# Patient Record
Sex: Male | Born: 1970
Health system: Southern US, Community
[De-identification: ages and names within clinical notes are randomized; demographics above are authoritative.]

## PROBLEM LIST (undated history)

## (undated) DIAGNOSIS — E119 Type 2 diabetes mellitus without complications: Secondary | ICD-10-CM

## (undated) DIAGNOSIS — F419 Anxiety disorder, unspecified: Secondary | ICD-10-CM

## (undated) DIAGNOSIS — M79606 Pain in leg, unspecified: Secondary | ICD-10-CM

## (undated) DIAGNOSIS — M549 Dorsalgia, unspecified: Secondary | ICD-10-CM

## (undated) DIAGNOSIS — K219 Gastro-esophageal reflux disease without esophagitis: Secondary | ICD-10-CM

## (undated) DIAGNOSIS — M1990 Unspecified osteoarthritis, unspecified site: Secondary | ICD-10-CM

## (undated) DIAGNOSIS — I10 Essential (primary) hypertension: Secondary | ICD-10-CM

## (undated) HISTORY — DX: Type 2 diabetes mellitus without complications: E11.9

## (undated) HISTORY — DX: Gastro-esophageal reflux disease without esophagitis: K21.9

## (undated) HISTORY — PX: VASECTOMY: SHX75

## (undated) HISTORY — DX: Dorsalgia, unspecified: M54.9

## (undated) HISTORY — DX: Anxiety disorder, unspecified: F41.9

---
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
## 1998-02-02 ENCOUNTER — Encounter: Payer: Self-pay | Admitting: Emergency Medicine

## 1998-02-02 ENCOUNTER — Inpatient Hospital Stay (HOSPITAL_COMMUNITY): Admission: EM | Admit: 1998-02-02 | Discharge: 1998-02-03 | Payer: Self-pay | Admitting: Emergency Medicine

## 1998-02-08 ENCOUNTER — Emergency Department (HOSPITAL_COMMUNITY): Admission: EM | Admit: 1998-02-08 | Discharge: 1998-02-08 | Payer: Self-pay | Admitting: Emergency Medicine

## 2011-03-26 ENCOUNTER — Ambulatory Visit (INDEPENDENT_AMBULATORY_CARE_PROVIDER_SITE_OTHER): Payer: BC Managed Care – PPO

## 2011-03-26 DIAGNOSIS — J069 Acute upper respiratory infection, unspecified: Secondary | ICD-10-CM

## 2011-03-26 DIAGNOSIS — B9789 Other viral agents as the cause of diseases classified elsewhere: Secondary | ICD-10-CM

## 2011-03-26 DIAGNOSIS — Z23 Encounter for immunization: Secondary | ICD-10-CM

## 2011-05-19 ENCOUNTER — Ambulatory Visit (INDEPENDENT_AMBULATORY_CARE_PROVIDER_SITE_OTHER): Payer: BC Managed Care – PPO | Admitting: Family Medicine

## 2011-05-19 VITALS — BP 138/88 | HR 78 | Temp 98.6°F | Resp 16 | Ht 64.5 in | Wt 192.0 lb

## 2011-05-19 DIAGNOSIS — H1030 Unspecified acute conjunctivitis, unspecified eye: Secondary | ICD-10-CM

## 2011-05-19 DIAGNOSIS — H00019 Hordeolum externum unspecified eye, unspecified eyelid: Secondary | ICD-10-CM

## 2011-05-19 MED ORDER — ERYTHROMYCIN 5 MG/GM OP OINT: TOPICAL_OINTMENT | Freq: Four times a day (QID) | OPHTHALMIC | Status: AC

## 2011-05-19 NOTE — Patient Instructions (Signed)
Sty  A sty (hordeolum) is an infection of a gland in the eyelid located at the base of the eyelash. A sty may develop a white or yellow head of pus. It can be puffy (swollen). Usually, the sty will burst and pus will come out on its own. They do not leave lumps in the eyelid once they drain.  A sty is often confused with another form of cyst of the eyelid called a chalazion. Chalazions occur within the eyelid and not on the edge where the bases of the eyelashes are. They often are red, sore and then form firm lumps in the eyelid.  CAUSES    Germs (bacteria).   Lasting (chronic) eyelid inflammation.  SYMPTOMS    Tenderness, redness and swelling along the edge of the eyelid at the base of the eyelashes.   Sometimes, there is a white or yellow head of pus. It may or may not drain.  DIAGNOSIS   An ophthalmologist will be able to distinguish between a sty and a chalazion and treat the condition appropriately.   TREATMENT    Styes are typically treated with warm packs (compresses) until drainage occurs.   In rare cases, medicines that kill germs (antibiotics) may be prescribed. These antibiotics may be in the form of drops, cream or pills.   If a hard lump has formed, it is generally necessary to do a small incision and remove the hardened contents of the cyst in a minor surgical procedure done in the office.   In suspicious cases, your caregiver may send the contents of the cyst to the lab to be certain that it is not a rare, but dangerous form of cancer of the glands of the eyelid.  HOME CARE INSTRUCTIONS    Wash your hands often and dry them with a clean towel. Avoid touching your eyelid. This may spread the infection to other parts of the eye.   Apply heat to your eyelid for 10 to 20 minutes, several times a day, to ease pain and help to heal it faster.   Do not squeeze the sty. Allow it to drain on its own. Wash your eyelid carefully 3 to 4 times per day to remove any pus.  SEEK IMMEDIATE MEDICAL CARE IF:     Your eye becomes painful or puffy (swollen).   Your vision changes.   Your sty does not drain by itself within 3 days.   Your sty comes back within a short period of time, even with treatment.   You have redness (inflammation) around the eye.   You have a fever.  Document Released: 12/12/2004 Document Revised: 02/21/2011 Document Reviewed: 08/16/2008  [employer] Patient Information 2012 [employer]

## 2011-05-19 NOTE — Progress Notes (Signed)
  Subjective:    Patient ID: Isaac Hall, male    DOB: [birth year], 41 y.o.   MRN: [id number]  HPI Isaac Hall is a 41 y.o. male Started 4 days ago - R eye pain, swollen in am each day since.  Swelling improves during day.  Minimal watering in am.  No fever.  Slight headache.  No visual changes.    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 No prior hx of FB.  Tx: otc ointment for stye   Review of Systems  Eyes: Positive for pain and redness. Negative for photophobia, discharge and visual disturbance.       No amaurosis, no change in floaters.  Neurological: Positive for headaches.       Objective:   Physical Exam  Nursing note and vitals reviewed. Constitutional: He appears well-developed and well-nourished. No distress.  HENT:  Head: Normocephalic and atraumatic.  Eyes: EOM are normal. Pupils are equal, round, and reactive to light. No foreign bodies found. Right eye exhibits hordeolum. Right conjunctiva is injected. Left conjunctiva is not injected.       Minimal scleral injection on right, small pterygium at edge of cornea.  erythematous hordeolum on medial aspect R upper lid.  Proparacaine 2 gtts applied, lid everted, no fb.  No uptake with fluorescein, flushed with saline.   Pulmonary/Chest: Effort normal.  Skin: Skin is warm and dry.  Psychiatric: He has a normal mood and affect. His behavior is normal.        Assessment & Plan:   Isaac Hall is a 41 y.o. male With R upper lid hordeolum.  Minimal injection - reactive conjunctivitis vs. Irritative with rubbing.   Warm compresses QID +.  Eryhthromycin oint for next 5-7 days,  Small pterygium - has optho, s/p eval few months ago.  Stressed importance of sunglasses.

## 2011-08-10 ENCOUNTER — Ambulatory Visit (INDEPENDENT_AMBULATORY_CARE_PROVIDER_SITE_OTHER): Payer: BC Managed Care – PPO | Admitting: Family Medicine

## 2011-08-10 VITALS — BP 120/73 | HR 67 | Temp 98.1°F | Resp 16 | Ht 64.5 in | Wt 189.0 lb

## 2011-08-10 DIAGNOSIS — S39012A Strain of muscle, fascia and tendon of lower back, initial encounter: Secondary | ICD-10-CM

## 2011-08-10 DIAGNOSIS — M62838 Other muscle spasm: Secondary | ICD-10-CM

## 2011-08-10 DIAGNOSIS — IMO0002 Reserved for concepts with insufficient information to code with codable children: Secondary | ICD-10-CM

## 2011-08-10 DIAGNOSIS — M545 Low back pain, unspecified: Secondary | ICD-10-CM

## 2011-08-10 MED ORDER — NAPROXEN 500 MG PO TABS: 500.0000 mg | ORAL_TABLET | Freq: Two times a day (BID) | ORAL | Status: DC

## 2011-08-10 MED ORDER — PREDNISONE 20 MG PO TABS: ORAL_TABLET | ORAL | Status: AC

## 2011-08-10 MED ORDER — CYCLOBENZAPRINE HCL 10 MG PO TABS: 10.0000 mg | ORAL_TABLET | Freq: Three times a day (TID) | ORAL | Status: AC | PRN

## 2011-08-10 NOTE — Progress Notes (Signed)
  Subjective:    Patient ID: Isaac Hall, male    DOB: Aug 08, 1970, 41 y.o.   MRN: [id number]  HPI 41 yo male here with LBP.  Does a lot of physical work at his job, at work yesterday started hurting.  Hard to get comfortable last night.  Hurts in middle of back.  No radiation.  Worse standing up straight or moving too fast. No numbness, tingling, weakness.  No history of back problems.  Took ibuprofen and wife's muscle relaxer.  Helped some.     Review of Systems Negative except as per HPI     Objective:   Physical Exam  Constitutional: Vital signs are normal. He appears well-developed and well-nourished. He is active.  Non-toxic appearance. He does not appear ill.  Cardiovascular: Normal rate, regular rhythm, normal heart sounds and normal pulses.   Pulmonary/Chest: Effort normal and breath sounds normal.  Musculoskeletal:       Lumbar back: He exhibits decreased range of motion, tenderness and spasm. He exhibits no bony tenderness, no swelling and no deformity.       TTP over right lumbar paraspinal    Neurological: He is alert. He has normal strength. Gait normal.  Reflex Scores:      Patellar reflexes are 2+ on the right side and 2+ on the left side.      Achilles reflexes are 2+ on the right side and 2+ on the left side.         Assessment & Plan:  LBP Back strain Muscle spasm  Flexeril, Naproxen, prednisone taper.  Heat, ice.  NO lifting, bending over weekend but try to do gentle walking around.

## 2011-10-14 ENCOUNTER — Ambulatory Visit (INDEPENDENT_AMBULATORY_CARE_PROVIDER_SITE_OTHER): Payer: Self-pay | Admitting: Family Medicine

## 2011-10-14 ENCOUNTER — Ambulatory Visit: Payer: BC Managed Care – PPO

## 2011-10-14 VITALS — BP 128/92 | HR 80 | Temp 98.3°F | Resp 16 | Ht 64.5 in | Wt 190.0 lb

## 2011-10-14 DIAGNOSIS — M549 Dorsalgia, unspecified: Secondary | ICD-10-CM

## 2011-10-14 DIAGNOSIS — R5381 Other malaise: Secondary | ICD-10-CM

## 2011-10-14 DIAGNOSIS — M419 Scoliosis, unspecified: Secondary | ICD-10-CM

## 2011-10-14 DIAGNOSIS — M5416 Radiculopathy, lumbar region: Secondary | ICD-10-CM

## 2011-10-14 DIAGNOSIS — R5383 Other fatigue: Secondary | ICD-10-CM

## 2011-10-14 DIAGNOSIS — IMO0002 Reserved for concepts with insufficient information to code with codable children: Secondary | ICD-10-CM

## 2011-10-14 LAB — POCT CBC
HCT, POC: 48.8 % (ref 43.5–53.7)
Hemoglobin: 16.1 g/dL (ref 14.1–18.1)
MCH, POC: 29.9 pg (ref 27–31.2)
MPV: 10.4 fL (ref 0–99.8)
POC MID %: 6.5 %M (ref 0–12)
RBC: 5.38 M/uL (ref 4.69–6.13)
WBC: 6.1 10*3/uL (ref 4.6–10.2)

## 2011-10-14 LAB — COMPREHENSIVE METABOLIC PANEL
ALT: 64 U/L — ABNORMAL HIGH (ref 0–53)
AST: 34 U/L (ref 0–37)
Albumin: 4.7 g/dL (ref 3.5–5.2)
BUN: 12 mg/dL (ref 6–23)
Calcium: 9.8 mg/dL (ref 8.4–10.5)
Chloride: 104 mEq/L (ref 96–112)
Potassium: 4.5 mEq/L (ref 3.5–5.3)

## 2011-10-14 LAB — TESTOSTERONE: Testosterone: 196.16 ng/dL — ABNORMAL LOW (ref 300–890)

## 2011-10-14 LAB — LIPID PANEL: HDL: 29 mg/dL — ABNORMAL LOW (ref >39)

## 2011-10-14 MED ORDER — PREDNISONE 20 MG PO TABS: ORAL_TABLET | ORAL | Status: AC

## 2011-10-14 MED ORDER — HYDROCODONE-ACETAMINOPHEN 5-500 MG PO TABS: 1.0000 | ORAL_TABLET | ORAL | Status: AC | PRN

## 2011-10-14 NOTE — Progress Notes (Signed)
Subjective: 41 year old man who has a history of being treated earlier this spring for back pain. A few days ago it began in. He has not any idea of what the injury might have been. It hurts in his low back, down into his left buttock and down his left leg. 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 However he tries to do proper lifting. He does not have any history of curvature of his spine. He has been having a lot more fatigued. He just doesn't have the energy used 2. Tired all the time.Marland Kitchen He says maybe gets a little bit depressed at times. His wife does have MS. The sexual function is not what it used to be, upon her is having children and having a wife is not very able. The fatigue is nonspecific.  Objective: Short stature, moderately overweight, male in no acute distress. Throat clear. Neck supple without nodes or thyromegaly. Chest clear to auscultation. Heart regular without murmurs. Abdomen soft without mass or tenderness. Straight leg raising test positive at 40 on the left. He is spine is very limited motion, with a great deal of pain with any anterior flexion. There is asymmetry of the skin folds in his back, with a little scoliosis toward the left. This appears to be at the very lower lumbar area.  Assessment: Low back pain Lumbar radiculopathy Fatigue  Plan: Check labs because of his fatigue Results for orders placed in visit on 10/14/11  POCT CBC      Component Value Range   WBC 6.1  4.6 - 10.2 K/uL   Lymph, poc 1.6  0.6 - 3.4   POC LYMPH PERCENT 26.5  10 - 50 %L   MID (cbc) 0.4  0 - 0.9   POC MID % 6.5  0 - 12 %M   POC Granulocyte 4.1  2 - 6.9   Granulocyte percent 67.0  37 - 80 %G   RBC 5.38  4.69 - 6.13 M/uL   Hemoglobin 16.1  14.1 - 18.1 g/dL   HCT, POC 16.1  09.6 - 53.7 %   MCV 90.7  80 - 97 fL   MCH, POC 29.9  27 - 31.2 pg   MCHC 33.0  31.8 - 35.4 g/dL   RDW, POC 04.5     Platelet Count, POC 210  142 - 424 K/uL   MPV 10.4  0 - 99.8 fL  POCT GLYCOSYLATED  HEMOGLOBIN (HGB A1C)      Component Value Range   Hemoglobin A1C 6.1     Results for orders placed in visit on 10/14/11  POCT CBC      Component Value Range   WBC 6.1  4.6 - 10.2 K/uL   Lymph, poc 1.6  0.6 - 3.4   POC LYMPH PERCENT 26.5  10 - 50 %L   MID (cbc) 0.4  0 - 0.9   POC MID % 6.5  0 - 12 %M   POC Granulocyte 4.1  2 - 6.9   Granulocyte percent 67.0  37 - 80 %G   RBC 5.38  4.69 - 6.13 M/uL   Hemoglobin 16.1  14.1 - 18.1 g/dL   HCT, POC 40.9  81.1 - 53.7 %   MCV 90.7  80 - 97 fL   MCH, POC 29.9  27 - 31.2 pg   MCHC 33.0  31.8 - 35.4 g/dL   RDW, POC 91.4     Platelet Count, POC 210  142 - 424 K/uL  MPV 10.4  0 - 99.8 fL  POCT GLYCOSYLATED HEMOGLOBIN (HGB A1C)      Component Value Range   Hemoglobin A1C 6.1     [facility] reading (PRIMARY) by  Dr. Alwyn Ren Normal spine  Impression: Fatigue Pre-diabetes Lumbar radiculopathy.

## 2011-10-14 NOTE — Patient Instructions (Addendum)
Use the pain pills for severe pain only. Take the muscle relaxants pill at bedtime Take the prednisone as directed. If pain in the back continues to persist with radiation down the left leg please return because we would need to get additional testing done.  Weight loss and exercise

## 2011-10-15 ENCOUNTER — Encounter: Payer: Self-pay | Admitting: Family Medicine

## 2011-10-15 LAB — TSH: TSH: 2.627 u[IU]/mL (ref 0.350–4.500)

## 2012-04-23 ENCOUNTER — Ambulatory Visit (INDEPENDENT_AMBULATORY_CARE_PROVIDER_SITE_OTHER): Payer: BC Managed Care – PPO | Admitting: Physician Assistant

## 2012-04-23 VITALS — BP 127/82 | HR 84 | Temp 97.8°F | Resp 16 | Ht 65.5 in | Wt 192.6 lb

## 2012-04-23 DIAGNOSIS — J069 Acute upper respiratory infection, unspecified: Secondary | ICD-10-CM

## 2012-04-23 DIAGNOSIS — J111 Influenza due to unidentified influenza virus with other respiratory manifestations: Secondary | ICD-10-CM

## 2012-04-23 DIAGNOSIS — R6889 Other general symptoms and signs: Secondary | ICD-10-CM

## 2012-04-23 LAB — POCT INFLUENZA A/B: Influenza B, POC: NEGATIVE

## 2012-04-23 MED ORDER — HYDROCOD POLST-CHLORPHEN POLST 10-8 MG/5ML PO LQCR: 5.0000 mL | Freq: Two times a day (BID) | ORAL | Status: DC

## 2012-04-23 MED ORDER — GUAIFENESIN ER 1200 MG PO TB12: 1.0000 | ORAL_TABLET | Freq: Two times a day (BID) | ORAL | Status: DC

## 2012-04-23 NOTE — Progress Notes (Signed)
   [address] [place] [address]   Phone [phone]  Subjective:    Patient ID: Isaac Hall, male    DOB: May 06, 1970, 42 y.o.   MRN: [id number]  HPI Pt presents to clinic with 4 day h/o flu like symptoms.  Has been around a lot of sick people at work and he did not get the flu vaccine.  He has been using OTC cold preps but not helping much.     Review of Systems  Constitutional: Positive for chills and fatigue (subjective).  HENT: Positive for congestion, sore throat, rhinorrhea (clear) and postnasal drip.   Respiratory: Positive for cough. Negative for shortness of breath.   Gastrointestinal: Negative for nausea, vomiting and diarrhea.  Musculoskeletal: Positive for myalgias.  Neurological: Positive for headaches.       Objective:   Physical Exam  Vitals reviewed. Constitutional: He is oriented to person, place, and time. He appears well-developed and well-nourished.  HENT:  Head: Normocephalic and atraumatic.  Right Ear: Hearing, tympanic membrane, external ear and ear canal normal.  Left Ear: Hearing, tympanic membrane, external ear and ear canal normal.  Nose: Nose normal.  Mouth/Throat: Uvula is midline, oropharynx is clear and moist and mucous membranes are normal.  Eyes: Conjunctivae normal are normal.  Cardiovascular: Normal rate, regular rhythm and normal heart sounds.  Exam reveals no gallop.   No murmur heard. Pulmonary/Chest: Effort normal. He has no wheezes.  Lymphadenopathy:    He has cervical adenopathy (AC R>L, supraclavicular node on the R palpable).  Neurological: He is alert and oriented to person, place, and time.  Skin: Skin is warm and dry.  Psychiatric: He has a normal mood and affect. His behavior is normal. Judgment and thought content normal.   Results for orders placed in visit on 04/23/12  POCT INFLUENZA A/B      Component Value Range   Influenza A, POC Negative     Influenza B, POC Negative         Assessment & Plan:   1.  Flu-like symptoms  POCT Influenza A/B  2. URI (upper respiratory infection)  Guaifenesin (MUCINEX MAXIMUM STRENGTH) 1200 MG TB12, chlorpheniramine-HYDROcodone (TUSSIONEX PENNKINETIC ER) 10-8 MG/5ML LQCR   Pt to push fluids.  Tylenol/motrin prn.  Ok to work as long as fever free for 24h

## 2012-05-16 ENCOUNTER — Ambulatory Visit (INDEPENDENT_AMBULATORY_CARE_PROVIDER_SITE_OTHER): Payer: BC Managed Care – PPO | Admitting: Internal Medicine

## 2012-05-16 VITALS — BP 123/80 | HR 71 | Temp 98.3°F | Resp 18 | Ht 65.0 in | Wt 188.8 lb

## 2012-05-16 DIAGNOSIS — Z719 Counseling, unspecified: Secondary | ICD-10-CM

## 2012-05-16 DIAGNOSIS — Z7189 Other specified counseling: Secondary | ICD-10-CM

## 2012-05-16 DIAGNOSIS — K921 Melena: Secondary | ICD-10-CM

## 2012-05-16 DIAGNOSIS — Z0000 Encounter for general adult medical examination without abnormal findings: Secondary | ICD-10-CM

## 2012-05-16 DIAGNOSIS — Z8639 Personal history of other endocrine, nutritional and metabolic disease: Secondary | ICD-10-CM

## 2012-05-16 DIAGNOSIS — Z862 Personal history of diseases of the blood and blood-forming organs and certain disorders involving the immune mechanism: Secondary | ICD-10-CM

## 2012-05-16 DIAGNOSIS — Z23 Encounter for immunization: Secondary | ICD-10-CM

## 2012-05-16 LAB — LIPID PANEL
HDL: 30 mg/dL — ABNORMAL LOW (ref >39)
LDL Cholesterol: 88 mg/dL (ref 0–99)
Triglycerides: 109 mg/dL (ref <150)
VLDL: 22 mg/dL (ref 0–40)

## 2012-05-16 LAB — COMPREHENSIVE METABOLIC PANEL
ALT: 64 U/L — ABNORMAL HIGH (ref 0–53)
AST: 34 U/L (ref 0–37)
CO2: 26 mEq/L (ref 19–32)
Calcium: 9.3 mg/dL (ref 8.4–10.5)
Chloride: 105 mEq/L (ref 96–112)
Sodium: 140 mEq/L (ref 135–145)
Total Protein: 7 g/dL (ref 6.0–8.3)

## 2012-05-16 LAB — POCT URINALYSIS DIPSTICK
Bilirubin, UA: NEGATIVE
Glucose, UA: NEGATIVE
Ketones, UA: NEGATIVE
Leukocytes, UA: NEGATIVE
Nitrite, UA: NEGATIVE

## 2012-05-16 LAB — IFOBT (OCCULT BLOOD): IFOBT: POSITIVE

## 2012-05-16 LAB — TSH: TSH: 1.521 u[IU]/mL (ref 0.350–4.500)

## 2012-05-16 LAB — POCT GLYCOSYLATED HEMOGLOBIN (HGB A1C): Hemoglobin A1C: 6.1

## 2012-05-16 NOTE — Patient Instructions (Addendum)
Dieta de 1800 caloras para el plan de alimentacin para la diabetes  (1800 Calorie Diet for Diabetes Meal Planning)  Esta dieta ha sido diseada para comer hasta 1800 caloras cada da. Si sigue esta dieta y elije comidas saludables podr Probation officer en general. Esta dieta controla los niveles de azcar en la sangre (glucosa) y tambin puede ayudar a disminuir la presin arterial y Print production planner.  TAMAO DE LAS PORCIONES:  La medicin de los alimentos y el tamao de las porciones lo ayudar a Scientist, physiological cantidad exacta de comida que debe ingerir. La lista que sigue le mostrar el tamao de algunas porciones comunes.   1 onzas (28 gr)........4 dados apilados.  3 onzas (85 gr) .Marland KitchenMarland KitchenMarland KitchenMarland Kitchen1 mazo de cartas.  1 cucharadita.......... la punta del dedo Falman.  1 cucharada.............el pulgar.  2 cucharadas...........una pelota de golf.   taza      ..la mitad de un puo.  1 taza       un puo. GUA PARA LA ELECCIN DE LOS ALIMENTOS  El objetivo de esta dieta es consumir alimentos variados y Film/video editor la cantidad de caloras a 1800 por Futures trader. Esto puede lograrse eligiendo los alimentos bajos en caloras y en grasas. La dieta tambin aconseja consumir con frecuencia porciones pequeas de alimentos. Esto ayuda a [employer] de glucosa en la sangre de modo que no suba o baje demasiado. Cada comida o colacin puede incluir un alimento que sea fuente de protenas para que lo ayude a sentirse ms satisfecho y que se estabilice su nivel de glucosa en la sangre. Trate de consumir aproximadamente la misma cantidad de alimentos a la [employer]. Hgalo tambin [employer] fines de [place], cuando viaje y [address] en que no trabaje. Separe sus comidas con una diferencia de 4 a 5 horas y agregue una [employer], si lo desea.  Por ejemplo, un plan de alimentacin diaria podra incluir el desayuno, una colacin por la maana, el almuerzo, la cena y una colacin por la noche. Las  comidas y colaciones saludables deben incluir granos enteros, verduras, frutas, carnes magras, aves, pescado y productos lcteos. Al planificar sus comidas, seleccione alimentos variados. Elija [employer] de panes y [place], vegetales, frutas, productos lcteos y carnes o protenas. A continuacin se dan algunos ejemplos de alimentos de cada grupo y se recomienda el tamao de las porciones. Use tazas y cucharas de medir para familiarizarse con la medida de una porcin saludable.  Panes y fculas Cada porcin equivale a 15 gramos de carbohidratos.   1 rebanada de pan.   de bagel.   de taza de cereal fro (sin azcar).   taza de cereal caliente o pur de papas.  1 papa pequea (tamao de un mouse de ordenador).   taza de pasta cocida o arroz.   muffin ingls.  1 taza de sopa a base de caldo.  3 tazas de palomitas de maz.  4 a 6 galletas de trigo integral.   taza de frijoles, guisantes o maz cocidos. Vegetales Cada porcin equivale a 5 gramos de carbohidratos.   taza de vegetales cocidos.  1 taza de vegetales crudos.   taza de jugo de tomate o verduras. Frutas Cada porcin equivale a 15 gramos de carbohidratos.  1 manzana o naranja pequea.  1  taza de sanda o fresas.   taza de pur de manzana (sin aadir azcar).  2 cucharadas de pasas.   banana.   taza de fruta enlatada, envasadas  en agua, en su propio jugo o endulzada con un sustituto del azcar.   taza de jugo de fruta sin azcar. Lcteos: Cada porcin equivale a 7 a 15 gramos de carbohidratos.   1 taza de [employer].  6 oz (170 g) de yogur endulzado artificialmente o yogur natural.  1 taza de ricota baja en grasa.  1 taza de leche de soja.  1 taza de leche de [place]. Carnes/protenas  1 huevo grande.  2 a 3 onzas (56 a 85 g) de carne, aves o pescado.   de taza de queso cottage con bajo contenido de [place].  1 cucharada de [place] de man.  1 oz queso bajo en  grasas.   de taza de atn en agua.   taza de tofu. Grasas:  1 cucharadita de aceite.  1 cucharadita de margarina sin grasa trans.  1 cucharadita de mantequilla.  1 cucharadita de mayonesa.  2 cucharadas de aguacate.  1 cucharada de aderezo para ensaladas.  1 cucharada de queso crema.  2 cucharadas de crema agria. EJEMPLO DE DIETA DE 1800 CALORAS  Desayuno   de taza de cereal sin azcar (1 porcin de carbohidratos).  1 taza de [employer] (1 porcin de carbohidratos).  1 rebanada de pan integral tostado (1 porcin de carbohidratos).   banana pequea (1 porcin de carbohidratos).  1 huevo revuelto.  1 cucharadita de margarina sin grasa trans. Almuerzo:  [place] de atn.  2 rebanadas de pan integral (2 porciones de carbohidratos).   taza de atn [employer], escurrido.  1 cucharada de mayonesa baja en grasa.  1 tallo de apio, picado.  2 rebanadas de tomate.  1 hoja de Company secretary.  1 taza de bastones de zanahorias.  24 a 30 uvas sin semillas (2 porciones de carbohidratos).  6 oz (170 g) de yogur light (1 porcin de carbohidratos). Colacin de media tarde  3 galletas graham (1 porcin de carbohidratos).  Leche descremada, 1 taza (1 porcin de carbohidratos).  1 cucharada de [place] de man. Cena  3 oz (80 gr) de salmn a la parrilla con 1 cucharadita de aceite.  1 taza de pur de papas (2 porciones de carbohidratos) con 1 cucharadita de margarina sin grasa trans.  1 taza de judas verdes frescas o congeladas.  1 taza.  1 taza de [employer] (1 porcin de carbohidratos). Colacin de la noche  3 tazas de palomitas de maz (1 porcin de carbohidratos).  con 2 cucharadas de queso parmesano. PLAN DE COMIDAS  Puede utilizar esta hoja para realizar su plan de comidas basndose en las indicaciones para la dieta de 1800 caloras. Si usted est [employer] plan para ayudar a Chief Operating Officer su glucosa en la sangre, es posible  intercambiar alimentos que contienen carbohidratos (lcteos, fculas y frutas). Seleccione una variedad de alimentos frescos de diferentes colores y sabores. La cantidad total de carbohidratos en sus comidas o meriendas es ms importante que asegurarse de [employer] grupos de alimentos cada vez que come. Elija entre los siguientes alimentos para elaborar las comidas del da:   8 porciones de fculas.  4 porciones de vegetales.  3 porciones de frutas.  2 porciones de lcteos.  6 a 7 oz (168 a 196 g) de carne / protenas.  Hasta 4 porciones de grasas. Su dietista puede utilizar esta hoja de [place] para ayudarle a decidir cuntas porciones y los tipos de alimentos que son perfectos para usted.  DESAYUNO Grupo de alimentos y porciones /eleccin de los  alimentos Fculas ________________________________________________________  Sheppard Penton _________________________________________________________  Nils Pyle _________________________________________________________  Carnes/protenas ________________________________________________________  Rosalin Hawking __________________________________________________________  Lorin Mercy de alimentos y porciones Fculas ________________________________________________________  Carnes/protenas ________________________________________________________  Rufina Falco ________________________________________________________  Lou Miner _________________________________________________________  Sheppard Penton _________________________________________________________  Rosalin Hawking __________________________________________________________  Waldo Laine DE LA NOCHE Grupo de alimentos y porciones Fculas ________________________________________________________  Carnes/protenas __________________________________________________  Nils Pyle _________________________________________________________  Sheppard Penton _________________________________________________________  Salli Quarry de alimentos y  porciones Fculas ________________________________________________________  Carnes/protenas __________________________________________________  Sheppard Penton _________________________________________________________  Vegetales ________________________________________________________  Lou Miner _________________________________________________________  Rosalin Hawking __________________________________________________________  Waldo Laine DE LA NOCHE Grupo de alimentos y porciones /eleccin de los alimentos Frutas _________________________________________________________  Carnes/protenas __________________________________________________  Sheppard Penton _________________________________________________________  Karl Pock ________________________________________________________  Valorie Roosevelt ____________________________  Hoover Brunette _________________________  Nils Pyle _____________________________  Lcteos_____________________________  Carnes/protenas______________________  Rosalin Hawking _______________________________  Document Released: 06/19/2006 Document Revised: 05/27/2011 Exit[employer] Patient Information 2013 [place]. Diabetes and Exercise Regular exercise is important and can help:   Control blood glucose (sugar).  Decrease blood pressure.    Control blood lipids (cholesterol, triglycerides).  Improve overall health. BENEFITS FROM EXERCISE  Improved fitness.  Improved flexibility.  Improved endurance.  Increased bone density.  Weight control.  Increased muscle strength.  Decreased body fat.  Improvement of the body's use of insulin, a hormone.  Increased insulin sensitivity.  Reduction of insulin needs.  Reduced stress and tension.  Helps you feel better. People with diabetes who add exercise to their lifestyle gain additional benefits, including:  Weight loss.  Reduced appetite.  Improvement of the body's use of blood glucose.  Decreased risk factors for heart  disease:  Lowering of cholesterol and triglycerides.  Raising the level of good cholesterol (high-density lipoproteins, HDL).  Lowering blood sugar.  Decreased blood pressure. TYPE 1 DIABETES AND EXERCISE  Exercise will usually lower your blood glucose.  If blood glucose is greater than 240 mg/dl, check urine ketones. If ketones are present, do not exercise.  Location of the insulin injection sites may need to be adjusted with exercise. Avoid injecting insulin into areas of the body that will be exercised. For example, avoid injecting insulin into:  The arms when playing tennis.  The legs when jogging. For more information, discuss this with your caregiver.  Keep a record of:  Food intake.  Type and amount of exercise.  Expected peak times of insulin action.  Blood glucose levels. Do this before, during, and after exercise. Review your records with your caregiver. This will help you to develop guidelines for adjusting food intake and insulin amounts.  TYPE 2 DIABETES AND EXERCISE  Regular physical activity can help control blood glucose.  Exercise is important because it may:  Increase the body's sensitivity to insulin.  Improve blood glucose control.  Exercise reduces the risk of heart disease. It decreases serum cholesterol and triglycerides. It also lowers blood pressure.  Those who take insulin or oral hypoglycemic agents should watch for signs of hypoglycemia. These signs include dizziness, shaking, sweating, chills, and confusion.  Body water is lost during exercise. It must be replaced. This will help to avoid loss of body fluids (dehydration) or heat stroke. Be sure to talk to your caregiver before starting an exercise program to make sure it is safe for you. Remember, any activity is better than none.  Document Released: 05/25/2003 Document Revised: 05/27/2011 Document Reviewed: 09/08/2008 [facility] Patient Information 2013 [place], [place]. DASH Diet The DASH  diet stands for "Dietary Approaches to Stop Hypertension." It is a healthy eating plan that has been shown to reduce high blood pressure (hypertension) in as little  as 14 days, while also possibly providing other significant health benefits. These other health benefits include reducing the risk of breast cancer after menopause and reducing the risk of type 2 diabetes, heart disease, colon cancer, and stroke. Health benefits also include weight loss and slowing kidney failure in patients with chronic kidney disease.  DIET GUIDELINES  Limit salt (sodium). Your diet should contain less than 1500 mg of sodium daily.  Limit refined or processed carbohydrates. Your diet should include mostly whole grains. Desserts and added sugars should be used sparingly.  Include small amounts of heart-healthy fats. These types of fats include nuts, oils, and tub margarine. Limit saturated and trans fats. These fats have been shown to be harmful in the body. CHOOSING FOODS  The following food groups are based on a 2000 calorie diet. See your Registered Dietitian for individual calorie needs. Grains and Grain Products (6 to 8 servings daily)  Eat More Often: Whole-wheat bread, brown rice, whole-grain or wheat pasta, quinoa, popcorn without added fat or salt (air popped).  Eat Less Often: White bread, white pasta, white rice, cornbread. Vegetables (4 to 5 servings daily)  Eat More Often: Fresh, frozen, and canned vegetables. Vegetables may be raw, steamed, roasted, or grilled with a minimal amount of fat.  Eat Less Often/Avoid: Creamed or fried vegetables. Vegetables in a cheese sauce. Fruit (4 to 5 servings daily)  Eat More Often: All fresh, canned (in natural juice), or frozen fruits. Dried fruits without added sugar. One hundred percent fruit juice ( cup [237 mL] daily).  Eat Less Often: Dried fruits with added sugar. Canned fruit in light or heavy syrup. [employer], Fish, and Poultry (2 servings or less  daily. One serving is 3 to 4 oz [85-114 g]).  Eat More Often: Ninety percent or leaner ground beef, tenderloin, sirloin. Round cuts of beef, chicken breast, [place] breast. All fish. Grill, bake, or broil your meat. Nothing should be fried.  Eat Less Often/Avoid: Fatty cuts of meat, [place], or chicken leg, thigh, or wing. Fried cuts of meat or fish. Dairy (2 to 3 servings)  Eat More Often: Low-fat or fat-free milk, low-fat plain or light yogurt, reduced-fat or part-skim cheese.  Eat Less Often/Avoid: Milk (whole, 2%).Whole milk yogurt. Full-fat cheeses. Nuts, Seeds, and Legumes (4 to 5 servings per week)  Eat More Often: All without added salt.  Eat Less Often/Avoid: Salted nuts and seeds, canned beans with added salt. Fats and Sweets (limited)  Eat More Often: Vegetable oils, tub margarines without trans fats, sugar-free gelatin. Mayonnaise and salad dressings.  Eat Less Often/Avoid: Coconut oils, palm oils, butter, stick margarine, cream, half and half, cookies, candy, pie. FOR MORE INFORMATION The Dash Diet Eating Plan: www.dashdiet.org Document Released: 02/21/2011 Document Revised: 05/27/2011 Document Reviewed: 02/21/2011 [facility] Patient Information 2013 [place], [place]. Fecal Occult Blood Test This is a test done on a stool specimen to screen for gastrointestinal bleeding, which may be an indicator of colon cancer Is is usually done as part of a routine examination, annually, after age 55 or as directed by your caregiver. The fecal occult blood test (FOBT) checks for blood in your stool. Normally, there will not be enough blood lost through the gastrointestinal tract to turn an FOBT positive or for you to notice it visually in the form of bloody or dark, tarry stools. Any significant amount of blood being passed should be investigated.  A positive FOBT will tell your caregiver that you have bleeding occurring somewhere in your gastrointestinal tract.  This blood loss could be due to  ulcers, diverticulosis, bleeding polyps, inflammatory bowel disease, hemorrhoids, from swallowed blood due to bleeding gums or nosebleeds, or it could be due to benign or cancerous tumors. Anything that protrudes into the lumen (the empty space in the intestine), like a polyp or tumor, and is rubbed against by the fecal waste as it passes through has the potential to eventually bleed intermittently. Often this small amount of blood is the first, and sometimes the only, symptom of early colon cancer, making the FOBT a valuable screening tool. PREPARATION FOR TEST  You should not eat red meat within three days before testing. Other substances that could cause a false positive test result include fish, turnips, horseradish, and drugs such as colchicines and oxidizing drugs (for example, iodine and boric acid). Be sure to carefully follow your caregiver's instructions. With FOBT, your caregiver or laboratory will give you one or more test "cards." You collect a separate sample from three different stools, usually on consecutive days. Each stool sample should be collected into a clean container and should not be contaminated with urine or water. The slide is labeled with your name and the date; then, with an applicator stick, you apply a thin smear of stool onto each filter paper square/window contained on the card. Allow the filter paper to dry. Once it is dry, it is stable. Usually you will collect all of the consecutive samples, and then return all of them to your caregiver or laboratory at the same time, sometimes by mailing them. There are also over the counter tests which are dropped in your toilet. NORMAL FINDINGS   No occult blood within the stool.  The FOBT test is normally negative. A positive indicates either blood in the stool or an interfering substance. Multiple samples are done to: 1) catch intermittent bleeding; and 2) help rule out false positives. Ranges for normal findings may vary among  different laboratories and hospitals. You should always check with your doctor after having lab work or other tests done to discuss the meaning of your test results and whether your values are considered within normal limits. MEANING OF TEST  Your caregiver will go over the test results with you and discuss the importance and meaning of your results, as well as treatment options and the need for additional tests if necessary. OBTAINING THE TEST RESULTS  It is your responsibility to obtain your test results. Ask the lab or department performing the test when and how you will get your results. Document Released: 03/29/2004 Document Revised: 05/27/2011 Document Reviewed: 02/12/2008 [facility] Patient Information 2013 [place], [place].

## 2012-05-16 NOTE — Progress Notes (Signed)
  Subjective:    Patient ID: Isaac Hall, male    DOB: 1970-11-20, 42 y.o.   MRN: [id number]  HPI Doing well, a little overweight. Fhx of cancer and diabetes. No pain , does have  arash under arms and groin chronic and spreading   Review of Systems  Constitutional: Negative.   HENT: Negative.   Eyes: Negative.   Respiratory: Negative.   Cardiovascular: Negative.   Gastrointestinal: Negative.   Endocrine: Negative.   Genitourinary: Negative.   Skin: Positive for rash.  Allergic/Immunologic: Negative.   Neurological: Negative.   Hematological: Negative.   Psychiatric/Behavioral: Negative.        Objective:   Physical Exam  Vitals reviewed. Constitutional: He is oriented to person, place, and time. He appears well-developed and well-nourished.  HENT:  Right Ear: External ear normal.  Left Ear: External ear normal.  Nose: Nose normal.  Mouth/Throat: Oropharynx is clear and moist.  Eyes: Conjunctivae and EOM are normal. Pupils are equal, round, and reactive to light. No scleral icterus.  Neck: Neck supple. No tracheal deviation present. No thyromegaly present.  Cardiovascular: Normal rate, regular rhythm, normal heart sounds and intact distal pulses.   No murmur heard. Pulmonary/Chest: Effort normal and breath sounds normal.  Abdominal: Soft. Bowel sounds are normal. There is no tenderness.  Genitourinary: Rectum normal, prostate normal and penis normal.  Musculoskeletal: Normal range of motion.  Lymphadenopathy:    He has no cervical adenopathy.  Neurological: He is alert and oriented to person, place, and time. He has normal reflexes. No cranial nerve deficit. He exhibits normal muscle tone. Coordination normal.  Skin: Skin is warm. Rash noted. Rash is papular.  Axillary skin tags , many and growing by report, May be verrucal  Psychiatric: He has a normal mood and affect. Judgment and thought content normal.   Results for orders placed in visit on 05/16/12   GLUCOSE, POCT (MANUAL RESULT ENTRY)      Result Value Range   POC Glucose 96  70 - 99 mg/dl  POCT GLYCOSYLATED HEMOGLOBIN (HGB A1C)      Result Value Range   Hemoglobin A1C 6.1    POCT URINALYSIS DIPSTICK      Result Value Range   Color, UA yellow     Clarity, UA clear     Glucose, UA neg     Bilirubin, UA neg     Ketones, UA neg     Spec Grav, UA 1.020     Blood, UA small     pH, UA 7.0     Protein, UA 30     Urobilinogen, UA 0.2     Nitrite, UA neg     Leukocytes, UA Negative    IFOBT (OCCULT BLOOD)      Result Value Range   IFOBT Positive     EKG nl       Assessment & Plan:  Refer to dermatology Blood in stool refer to GI Lose 15 lbs

## 2012-05-19 ENCOUNTER — Encounter: Payer: Self-pay | Admitting: Radiology

## 2012-06-16 DIAGNOSIS — Z86010 Personal history of colonic polyps: Secondary | ICD-10-CM

## 2012-06-16 DIAGNOSIS — Z860101 Personal history of adenomatous and serrated colon polyps: Secondary | ICD-10-CM

## 2012-06-16 HISTORY — DX: Personal history of adenomatous and serrated colon polyps: Z86.0101

## 2012-06-16 HISTORY — DX: Personal history of colonic polyps: Z86.010

## 2012-06-16 HISTORY — PX: COLONOSCOPY W/ POLYPECTOMY: SHX1380

## 2012-06-17 ENCOUNTER — Other Ambulatory Visit: Payer: Self-pay | Admitting: Gastroenterology

## 2012-06-22 ENCOUNTER — Ambulatory Visit
Admission: RE | Admit: 2012-06-22 | Discharge: 2012-06-22 | Disposition: A | Discharge status: Home / Self Care | Payer: BC Managed Care – PPO | Source: Ambulatory Visit | Attending: Gastroenterology | Admitting: Gastroenterology

## 2013-02-16 ENCOUNTER — Ambulatory Visit (INDEPENDENT_AMBULATORY_CARE_PROVIDER_SITE_OTHER): Payer: BC Managed Care – PPO | Admitting: Radiology

## 2013-02-16 DIAGNOSIS — Z23 Encounter for immunization: Secondary | ICD-10-CM

## 2013-06-01 ENCOUNTER — Ambulatory Visit (INDEPENDENT_AMBULATORY_CARE_PROVIDER_SITE_OTHER): Payer: BC Managed Care – PPO | Admitting: Family Medicine

## 2013-06-01 VITALS — BP 110/70 | HR 86 | Temp 98.0°F | Resp 16 | Ht 65.5 in | Wt 186.0 lb

## 2013-06-01 DIAGNOSIS — R6889 Other general symptoms and signs: Secondary | ICD-10-CM

## 2013-06-01 DIAGNOSIS — H1030 Unspecified acute conjunctivitis, unspecified eye: Secondary | ICD-10-CM

## 2013-06-01 DIAGNOSIS — J069 Acute upper respiratory infection, unspecified: Secondary | ICD-10-CM

## 2013-06-01 LAB — POCT CBC
Granulocyte percent: 63.4 %G (ref 37–80)
HCT, POC: 47.9 % (ref 43.5–53.7)
HEMOGLOBIN: 15.9 g/dL (ref 14.1–18.1)
LYMPH, POC: 2.2 (ref 0.6–3.4)
MCH: 30 pg (ref 27–31.2)
MCHC: 33.2 g/dL (ref 31.8–35.4)
MCV: 90.3 fL (ref 80–97)
MID (CBC): 0.6 (ref 0–0.9)
MPV: 9.7 fL (ref 0–99.8)
POC GRANULOCYTE: 4.8 (ref 2–6.9)
POC LYMPH %: 28.4 % (ref 10–50)
POC MID %: 8.2 % (ref 0–12)
Platelet Count, POC: 253 10*3/uL (ref 142–424)
RBC: 5.3 M/uL (ref 4.69–6.13)
RDW, POC: 12.9 %
WBC: 7.6 10*3/uL (ref 4.6–10.2)

## 2013-06-01 LAB — POCT RAPID STREP A (OFFICE): Rapid Strep A Screen: NEGATIVE

## 2013-06-01 LAB — POCT INFLUENZA A/B
INFLUENZA A, POC: NEGATIVE
Influenza B, POC: NEGATIVE

## 2013-06-01 MED ORDER — IPRATROPIUM BROMIDE 0.03 % NA SOLN: 2.0000 | Freq: Two times a day (BID) | NASAL | Status: DC

## 2013-06-01 MED ORDER — HYDROCOD POLST-CHLORPHEN POLST 10-8 MG/5ML PO LQCR
5.0000 mL | Freq: Two times a day (BID) | ORAL | Status: DC | PRN
Start: 2013-06-01 — End: 2013-09-29

## 2013-06-01 NOTE — Patient Instructions (Signed)

## 2013-06-01 NOTE — Progress Notes (Addendum)
Subjective:  This chart was scribed for Nilda Simmer, MD by Carl Best, Medical Scribe. This patient was seen in Room 3 and the patient's care was started at 7:13 PM.   Patient ID: Isaac Hall, male    DOB: 03/29/1970, 43 y.o.   MRN: [id number]  HPI HPI Comments: Isaac Hall is a 43 y.o. male who presents to the [facility] complaining of constant flu-like symptoms that started three days ago.  He states that he had a low-grade fever of 99.5 degrees upon onset of his symptoms but has not experienced a fever since.  The patient lists chills, diaphoresis, myalgias, sore throat, mild rhinorrhea, nasal congestion, mild cough, drainage from his right eye, erythema in his right eye, and itching in his right eye as associated symptoms.  He denies blurred vision, eye pain, SOB, vomiting, diarrhea, rash as associated symptoms.  The patient states that he has been taking Ibuprofen for pain and 12 hour decongestant with mild relief to his symptoms.  He states that his son was sick last Friday with similar symptoms.  The patient denies having a history of chronic health problems.  He states that his PCP is Dr. Perrin Maltese.  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  He states that he did not work yesterday but worked today.     History reviewed. No pertinent past medical history. History reviewed. No pertinent past surgical history. History reviewed. No pertinent family history. History   Social History  . Marital Status: Married    Spouse Name: N/A    Number of Children: N/A  . Years of Education: N/A   Occupational History  . Not on file.   Social History Main Topics  . Smoking status: Never Smoker   . Smokeless tobacco: Not on file  . Alcohol Use: Not on file  . Drug Use: Not on file  . Sexual Activity: Not on file   Other Topics Concern  . Not on file   Social History Narrative  . No narrative on file   No Known Allergies  Review of Systems   Constitutional: Positive for chills and diaphoresis. Negative for fever.  HENT: Positive for congestion, rhinorrhea and sore throat.   Eyes: Positive for discharge (right eye), redness (right eye) and itching (right eye). Negative for pain and visual disturbance.  Respiratory: Positive for cough. Negative for shortness of breath.   Gastrointestinal: Negative for vomiting and diarrhea.  Musculoskeletal: Positive for myalgias.  Skin: Negative for rash.  All other systems reviewed and are negative.     Objective:  Physical Exam  Constitutional: He is oriented to person, place, and time. He appears well-developed and well-nourished. No distress.  HENT:  Head: Normocephalic and atraumatic.  Right Ear: External ear normal.  Left Ear: External ear normal.  Nose: Mucosal edema and rhinorrhea present.  Mouth/Throat: Posterior oropharyngeal erythema (diffuse) present. No oropharyngeal exudate.  Eyes: EOM are normal. Pupils are equal, round, and reactive to light. Right eye exhibits no discharge. Left eye exhibits no discharge. Right conjunctiva is injected. Right conjunctiva has no hemorrhage. Left conjunctiva is injected. Left conjunctiva has no hemorrhage.  Neck: Neck supple. No tracheal deviation present. No thyromegaly present.  Cardiovascular: Normal rate, regular rhythm and normal heart sounds.  Exam reveals no gallop and no friction rub.   No murmur heard. Pulmonary/Chest: Effort normal and breath sounds normal. No respiratory distress. He has no wheezes. He has no rales.  Musculoskeletal: Normal range of motion.  Lymphadenopathy:    He has no cervical adenopathy.  Neurological: He is alert and oriented to person, place, and time.  Skin: Skin is warm and dry. He is not diaphoretic.  Psychiatric: He has a normal mood and affect. His behavior is normal.  Nursing note and vitals reviewed.   Results for orders placed in visit on 06/01/13  POCT INFLUENZA A/B      Result Value Ref Range     Influenza A, POC Negative     Influenza B, POC Negative    POCT RAPID STREP A (OFFICE)      Result Value Ref Range   Rapid Strep A Screen Negative  Negative  POCT CBC      Result Value Ref Range   WBC 7.6  4.6 - 10.2 K/uL   Lymph, poc 2.2  0.6 - 3.4   POC LYMPH PERCENT 28.4  10 - 50 %L   MID (cbc) 0.6  0 - 0.9   POC MID % 8.2  0 - 12 %M   POC Granulocyte 4.8  2 - 6.9   Granulocyte percent 63.4  37 - 80 %G   RBC 5.30  4.69 - 6.13 M/uL   Hemoglobin 15.9  14.1 - 18.1 g/dL   HCT, POC 40.947.9  81.143.5 - 53.7 %   MCV 90.3  80 - 97 fL   MCH, POC 30.0  27 - 31.2 pg   MCHC 33.2  31.8 - 35.4 g/dL   RDW, POC 91.412.9     Platelet Count, POC 253  142 - 424 K/uL   MPV 9.7  0 - 99.8 fL    BP 110/70  Pulse 86  Temp(Src) 98 F (36.7 C) (Oral)  Resp 16  Ht 5' 5.5" (1.664 m)  Wt 186 lb (84.369 kg)  BMI 30.47 kg/m2  SpO2 98% Assessment & Plan:  Flu-like symptoms - Plan: POCT Influenza A/B, POCT rapid strep A, POCT CBC, Culture, Group A Strep  Conjunctivitis, acute  Acute upper respiratory infections of unspecified site   1. URI:  New. Consistent with viral syndrome; rx for Atrovent nasal spray and Tussionex provided.  Supportive care with rest, fluids, ibuprofen or tylenol. 2.  Conjunctivitis acute: New Consistent with viral syndrome; mild at this time.     Meds ordered this encounter  Medications  . ipratropium (ATROVENT) 0.03 % nasal spray    Sig: Place 2 sprays into the nose 2 (two) times daily.    Dispense:  30 mL    Refill:  0  . chlorpheniramine-HYDROcodone (TUSSIONEX PENNKINETIC ER) 10-8 MG/5ML LQCR    Sig: Take 5 mLs by mouth every 12 (twelve) hours as needed for cough.    Dispense:  180 mL    Refill:  0     I personally performed the services described in this documentation, which was scribed in my presence.  The recorded information has been reviewed and is accurate.  Nilda SimmerKristi , M.D.  [facility]  [place] [address] [place], [place]   [id number] [phone] phone [phone] fax

## 2013-06-04 LAB — CULTURE, GROUP A STREP: Organism ID, Bacteria: NORMAL

## 2013-09-29 ENCOUNTER — Ambulatory Visit (INDEPENDENT_AMBULATORY_CARE_PROVIDER_SITE_OTHER): Payer: BC Managed Care – PPO | Admitting: Physician Assistant

## 2013-09-29 VITALS — BP 128/84 | HR 78 | Temp 97.8°F | Resp 18 | Ht 64.0 in | Wt 188.0 lb

## 2013-09-29 DIAGNOSIS — R202 Paresthesia of skin: Secondary | ICD-10-CM

## 2013-09-29 DIAGNOSIS — R454 Irritability and anger: Secondary | ICD-10-CM

## 2013-09-29 DIAGNOSIS — R51 Headache: Secondary | ICD-10-CM

## 2013-09-29 DIAGNOSIS — R209 Unspecified disturbances of skin sensation: Secondary | ICD-10-CM

## 2013-09-29 MED ORDER — CYCLOBENZAPRINE HCL 10 MG PO TABS: 10.0000 mg | ORAL_TABLET | Freq: Two times a day (BID) | ORAL | Status: DC | PRN

## 2013-09-29 MED ORDER — CITALOPRAM HYDROBROMIDE 20 MG PO TABS: 20.0000 mg | ORAL_TABLET | Freq: Every day | ORAL | Status: DC

## 2013-09-29 MED ORDER — MELOXICAM 15 MG PO TABS: 15.0000 mg | ORAL_TABLET | Freq: Every day | ORAL | Status: DC

## 2013-09-29 NOTE — Patient Instructions (Signed)
STOP taking the ibuprofen. You CAN take acetaminophen with the meloxicam. The cyclobenzaprine (Flexeril) can cause you to feel sleepy-so reserve it for bedtime.

## 2013-09-29 NOTE — Progress Notes (Signed)
Subjective:    Patient ID: Isaac Hall, male    DOB: 11/14/1970, 43 y.o.   MRN: [id number]   PCP: Tally Due, MD  Chief Complaint  Patient presents with  . Headache    x 1 week  . Generalized Body Aches    x1  week   pt states burning sensation     Medications, allergies, past medical history, surgical history, family history, social history and problem list reviewed and updated.  HPI  1 week of headache.  Wraps around the back of the head.  Intermittent.  Ranges from 0-7/10.  No associated visual changes, dizziness, nausea/vomiting, weakness.  His wife was concerned that his BP yesterday was 160/109, but he thinks that the measurement was incorrect. He has tried ibuprofen, 1000 mg, several times but without relief.  In addition, he reports burning pain in both feet since 3 am today. No trauma or injury. No improvement with the ibuprofen he's tried for his HA.  He also notes that he's very impatient with his children and irritable.  His wife has had significant improvement with citalopram, and asks if he could try it, too. No thoughts of harming himself or others.  Review of Systems As above.    Objective:   Physical Exam  Vitals reviewed. Constitutional: He is oriented to person, place, and time. Vital signs are normal. He appears well-developed and well-nourished. He is active and cooperative. No distress.  BP 128/84  Pulse 78  Temp(Src) 97.8 F (36.6 C) (Oral)  Resp 18  Ht 5\' 4"  (1.626 m)  Wt 188 lb (85.276 kg)  BMI 32.25 kg/m2  SpO2 100%  HENT:  Head: Normocephalic and atraumatic.  Right Ear: Hearing, tympanic membrane, external ear and ear canal normal.  Left Ear: Hearing, tympanic membrane, external ear and ear canal normal.  Nose: Nose normal.  Mouth/Throat: Uvula is midline, oropharynx is clear and moist and mucous membranes are normal.  Eyes: Conjunctivae, EOM and lids are normal. Pupils are equal, round, and reactive to light. No scleral  icterus.  Fundoscopic exam:      The right eye shows no arteriolar narrowing, no AV nicking, no exudate, no hemorrhage and no papilledema. The right eye shows red reflex and venous pulsations.       The left eye shows no arteriolar narrowing, no AV nicking, no exudate, no hemorrhage and no papilledema. The left eye shows red reflex and venous pulsations.  Neck: Normal range of motion. Neck supple. No thyromegaly present.  Cardiovascular: Normal rate, regular rhythm and normal heart sounds.   Pulses:      Radial pulses are 2+ on the right side, and 2+ on the left side.  Pulmonary/Chest: Effort normal and breath sounds normal.  Musculoskeletal:       Right ankle: Normal. Achilles tendon normal.       Left ankle: Normal. Achilles tendon normal.       Right foot: Normal.       Left foot: Normal.  Lymphadenopathy:       Head (right side): No tonsillar, no preauricular, no posterior auricular and no occipital adenopathy present.       Head (left side): No tonsillar, no preauricular, no posterior auricular and no occipital adenopathy present.    He has no cervical adenopathy.       Right: No supraclavicular adenopathy present.       Left: No supraclavicular adenopathy present.  Neurological: He is alert and oriented to person, place, and time.  He has normal reflexes. No cranial nerve deficit or sensory deficit. He exhibits normal muscle tone. Coordination normal.  Skin: Skin is warm, dry and intact. No rash noted. No cyanosis or erythema. Nails show no clubbing.  Psychiatric: He has a normal mood and affect.          Assessment & Plan:  1. Headache(784.0) Likely tension HA, but cannot exclude intracranial abnormality. - meloxicam (MOBIC) 15 MG tablet; Take 1 tablet (15 mg total) by mouth daily.  Dispense: 30 tablet; Refill: 1 - cyclobenzaprine (FLEXERIL) 10 MG tablet; Take 1 tablet (10 mg total) by mouth 3 times/day as needed-between meals & bedtime for muscle spasms (headache).  Dispense:  30 tablet; Refill: 0 - CT Head Wo Contrast; Future  2. Paresthesias Await lab results. - CBC with Differential - TSH - Comprehensive metabolic panel - Vitamin B12  3. Irritability Trial of citalopram.  If ineffective, consider increased dose or change to Effexor XR. - citalopram (CELEXA) 20 MG tablet; Take 1 tablet (20 mg total) by mouth daily.  Dispense: 30 tablet; Refill: 3   Fernande Brashelle S. , PA-C Physician Assistant-Certified [facility]

## 2013-10-05 ENCOUNTER — Telehealth: Payer: Self-pay | Admitting: Family Medicine

## 2013-10-05 ENCOUNTER — Ambulatory Visit
Admission: RE | Admit: 2013-10-05 | Discharge: 2013-10-05 | Disposition: A | Discharge status: Home / Self Care | Payer: BC Managed Care – PPO | Source: Ambulatory Visit | Attending: Physician Assistant | Admitting: Physician Assistant

## 2013-10-05 DIAGNOSIS — R51 Headache: Secondary | ICD-10-CM

## 2013-10-05 DIAGNOSIS — R454 Irritability and anger: Secondary | ICD-10-CM

## 2013-10-05 DIAGNOSIS — R209 Unspecified disturbances of skin sensation: Secondary | ICD-10-CM

## 2013-10-05 NOTE — Telephone Encounter (Signed)
Called patient and spoke with Corrie DandyMary, his wife, to let him know he was supposed to have lab work done when he was here 7/15. He needs to come in for lab only visit. Corrie DandyMary stated he will come in today or tomorrow to have those done. Future orders placed.

## 2013-10-10 ENCOUNTER — Other Ambulatory Visit (INDEPENDENT_AMBULATORY_CARE_PROVIDER_SITE_OTHER): Payer: BC Managed Care – PPO | Admitting: Family Medicine

## 2013-10-10 DIAGNOSIS — R454 Irritability and anger: Secondary | ICD-10-CM

## 2013-10-10 DIAGNOSIS — R209 Unspecified disturbances of skin sensation: Secondary | ICD-10-CM

## 2013-10-10 DIAGNOSIS — R51 Headache: Secondary | ICD-10-CM

## 2013-10-10 LAB — CBC WITH DIFFERENTIAL/PLATELET
BASOS PCT: 0 % (ref 0–1)
Basophils Absolute: 0 10*3/uL (ref 0.0–0.1)
Eosinophils Absolute: 0.2 10*3/uL (ref 0.0–0.7)
Eosinophils Relative: 3 % (ref 0–5)
HCT: 46 % (ref 39.0–52.0)
Hemoglobin: 15.8 g/dL (ref 13.0–17.0)
Lymphocytes Relative: 34 % (ref 12–46)
Lymphs Abs: 2.1 10*3/uL (ref 0.7–4.0)
MCH: 29.4 pg (ref 26.0–34.0)
MCHC: 34.3 g/dL (ref 30.0–36.0)
MCV: 85.5 fL (ref 78.0–100.0)
MONO ABS: 0.4 10*3/uL (ref 0.1–1.0)
Monocytes Relative: 6 % (ref 3–12)
NEUTROS ABS: 3.5 10*3/uL (ref 1.7–7.7)
NEUTROS PCT: 57 % (ref 43–77)
Platelets: 197 10*3/uL (ref 150–400)
RBC: 5.38 MIL/uL (ref 4.22–5.81)
RDW: 13.5 % (ref 11.5–15.5)
WBC: 6.1 10*3/uL (ref 4.0–10.5)

## 2013-10-10 LAB — COMPREHENSIVE METABOLIC PANEL
ALT: 50 U/L (ref 0–53)
AST: 31 U/L (ref 0–37)
Albumin: 4.4 g/dL (ref 3.5–5.2)
Alkaline Phosphatase: 66 U/L (ref 39–117)
BUN: 17 mg/dL (ref 6–23)
CALCIUM: 9.3 mg/dL (ref 8.4–10.5)
CHLORIDE: 101 meq/L (ref 96–112)
CO2: 26 meq/L (ref 19–32)
Creat: 0.71 mg/dL (ref 0.50–1.35)
Glucose, Bld: 109 mg/dL — ABNORMAL HIGH (ref 70–99)
Potassium: 4.5 mEq/L (ref 3.5–5.3)
SODIUM: 136 meq/L (ref 135–145)
TOTAL PROTEIN: 7.4 g/dL (ref 6.0–8.3)
Total Bilirubin: 0.7 mg/dL (ref 0.2–1.2)

## 2013-10-10 LAB — VITAMIN B12: VITAMIN B 12: 776 pg/mL (ref 211–911)

## 2013-10-10 LAB — TSH: TSH: 1.368 u[IU]/mL (ref 0.350–4.500)

## 2014-06-09 ENCOUNTER — Other Ambulatory Visit: Payer: Self-pay | Admitting: Physician Assistant

## 2014-07-04 ENCOUNTER — Ambulatory Visit (INDEPENDENT_AMBULATORY_CARE_PROVIDER_SITE_OTHER): Payer: BLUE CROSS/BLUE SHIELD | Admitting: Physician Assistant

## 2014-07-04 VITALS — BP 110/80 | HR 85 | Temp 98.0°F | Resp 16 | Ht 65.0 in | Wt 192.0 lb

## 2014-07-04 DIAGNOSIS — E119 Type 2 diabetes mellitus without complications: Secondary | ICD-10-CM

## 2014-07-04 DIAGNOSIS — R631 Polydipsia: Secondary | ICD-10-CM

## 2014-07-04 DIAGNOSIS — R7301 Impaired fasting glucose: Secondary | ICD-10-CM | POA: Diagnosis not present

## 2014-07-04 DIAGNOSIS — G44209 Tension-type headache, unspecified, not intractable: Secondary | ICD-10-CM | POA: Diagnosis not present

## 2014-07-04 DIAGNOSIS — R110 Nausea: Secondary | ICD-10-CM | POA: Diagnosis not present

## 2014-07-04 DIAGNOSIS — E1165 Type 2 diabetes mellitus with hyperglycemia: Secondary | ICD-10-CM | POA: Insufficient documentation

## 2014-07-04 DIAGNOSIS — R454 Irritability and anger: Secondary | ICD-10-CM | POA: Diagnosis not present

## 2014-07-04 LAB — POCT UA - MICROSCOPIC ONLY
Casts, Ur, LPF, POC: NEGATIVE
Crystals, Ur, HPF, POC: NEGATIVE
EPITHELIAL CELLS, URINE PER MICROSCOPY: NEGATIVE
MUCUS UA: NEGATIVE
Yeast, UA: NEGATIVE

## 2014-07-04 LAB — POCT CBC
Granulocyte percent: 60.6 %G (ref 37–80)
HEMATOCRIT: 46.1 % (ref 43.5–53.7)
HEMOGLOBIN: 15.6 g/dL (ref 14.1–18.1)
Lymph, poc: 2.1 (ref 0.6–3.4)
MCH, POC: 29.4 pg (ref 27–31.2)
MCHC: 33.8 g/dL (ref 31.8–35.4)
MCV: 87 fL (ref 80–97)
MID (CBC): 0.4 (ref 0–0.9)
MPV: 7.9 fL (ref 0–99.8)
POC Granulocyte: 3.9 (ref 2–6.9)
POC LYMPH PERCENT: 32.5 %L (ref 10–50)
POC MID %: 6.9 % (ref 0–12)
Platelet Count, POC: 185 10*3/uL (ref 142–424)
RBC: 5.3 M/uL (ref 4.69–6.13)
RDW, POC: 13.5 %
WBC: 6.4 10*3/uL (ref 4.6–10.2)

## 2014-07-04 LAB — POCT URINALYSIS DIPSTICK
BILIRUBIN UA: NEGATIVE
Glucose, UA: NEGATIVE
KETONES UA: NEGATIVE
Leukocytes, UA: NEGATIVE
Nitrite, UA: NEGATIVE
PH UA: 6.5
Protein, UA: NEGATIVE
Spec Grav, UA: 1.015
Urobilinogen, UA: 0.2

## 2014-07-04 LAB — COMPLETE METABOLIC PANEL WITH GFR
ALT: 64 U/L — ABNORMAL HIGH (ref 0–53)
AST: 34 U/L (ref 0–37)
Albumin: 4.4 g/dL (ref 3.5–5.2)
Alkaline Phosphatase: 69 U/L (ref 39–117)
BILIRUBIN TOTAL: 0.5 mg/dL (ref 0.2–1.2)
BUN: 8 mg/dL (ref 6–23)
CHLORIDE: 103 meq/L (ref 96–112)
CO2: 26 mEq/L (ref 19–32)
Calcium: 9.1 mg/dL (ref 8.4–10.5)
Creat: 0.64 mg/dL (ref 0.50–1.35)
GLUCOSE: 108 mg/dL — AB (ref 70–99)
Potassium: 4.1 mEq/L (ref 3.5–5.3)
Sodium: 136 mEq/L (ref 135–145)
Total Protein: 7.1 g/dL (ref 6.0–8.3)

## 2014-07-04 LAB — POCT GLYCOSYLATED HEMOGLOBIN (HGB A1C): HEMOGLOBIN A1C: 6.7

## 2014-07-04 LAB — GLUCOSE, POCT (MANUAL RESULT ENTRY): POC Glucose: 107 mg/dl — AB (ref 70–99)

## 2014-07-04 MED ORDER — CITALOPRAM HYDROBROMIDE 20 MG PO TABS: 20.0000 mg | ORAL_TABLET | Freq: Every day | ORAL | Status: DC

## 2014-07-04 NOTE — Progress Notes (Signed)
Subjective:    Patient ID: Isaac Hall, male    DOB: Aug 25, 1970, 44 y.o.   MRN: [id number]  Chief Complaint  Patient presents with  . Hypertension  . Headache    x 3 days  . Nausea    x 3 days  . Dizziness    x 3 days   Prior to Admission medications   Medication Sig Start Date End Date Taking? Authorizing Provider  citalopram (CELEXA) 20 MG tablet Take 1 tablet (20 mg total) by mouth daily. PATIENT NEEDS OFFICE VISIT FOR ADDITIONAL REFILLS 06/10/14  Yes Isaac RiddleSarah L Weber, PA-C  ipratropium (ATROVENT) 0.03 % nasal spray Place 2 sprays into the nose 2 (two) times daily. 06/01/13  Yes Isaac ChickKristi M Smith, MD  Medications, allergies, past medical history, surgical history, family history, social history and problem list reviewed and updated.  HPI  [age] yom with recent mildly elevated fasting bg presents with concerns for htn, ha, nausea, dizziness.   HA - Mild bilateral HA over past few days. Has been fairly persistent past few days. Sometimes just left sided, sometimes bi frontal. Approx 8/10 past few days. Was gradual onset. Not worst of life. No fevers, chills. No assoc vision changes, numbness, weakness. No photo/phonophobia. Mild dizziness past few days with position change. Denies vertigo.   High bp - checked bp at sisters house 3 days ago. 160/110. Checked it twice at pharmacy yest and was 150/90, 148/98. Denies cp, sob, palps, syncope.   Nausea - Mild past few days. Eating/drinking ok. No abd pain. No vomiting, diarrhea. No fevers, chills. Normal bm this am.   7/15 - seen here with HA. Had normal head ct at that time. Tsh/cbc/b12/cmp were all normal except mildly elevated fasting bg. He mentioned bilateral feet burning at that time. Was also started on celexa for irritability.   Today he states he didn't start the celexa qd until approx 5 wks ago. Still has his original 90 day supply. He mentions both of his feet feel hot throughout the day but denies shooting pains, tingling,  prickling, numbness, weakness. Recent polydipsia but no polyuria.   Strong DM2 family hx.   Review of Systems See HPI.     Objective:   Physical Exam  Constitutional: He is oriented to person, place, and time. He appears well-developed and well-nourished.  Non-toxic appearance. He does not have a sickly appearance. He does not appear ill. No distress.  BP 110/80 mmHg  Pulse 85  Temp(Src) 98 F (36.7 C) (Oral)  Resp 16  Ht 5\' 5"  (1.651 m)  Wt 192 lb (87.091 kg)  BMI 31.95 kg/m2  SpO2 99%   Eyes: Conjunctivae and EOM are normal. Pupils are equal, round, and reactive to light.  Neck: No Brudzinski's sign noted.  Cardiovascular: Normal rate, regular rhythm and normal heart sounds.   Abdominal: Soft. Normal appearance and bowel sounds are normal. There is no tenderness. There is no rigidity, no rebound and no guarding.  Neurological: He is alert and oriented to person, place, and time. He has normal strength. No cranial nerve deficit or sensory deficit. He displays a negative Romberg sign.  6/6 monofilament right foot. 5/6 left foot. Normal position sense big toes. Normal strength, sensation feet bilaterally.   Psychiatric: He has a normal mood and affect. His speech is normal and behavior is normal.   Results for orders placed or performed in visit on 07/04/14  POCT urinalysis dipstick  Result Value Ref Range   Color, UA yellow  Clarity, UA clear    Glucose, UA neg    Bilirubin, UA neg    Ketones, UA neg    Spec Grav, UA 1.015    Blood, UA small    pH, UA 6.5    Protein, UA neg    Urobilinogen, UA 0.2    Nitrite, UA neg    Leukocytes, UA Negative   POCT UA - Microscopic Only  Result Value Ref Range   WBC, Ur, HPF, POC 0-1    RBC, urine, microscopic 0-1    Bacteria, U Microscopic small    Mucus, UA neg    Epithelial cells, urine per micros neg    Crystals, Ur, HPF, POC neg    Casts, Ur, LPF, POC neg    Yeast, UA neg   POCT glycosylated hemoglobin (Hb A1C)  Result  Value Ref Range   Hemoglobin A1C 6.7   POCT glucose (manual entry)  Result Value Ref Range   POC Glucose 107 (A) 70 - 99 mg/dl  POCT CBC  Result Value Ref Range   WBC 6.4 4.6 - 10.2 K/uL   Lymph, poc 2.1 0.6 - 3.4   POC LYMPH PERCENT 32.5 10 - 50 %L   MID (cbc) 0.4 0 - 0.9   POC MID % 6.9 0 - 12 %M   POC Granulocyte 3.9 2 - 6.9   Granulocyte percent 60.6 37 - 80 %G   RBC 5.30 4.69 - 6.13 M/uL   Hemoglobin 15.6 14.1 - 18.1 g/dL   HCT, POC 16.1 09.6 - 53.7 %   MCV 87.0 80 - 97 fL   MCH, POC 29.4 27 - 31.2 pg   MCHC 33.8 31.8 - 35.4 g/dL   RDW, POC 04.5 %   Platelet Count, POC 185 142 - 424 K/uL   MPV 7.9 0 - 99.8 fL      Assessment & Plan:   69 yom with recent mildly elevated fasting bg presents with concerns for htn, ha, nausea, dizziness.   Elevated fasting glucose - Plan: POCT urinalysis dipstick, POCT UA - Microscopic Only, POCT glycosylated hemoglobin (Hb A1C), POCT glucose (manual entry), COMPLETE METABOLIC PANEL WITH GFR Polydipsia - Plan: POCT urinalysis dipstick, POCT UA - Microscopic Only, POCT glucose (manual entry) --A1C 6.7 --> newly diagnosed DM2 --pt opts for attempting control with diet/exercise today, instructions given --rtc 3 months for A1C --> instructed will likely start metformin if persists high --lipids at that time, discuss option of asa at that time, uma at that time pending a1c --normal monofilament testing today --bp well controlled today, instructed to monitor with home cuff and bring readings to appt in 3 months  Tension-type headache, not intractable, unspecified chronicity pattern --normal vitals, normal neuro exam, denies worst of life, not sudden onset, no nuchal rigidity, no assoc photo/phonophobia --normal head ct 7/15 --likely tension type  Nausea without vomiting - Plan: POCT urinalysis dipstick, POCT UA - Microscopic Only, POCT CBC --benign abd exam, no assoc vomiting, diarrhea, abd pain --no leukocytosis --no uti, small amnt blood in  urine, pt instructed to rtc 6 months for repeat ua as they state this is known from a previous ua  --could be mild viral gastroenteritis or possibly side effect of celexa which he recently started taking daily --rtc if persists or worsens  Irritability - Plan: citalopram (CELEXA) 20 MG tablet --celexa refilled for 6 months as pt just started taking it qd --rtc 6 months for evaluation  Donnajean Lopes, PA-C Physician Assistant-Certified [facility]  07/04/2014 12:05 PM

## 2014-07-04 NOTE — Patient Instructions (Addendum)
We refilled your celexa today for 6 months since you just started taking it. Starting celexa can cause adverse effects such as nausea and mild hypotension which can cause dizziness. Hopefully these resolve after being on the medication for awhile.  Please pick up a blood pressure cuff and start taking your blood pressure at home 3-4 times per week. If you are running above 140/90 please let us know and we will likely add a blood pressure medication.  For the headache, please take tylenol 500 mg every 4-6 hours as needed. Do not take more than 3000 mg per day. We drew labs to check your liver, kidneys, and electrolytes. I'll be in touch with you with these results.  We checked your sugar, A1C, urine sample, and cbc today. Your urine sample did not show a UTI but you did have small amount of blood in the urine. This seems to be stable. We will recheck this in 6 months to monitor. Your A1C was 6.7 which is in the diabetic range. As we discussed, let's work on diet and exercise to start with. Plan to come back in 3 months for a recheck. If your A1C remains elevated we will likely start medication at that time. We will also likely draw cholesterol to check as well.  Please pick up a glucose monitor to check for the next few months 1-2 times per week. Write these numbers down and bring them with you at next visit.   Diabetes Mellitus and Food It is important for you to manage your blood sugar (glucose) level. Your blood glucose level can be greatly affected by what you eat. Eating healthier foods in the appropriate amounts throughout the day at about the same time each day will help you control your blood glucose level. It can also help slow or prevent worsening of your diabetes mellitus. Healthy eating may even help you improve the level of your blood pressure and reach or maintain a healthy weight.  HOW CAN FOOD AFFECT ME? Carbohydrates Carbohydrates affect your blood glucose level more than any other type  of food. Your dietitian will help you determine how many carbohydrates to eat at each meal and teach you how to count carbohydrates. Counting carbohydrates is important to keep your blood glucose at a healthy level, especially if you are using insulin or taking certain medicines for diabetes mellitus. Alcohol Alcohol can cause sudden decreases in blood glucose (hypoglycemia), especially if you use insulin or take certain medicines for diabetes mellitus. Hypoglycemia can be a life-threatening condition. Symptoms of hypoglycemia (sleepiness, dizziness, and disorientation) are similar to symptoms of having too much alcohol.  If your health care provider has given you approval to drink alcohol, do so in moderation and use the following guidelines:  Women should not have more than one drink per day, and men should not have more than two drinks per day. One drink is equal to:  12 oz of beer.  5 oz of wine.  1 oz of hard liquor.  Do not drink on an empty stomach.  Keep yourself hydrated. Have water, diet soda, or unsweetened iced tea.  Regular soda, juice, and other mixers might contain a lot of carbohydrates and should be counted. WHAT FOODS ARE NOT RECOMMENDED? As you make food choices, it is important to remember that all foods are not the same. Some foods have fewer nutrients per serving than other foods, even though they might have the same number of calories or carbohydrates. It is difficult to get  your body what it needs when you eat foods with fewer nutrients. Examples of foods that you should avoid that are high in calories and carbohydrates but low in nutrients include:  Trans fats (most processed foods list trans fats on the Nutrition Facts label).  Regular soda.  Juice.  Candy.  Sweets, such as cake, pie, doughnuts, and cookies.  Fried foods. WHAT FOODS CAN I EAT? Have nutrient-rich foods, which will nourish your body and keep you healthy. The food you should eat also will  depend on several factors, including:  The calories you need.  The medicines you take.  Your weight.  Your blood glucose level.  Your blood pressure level.  Your cholesterol level. You also should eat a variety of foods, including:  Protein, such as meat, poultry, fish, tofu, nuts, and seeds (lean animal proteins are best).  Fruits.  Vegetables.  Dairy products, such as milk, cheese, and yogurt (low fat is best).  Breads, grains, pasta, cereal, rice, and beans.  Fats such as olive oil, trans fat-free margarine, canola oil, avocado, and olives. DOES EVERYONE WITH DIABETES MELLITUS HAVE THE SAME MEAL PLAN? Because every person with diabetes mellitus is different, there is not one meal plan that works for everyone. It is very important that you meet with a dietitian who will help you create a meal plan that is just right for you. Document Released: 11/29/2004 Document Revised: 03/09/2013 Document Reviewed: 01/29/2013 [facility] Patient Information 2015 [place], [place]. This information is not intended to replace advice given to you by your health care provider. Make sure you discuss any questions you have with your health care provider.

## 2014-10-12 ENCOUNTER — Other Ambulatory Visit: Payer: Self-pay | Admitting: Physician Assistant

## 2014-11-21 ENCOUNTER — Ambulatory Visit (INDEPENDENT_AMBULATORY_CARE_PROVIDER_SITE_OTHER): Payer: BLUE CROSS/BLUE SHIELD | Admitting: Family Medicine

## 2014-11-21 VITALS — BP 112/68 | HR 90 | Temp 97.8°F | Resp 18 | Ht 64.5 in | Wt 192.0 lb

## 2014-11-21 DIAGNOSIS — Z23 Encounter for immunization: Secondary | ICD-10-CM

## 2014-11-21 DIAGNOSIS — S39012A Strain of muscle, fascia and tendon of lower back, initial encounter: Secondary | ICD-10-CM

## 2014-11-21 LAB — POCT URINALYSIS DIPSTICK
Bilirubin, UA: NEGATIVE
Glucose, UA: 500
Ketones, UA: NEGATIVE
Leukocytes, UA: NEGATIVE
Nitrite, UA: NEGATIVE
Protein, UA: NEGATIVE
Spec Grav, UA: 1.015
Urobilinogen, UA: 0.2
pH, UA: 6.5

## 2014-11-21 LAB — POCT UA - MICROSCOPIC ONLY
Casts, Ur, LPF, POC: NEGATIVE
Crystals, Ur, HPF, POC: NEGATIVE
Mucus, UA: NEGATIVE
Yeast, UA: NEGATIVE

## 2014-11-21 MED ORDER — CYCLOBENZAPRINE HCL 5 MG PO TABS: 5.0000 mg | ORAL_TABLET | Freq: Three times a day (TID) | ORAL | Status: DC | PRN

## 2014-11-21 MED ORDER — DICLOFENAC SODIUM 75 MG PO TBEC: 75.0000 mg | DELAYED_RELEASE_TABLET | Freq: Two times a day (BID) | ORAL | Status: DC

## 2014-11-21 NOTE — Progress Notes (Signed)
Patient ID: Isaac Hall, male   DOB: [birth year], 44 y.o.   MRN: [id number]  This chart was scribed for Isaac Sidle, MD by Charline Bills, ED Scribe. The patient was seen in room 13. Patient's care was started at 12:36 PM.  Patient ID: Isaac Hall MRN: [id number], DOB: 1970/04/19, 44 y.o. Date of Encounter: 11/21/2014, 12:35 PM  Primary Physician: Tally Due, MD   Chief Complaint  Patient presents with  . Back Pain    since Friday no relief with ibuprofen    HPI: 44 y.o. year old male with history below presents with gradual onset of worsening left lower back pain onset 3 days ago. Pt reports constant left back pain that radiates into right buttock and is exacerbated with rotation. 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 He has tired ibuprofen without significant relief. He denies fever, hematuria, dysuria, urinary frequency, abdominal pain.  No past medical history on file.    Home Meds: Prior to Admission medications   Medication Sig Start Date End Date Taking? Authorizing Provider  citalopram (CELEXA) 20 MG tablet TAKE 1 TABLET BY MOUTH ONCE DAILY.  "OV NEEDED" 2ND 10/12/14  Yes Chelle Jeffery, PA-C  cyclobenzaprine (FLEXERIL) 10 MG tablet Take 10 mg by mouth 3 (three) times daily as needed for muscle spasms.   Yes Historical Provider, MD  ipratropium (ATROVENT) 0.03 % nasal spray Place 2 sprays into the nose 2 (two) times daily. Patient not taking: Reported on 11/21/2014 06/01/13   Ethelda Chick, MD    Allergies: No Known Allergies  Social History   Social History  . Marital Status: Married    Spouse Name: Corrie Dandy  . Number of Children: 2  . Years of Education: 12th grade   Occupational History  . MACHINE OPERATOR    Social History Main Topics  . Smoking status: Never Smoker   . Smokeless tobacco: Never Used  . Alcohol Use: No  . Drug Use: No  . Sexual Activity: Not on file   Other Topics Concern  . Not on  file   Social History Narrative   Lives with his wife and their 2 children.   Originally from [place].  Came to the [place] in 1995.     Review of Systems: Constitutional: negative for fever, chills, night sweats, weight changes, or fatigue  HEENT: negative for vision changes, hearing loss, congestion, rhinorrhea, ST, epistaxis, or sinus pressure Cardiovascular: negative for chest pain or palpitations Respiratory: negative for hemoptysis, wheezing, shortness of breath, or cough Abdominal: negative for abdominal pain, nausea, vomiting, diarrhea, or constipation Msk: + back pain GU: negative for urinary frequency, dysuria, hematuria   Dermatological: negative for rash Neurologic: negative for headache, dizziness, or syncope All other systems reviewed and are otherwise negative with the exception to those above and in the HPI.  Physical Exam: Blood pressure 112/68, pulse 90, temperature 97.8 F (36.6 C), resp. rate 18, height 5' 4.5" (1.638 m), weight 192 lb (87.091 kg), SpO2 98 %., Body mass index is 32.46 kg/(m^2). General: Well developed, well nourished, in no acute distress. Head: Normocephalic, atraumatic, eyes without discharge, sclera non-icteric, nares are without discharge. Bilateral auditory canals clear, TM's are without perforation, pearly grey and translucent with reflective cone of light bilaterally. Oral cavity moist, posterior pharynx without exudate, erythema, peritonsillar abscess, or post nasal drip.  Neck: Supple. No thyromegaly. Full ROM. No lymphadenopathy. Lungs: Clear bilaterally to auscultation without wheezes, rales, or rhonchi. Breathing is unlabored. Heart: RRR  with S1 S2. No murmurs, rubs, or gallops appreciated. Abdomen: Soft, non-tender, non-distended with normoactive bowel sounds. No hepatomegaly. No rebound/guarding. No obvious abdominal masses. Msk:  Strength and tone normal for age. Tender in L paraspinal lumbar area. Normal straight leg raising.    Extremities/Skin: Warm and dry. No clubbing or cyanosis. No edema. No rashes or suspicious lesions. Neuro: Alert and oriented X 3. Moves all extremities spontaneously. Gait is normal. CNII-XII grossly in tact. Psych:  Responds to questions appropriately with a normal affect.   Labs: Results for orders placed or performed in visit on 11/21/14  POCT urinalysis dipstick  Result Value Ref Range   Color, UA yellow    Clarity, UA clear    Glucose, UA 500    Bilirubin, UA neg    Ketones, UA neg    Spec Grav, UA 1.015    Blood, UA moderate    pH, UA 6.5    Protein, UA neg    Urobilinogen, UA 0.2    Nitrite, UA neg    Leukocytes, UA Negative Negative  POCT UA - Microscopic Only  Result Value Ref Range   WBC, Ur, HPF, POC 0-1    RBC, urine, microscopic 2-4    Bacteria, U Microscopic trace    Mucus, UA neg    Epithelial cells, urine per micros 0-2    Crystals, Ur, HPF, POC neg    Casts, Ur, LPF, POC neg    Yeast, UA neg       ASSESSMENT AND PLAN:  44 y.o. year old male with  1. Lumbar strain, initial encounter    This chart was scribed in my presence and reviewed by me personally.    ICD-9-CM ICD-10-CM   1. Lumbar strain, initial encounter 847.2 S39.012A POCT urinalysis dipstick     POCT UA - Microscopic Only     Signed, Isaac Sidle, MD   Signed, Isaac Sidle, MD 11/21/2014 12:35 PM

## 2014-11-21 NOTE — Patient Instructions (Signed)
Please return in 2 days so we can determine whether further medication or investigation is in order. In the meantime stay out of work.

## 2014-11-24 ENCOUNTER — Ambulatory Visit (INDEPENDENT_AMBULATORY_CARE_PROVIDER_SITE_OTHER): Payer: BLUE CROSS/BLUE SHIELD | Admitting: Physician Assistant

## 2014-11-24 VITALS — BP 118/80 | HR 84 | Temp 98.2°F | Resp 16 | Ht 64.0 in | Wt 192.0 lb

## 2014-11-24 DIAGNOSIS — M545 Low back pain: Secondary | ICD-10-CM | POA: Diagnosis not present

## 2014-11-24 MED ORDER — PREDNISONE 20 MG PO TABS: ORAL_TABLET | ORAL | Status: AC

## 2014-11-24 NOTE — Patient Instructions (Addendum)
Please ice the back 3-4 times per day for 15 minutes.  When you are about to do the stretches, please use heat prior to stretches.  Then immediately return to ice.   Low Back Strain with Rehab A strain is an injury in which a tendon or muscle is torn. The muscles and tendons of the lower back are vulnerable to strains. However, these muscles and tendons are very strong and require a great force to be injured. Strains are classified into three categories. Grade 1 strains cause pain, but the tendon is not lengthened. Grade 2 strains include a lengthened ligament, due to the ligament being stretched or partially ruptured. With grade 2 strains there is still function, although the function may be decreased. Grade 3 strains involve a complete tear of the tendon or muscle, and function is usually impaired. SYMPTOMS   Pain in the lower back.  Pain that affects one side more than the other.  Pain that gets worse with movement and may be felt in the hip, buttocks, or back of the thigh.  Muscle spasms of the muscles in the back.  Swelling along the muscles of the back.  Loss of strength of the back muscles.  Crackling sound (crepitation) when the muscles are touched. CAUSES  Lower back strains occur when a force is placed on the muscles or tendons that is greater than they can handle. Common causes of injury include:  Prolonged overuse of the muscle-tendon units in the lower back, usually from incorrect posture.  A single violent injury or force applied to the back. RISK INCREASES WITH:  Sports that involve twisting forces on the spine or a lot of bending at the waist (football, rugby, weightlifting, bowling, golf, tennis, speed skating, racquetball, swimming, running, gymnastics, diving).  Poor strength and flexibility.  Failure to warm up properly before activity.  Family history of lower back pain or disk disorders.  Previous back injury or surgery (especially fusion).  Poor posture  with lifting, especially heavy objects.  Prolonged sitting, especially with poor posture. PREVENTION   Learn and use proper posture when sitting or lifting (maintain proper posture when sitting, lift using the knees and legs, not at the waist).  Warm up and stretch properly before activity.  Allow for adequate recovery between workouts.  Maintain physical fitness:  Strength, flexibility, and endurance.  Cardiovascular fitness. PROGNOSIS  If treated properly, lower back strains usually heal within 6 weeks. RELATED COMPLICATIONS   Recurring symptoms, resulting in a chronic problem.  Chronic inflammation, scarring, and partial muscle-tendon tear.  Delayed healing or resolution of symptoms.  Prolonged disability. TREATMENT  Treatment first involves the use of ice and medicine, to reduce pain and inflammation. The use of strengthening and stretching exercises may help reduce pain with activity. These exercises may be performed at home or with a therapist. Severe injuries may require referral to a therapist for further evaluation and treatment, such as ultrasound. Your caregiver may advise that you wear a back brace or corset, to help reduce pain and discomfort. Often, prolonged bed rest results in greater harm then benefit. Corticosteroid injections may be recommended. However, these should be reserved for the most serious cases. It is important to avoid using your back when lifting objects. At night, sleep on your back on a firm mattress with a pillow placed under your knees. If non-surgical treatment is unsuccessful, surgery may be needed.  MEDICATION   If pain medicine is needed, nonsteroidal anti-inflammatory medicines (aspirin and ibuprofen), or other minor  pain relievers (acetaminophen), are often advised.  Do not take pain medicine for 7 days before surgery.  Prescription pain relievers may be given, if your caregiver thinks they are needed. Use only as directed and only as much  as you need.  Ointments applied to the skin may be helpful.  Corticosteroid injections may be given by your caregiver. These injections should be reserved for the most serious cases, because they may only be given a certain number of times. HEAT AND COLD  Cold treatment (icing) should be applied for 10 to 15 minutes every 2 to 3 hours for inflammation and pain, and immediately after activity that aggravates your symptoms. Use ice packs or an ice massage.  Heat treatment may be used before performing stretching and strengthening activities prescribed by your caregiver, physical therapist, or athletic trainer. Use a heat pack or a warm water soak. SEEK MEDICAL CARE IF:   Symptoms get worse or do not improve in 2 to 4 weeks, despite treatment.  You develop numbness, weakness, or loss of bowel or bladder function.  New, unexplained symptoms develop. (Drugs used in treatment may produce side effects.) EXERCISES  RANGE OF MOTION (ROM) AND STRETCHING EXERCISES - Low Back Strain Most people with lower back pain will find that their symptoms get worse with excessive bending forward (flexion) or arching at the lower back (extension). The exercises which will help resolve your symptoms will focus on the opposite motion.  Your physician, physical therapist or athletic trainer will help you determine which exercises will be most helpful to resolve your lower back pain. Do not complete any exercises without first consulting with your caregiver. Discontinue any exercises which make your symptoms worse until you speak to your caregiver.  If you have pain, numbness or tingling which travels down into your buttocks, leg or foot, the goal of the therapy is for these symptoms to move closer to your back and eventually resolve. Sometimes, these leg symptoms will get better, but your lower back pain may worsen. This is typically an indication of progress in your rehabilitation. Be very alert to any changes in your  symptoms and the activities in which you participated in the 24 hours prior to the change. Sharing this information with your caregiver will allow him/her to most efficiently treat your condition.  These exercises may help you when beginning to rehabilitate your injury. Your symptoms may resolve with or without further involvement from your physician, physical therapist or athletic trainer. While completing these exercises, remember:  Restoring tissue flexibility helps normal motion to return to the joints. This allows healthier, less painful movement and activity.  An effective stretch should be held for at least 30 seconds.  A stretch should never be painful. You should only feel a gentle lengthening or release in the stretched tissue. FLEXION RANGE OF MOTION AND STRETCHING EXERCISES: STRETCH - Flexion, Single Knee to Chest   Lie on a firm bed or floor with both legs extended in front of you.  Keeping one leg in contact with the floor, bring your opposite knee to your chest. Hold your leg in place by either grabbing behind your thigh or at your knee.  Pull until you feel a gentle stretch in your lower back. Hold __________ seconds.  Slowly release your grasp and repeat the exercise with the opposite side. Repeat __________ times. Complete this exercise __________ times per day.  STRETCH - Flexion, Double Knee to Chest   Lie on a firm bed or floor with  both legs extended in front of you.  Keeping one leg in contact with the floor, bring your opposite knee to your chest.  Tense your stomach muscles to support your back and then lift your other knee to your chest. Hold your legs in place by either grabbing behind your thighs or at your knees.  Pull both knees toward your chest until you feel a gentle stretch in your lower back. Hold __________ seconds.  Tense your stomach muscles and slowly return one leg at a time to the floor. Repeat __________ times. Complete this exercise __________  times per day.  STRETCH - Low Trunk Rotation  Lie on a firm bed or floor. Keeping your legs in front of you, bend your knees so they are both pointed toward the ceiling and your feet are flat on the floor.  Extend your arms out to the side. This will stabilize your upper body by keeping your shoulders in contact with the floor.  Gently and slowly drop both knees together to one side until you feel a gentle stretch in your lower back. Hold for __________ seconds.  Tense your stomach muscles to support your lower back as you bring your knees back to the starting position. Repeat the exercise to the other side. Repeat __________ times. Complete this exercise __________ times per day  EXTENSION RANGE OF MOTION AND FLEXIBILITY EXERCISES: STRETCH - Extension, Prone on Elbows   Lie on your stomach on the floor, a bed will be too soft. Place your palms about shoulder width apart and at the height of your head.  Place your elbows under your shoulders. If this is too painful, stack pillows under your chest.  Allow your body to relax so that your hips drop lower and make contact more completely with the floor.  Hold this position for __________ seconds.  Slowly return to lying flat on the floor. Repeat __________ times. Complete this exercise __________ times per day.  RANGE OF MOTION - Extension, Prone Press Ups  Lie on your stomach on the floor, a bed will be too soft. Place your palms about shoulder width apart and at the height of your head.  Keeping your back as relaxed as possible, slowly straighten your elbows while keeping your hips on the floor. You may adjust the placement of your hands to maximize your comfort. As you gain motion, your hands will come more underneath your shoulders.  Hold this position __________ seconds.  Slowly return to lying flat on the floor. Repeat __________ times. Complete this exercise __________ times per day.  RANGE OF MOTION- Quadruped, Neutral Spine    Assume a hands and knees position on a firm surface. Keep your hands under your shoulders and your knees under your hips. You may place padding under your knees for comfort.  Drop your head and point your tail bone toward the ground below you. This will round out your lower back like an angry cat. Hold this position for __________ seconds.  Slowly lift your head and release your tail bone so that your back sags into a large arch, like an old horse.  Hold this position for __________ seconds.  Repeat this until you feel limber in your lower back.  Now, find your "sweet spot." This will be the most comfortable position somewhere between the two previous positions. This is your neutral spine. Once you have found this position, tense your stomach muscles to support your lower back.  Hold this position for __________ seconds. Repeat __________  times. Complete this exercise __________ times per day.  STRENGTHENING EXERCISES - Low Back Strain These exercises may help you when beginning to rehabilitate your injury. These exercises should be done near your "sweet spot." This is the neutral, low-back arch, somewhere between fully rounded and fully arched, that is your least painful position. When performed in this safe range of motion, these exercises can be used for people who have either a flexion or extension based injury. These exercises may resolve your symptoms with or without further involvement from your physician, physical therapist or athletic trainer. While completing these exercises, remember:   Muscles can gain both the endurance and the strength needed for everyday activities through controlled exercises.  Complete these exercises as instructed by your physician, physical therapist or athletic trainer. Increase the resistance and repetitions only as guided.  You may experience muscle soreness or fatigue, but the pain or discomfort you are trying to eliminate should never worsen during  these exercises. If this pain does worsen, stop and make certain you are following the directions exactly. If the pain is still present after adjustments, discontinue the exercise until you can discuss the trouble with your caregiver. STRENGTHENING - Deep Abdominals, Pelvic Tilt  Lie on a firm bed or floor. Keeping your legs in front of you, bend your knees so they are both pointed toward the ceiling and your feet are flat on the floor.  Tense your lower abdominal muscles to press your lower back into the floor. This motion will rotate your pelvis so that your tail bone is scooping upwards rather than pointing at your feet or into the floor.  With a gentle tension and even breathing, hold this position for __________ seconds. Repeat __________ times. Complete this exercise __________ times per day.  STRENGTHENING - Abdominals, Crunches   Lie on a firm bed or floor. Keeping your legs in front of you, bend your knees so they are both pointed toward the ceiling and your feet are flat on the floor. Cross your arms over your chest.  Slightly tip your chin down without bending your neck.  Tense your abdominals and slowly lift your trunk high enough to just clear your shoulder blades. Lifting higher can put excessive stress on the lower back and does not further strengthen your abdominal muscles.  Control your return to the starting position. Repeat __________ times. Complete this exercise __________ times per day.  STRENGTHENING - Quadruped, Opposite UE/LE Lift   Assume a hands and knees position on a firm surface. Keep your hands under your shoulders and your knees under your hips. You may place padding under your knees for comfort.  Find your neutral spine and gently tense your abdominal muscles so that you can maintain this position. Your shoulders and hips should form a rectangle that is parallel with the floor and is not twisted.  Keeping your trunk steady, lift your right hand no higher than  your shoulder and then your left leg no higher than your hip. Make sure you are not holding your breath. Hold this position __________ seconds.  Continuing to keep your abdominal muscles tense and your back steady, slowly return to your starting position. Repeat with the opposite arm and leg. Repeat __________ times. Complete this exercise __________ times per day.  STRENGTHENING - Lower Abdominals, Double Knee Lift  Lie on a firm bed or floor. Keeping your legs in front of you, bend your knees so they are both pointed toward the ceiling and your feet are  flat on the floor.  Tense your abdominal muscles to brace your lower back and slowly lift both of your knees until they come over your hips. Be certain not to hold your breath.  Hold __________ seconds. Using your abdominal muscles, return to the starting position in a slow and controlled manner. Repeat __________ times. Complete this exercise __________ times per day.  POSTURE AND BODY MECHANICS CONSIDERATIONS - Low Back Strain Keeping correct posture when sitting, standing or completing your activities will reduce the stress put on different body tissues, allowing injured tissues a chance to heal and limiting painful experiences. The following are general guidelines for improved posture. Your physician or physical therapist will provide you with any instructions specific to your needs. While reading these guidelines, remember:  The exercises prescribed by your provider will help you have the flexibility and strength to maintain correct postures.  The correct posture provides the best environment for your joints to work. All of your joints have less wear and tear when properly supported by a spine with good posture. This means you will experience a healthier, less painful body.  Correct posture must be practiced with all of your activities, especially prolonged sitting and standing. Correct posture is as important when doing repetitive  low-stress activities (typing) as it is when doing a single heavy-load activity (lifting). RESTING POSITIONS Consider which positions are most painful for you when choosing a resting position. If you have pain with flexion-based activities (sitting, bending, stooping, squatting), choose a position that allows you to rest in a less flexed posture. You would want to avoid curling into a fetal position on your side. If your pain worsens with extension-based activities (prolonged standing, working overhead), avoid resting in an extended position such as sleeping on your stomach. Most people will find more comfort when they rest with their spine in a more neutral position, neither too rounded nor too arched. Lying on a non-sagging bed on your side with a pillow between your knees, or on your back with a pillow under your knees will often provide some relief. Keep in mind, being in any one position for a prolonged period of time, no matter how correct your posture, can still lead to stiffness. PROPER SITTING POSTURE In order to minimize stress and discomfort on your spine, you must sit with correct posture. Sitting with good posture should be effortless for a healthy body. Returning to good posture is a gradual process. Many people can work toward this most comfortably by using various supports until they have the flexibility and strength to maintain this posture on their own. When sitting with proper posture, your ears will fall over your shoulders and your shoulders will fall over your hips. You should use the back of the chair to support your upper back. Your lower back will be in a neutral position, just slightly arched. You may place a small pillow or folded towel at the base of your lower back for support.  When working at a desk, create an environment that supports good, upright posture. Without extra support, muscles tire, which leads to excessive strain on joints and other tissues. Keep these  recommendations in mind: CHAIR:  A chair should be able to slide under your desk when your back makes contact with the back of the chair. This allows you to work closely.  The chair's height should allow your eyes to be level with the upper part of your monitor and your hands to be slightly lower than your elbows.  BODY POSITION  Your feet should make contact with the floor. If this is not possible, use a foot rest.  Keep your ears over your shoulders. This will reduce stress on your neck and lower back. INCORRECT SITTING POSTURES  If you are feeling tired and unable to assume a healthy sitting posture, do not slouch or slump. This puts excessive strain on your back tissues, causing more damage and pain. Healthier options include:  Using more support, like a lumbar pillow.  Switching tasks to something that requires you to be upright or walking.  Talking a brief walk.  Lying down to rest in a neutral-spine position. PROLONGED STANDING WHILE SLIGHTLY LEANING FORWARD  When completing a task that requires you to lean forward while standing in one place for a long time, place either foot up on a stationary 2-4 inch high object to help maintain the best posture. When both feet are on the ground, the lower back tends to lose its slight inward curve. If this curve flattens (or becomes too large), then the back and your other joints will experience too much stress, tire more quickly, and can cause pain. CORRECT STANDING POSTURES Proper standing posture should be assumed with all daily activities, even if they only take a few moments, like when brushing your teeth. As in sitting, your ears should fall over your shoulders and your shoulders should fall over your hips. You should keep a slight tension in your abdominal muscles to brace your spine. Your tailbone should point down to the ground, not behind your body, resulting in an over-extended swayback posture.  INCORRECT STANDING POSTURES  Common  incorrect standing postures include a forward head, locked knees and/or an excessive swayback. WALKING Walk with an upright posture. Your ears, shoulders and hips should all line-up. PROLONGED ACTIVITY IN A FLEXED POSITION When completing a task that requires you to bend forward at your waist or lean over a low surface, try to find a way to stabilize 3 out of 4 of your limbs. You can place a hand or elbow on your thigh or rest a knee on the surface you are reaching across. This will provide you more stability so that your muscles do not fatigue as quickly. By keeping your knees relaxed, or slightly bent, you will also reduce stress across your lower back. CORRECT LIFTING TECHNIQUES DO :   Assume a wide stance. This will provide you more stability and the opportunity to get as close as possible to the object which you are lifting.  Tense your abdominals to brace your spine. Bend at the knees and hips. Keeping your back locked in a neutral-spine position, lift using your leg muscles. Lift with your legs, keeping your back straight.  Test the weight of unknown objects before attempting to lift them.  Try to keep your elbows locked down at your sides in order get the best strength from your shoulders when carrying an object.  Always ask for help when lifting heavy or awkward objects. INCORRECT LIFTING TECHNIQUES DO NOT:   Lock your knees when lifting, even if it is a small object.  Bend and twist. Pivot at your feet or move your feet when needing to change directions.  Assume that you can safely pick up even a paper clip without proper posture. Document Released: 03/04/2005 Document Revised: 05/27/2011 Document Reviewed: 06/16/2008 [facility] Patient Information 2015 [place], [place]. This information is not intended to replace advice given to you by your health care provider. Make sure you discuss  any questions you have with your health care provider.  

## 2014-11-24 NOTE — Progress Notes (Signed)
[facility] [address] [place] [address] [phone]  Date:  11/24/2014   Name:  Isaac Hall   DOB:  Nov 11, 1970   MRN:  [id number]  PCP:  Tally Due, MD    History of Present Illness:  Isaac Hall is a 44 y.o. male patient who presents to [facility] for chief complaint of back pain that has not improved.  He was seen 3 days ago for back pain here at [facility] and dxd with low back strain.  He was given flexeril, and voltaren which he states will improve pain, but this will not last.  He denies any dysuria, or radiating testicular pain.  He has some tingling along the right buttocks.  He has not attempted stretches to help with this pain.  No numbness of groin, or incontinence.  No weakness to extremity.    Patient Active Problem List   Diagnosis Date Noted  . Type 2 diabetes mellitus 07/04/2014    History reviewed. No pertinent past medical history.  History reviewed. No pertinent past surgical history.  Social History  Substance Use Topics  . Smoking status: Never Smoker   . Smokeless tobacco: Never Used  . Alcohol Use: No    Family History  Problem Relation Age of Onset  . Cancer Mother   . Diabetes Mother   . Diabetes Father   . Diabetes Sister   . Diabetes Sister   . Diabetes Sister   . Diabetes Sister   . Hypertension Sister     No Known Allergies  Medication list has been reviewed and updated.  Current Outpatient Prescriptions on File Prior to Visit  Medication Sig Dispense Refill  . citalopram (CELEXA) 20 MG tablet TAKE 1 TABLET BY MOUTH ONCE DAILY.  "OV NEEDED" 2ND 30 tablet 0  . cyclobenzaprine (FLEXERIL) 10 MG tablet Take 10 mg by mouth 3 (three) times daily as needed for muscle spasms.    . cyclobenzaprine (FLEXERIL) 5 MG tablet Take 1 tablet (5 mg total) by mouth 3 (three) times daily as needed for muscle spasms. 30 tablet 0  . diclofenac (VOLTAREN) 75 MG EC tablet Take 1 tablet (75 mg total) by mouth 2 (two)  times daily. 30 tablet 0  . ipratropium (ATROVENT) 0.03 % nasal spray Place 2 sprays into the nose 2 (two) times daily. (Patient not taking: Reported on 11/21/2014) 30 mL 0   No current facility-administered medications on file prior to visit.    ROS ROS otherwise unremarkable unless listed above.   Physical Examination: BP 118/80 mmHg  Pulse 84  Temp(Src) 98.2 F (36.8 C) (Oral)  Resp 16  Ht 5\' 4"  (1.626 m)  Wt 192 lb (87.091 kg)  BMI 32.94 kg/m2  SpO2 98% Ideal Body Weight: Weight in (lb) to have BMI = 25: 145.3  Physical Exam  Constitutional: He is oriented to person, place, and time. He appears well-developed and well-nourished. No distress.  HENT:  Head: Normocephalic and atraumatic.  Eyes: Conjunctivae and EOM are normal. Pupils are equal, round, and reactive to light.  Cardiovascular: Normal rate.   Pulmonary/Chest: Effort normal. No respiratory distress.  Musculoskeletal:  No spinous tenderness.  Tender along the right lower lumbar adjacent musculature.  There is muscle tightness and spasm present.   Lateral deviation to the right erupts the pain, however normal rom.  Normal rom with forward flexion and horizontal rotation. Positive straight leg raise test of right leg.    Neurological: He is alert and oriented  to person, place, and time.  Skin: Skin is warm and dry. He is not diaphoretic.  Psychiatric: He has a normal mood and affect. His behavior is normal.     Assessment and Plan: 44 year old male is here today for chief complaint of back pain that has not improved over the last 3 days.  We will start a steroid at this time.  There is palpable muscle spasm.  Continue the voltaren and flexeril.   Stretches and ice instruction given.    Right low back pain, with sciatica presence unspecified - Plan: predniSONE (DELTASONE) 20 MG tablet   Trena Platt, PA-C [facility] 11/24/2014 12:44 PM

## 2014-12-02 ENCOUNTER — Ambulatory Visit (INDEPENDENT_AMBULATORY_CARE_PROVIDER_SITE_OTHER): Payer: BLUE CROSS/BLUE SHIELD | Admitting: Family Medicine

## 2014-12-02 ENCOUNTER — Ambulatory Visit (INDEPENDENT_AMBULATORY_CARE_PROVIDER_SITE_OTHER): Payer: BLUE CROSS/BLUE SHIELD

## 2014-12-02 VITALS — BP 122/76 | HR 94 | Temp 98.1°F | Resp 16 | Ht 64.0 in | Wt 191.8 lb

## 2014-12-02 DIAGNOSIS — R319 Hematuria, unspecified: Secondary | ICD-10-CM | POA: Diagnosis not present

## 2014-12-02 DIAGNOSIS — H6123 Impacted cerumen, bilateral: Secondary | ICD-10-CM | POA: Diagnosis not present

## 2014-12-02 DIAGNOSIS — R109 Unspecified abdominal pain: Secondary | ICD-10-CM | POA: Diagnosis not present

## 2014-12-02 LAB — POCT URINALYSIS DIPSTICK
Bilirubin, UA: NEGATIVE
Glucose, UA: NEGATIVE
Ketones, UA: NEGATIVE
Leukocytes, UA: NEGATIVE
Nitrite, UA: NEGATIVE
Protein, UA: NEGATIVE
Spec Grav, UA: 1.015
Urobilinogen, UA: 1
pH, UA: 7.5

## 2014-12-02 LAB — POCT UA - MICROSCOPIC ONLY
Bacteria, U Microscopic: NEGATIVE
Casts, Ur, LPF, POC: NEGATIVE
Crystals, Ur, HPF, POC: NEGATIVE
Mucus, UA: NEGATIVE
WBC, Ur, HPF, POC: NEGATIVE
Yeast, UA: NEGATIVE

## 2014-12-02 MED ORDER — TAMSULOSIN HCL 0.4 MG PO CAPS: 0.4000 mg | ORAL_CAPSULE | Freq: Every day | ORAL | Status: DC

## 2014-12-02 MED ORDER — HYDROCODONE-ACETAMINOPHEN 5-325 MG PO TABS: 1.0000 | ORAL_TABLET | Freq: Four times a day (QID) | ORAL | Status: DC | PRN

## 2014-12-02 MED ORDER — PREDNISONE 20 MG PO TABS: ORAL_TABLET | ORAL | Status: DC

## 2014-12-02 NOTE — Progress Notes (Addendum)
Patient ID: Isaac Hall, male   DOB: 10-Mar-1971, 44 y.o.   MRN: [id number]  This chart was scribed for Elvina Sidle, MD by Charline Bills, ED Scribe. The patient was seen in room 14. Patient's care was started at 2:46 PM.  Patient ID: Isaac Hall MRN: [id number], DOB: 08-17-1970, 44 y.o. Date of Encounter: 12/02/2014, 2:46 PM  Primary Physician: Tally Due, MD  Chief Complaint  Patient presents with  . Follow-up    back pain  . Cerumen Impaction   HPI: 44 y.o. year old male with history below presents for a follow-up regarding back pain. Pt states that initial back pain has improved, however, he reports new gradually worsening left flank pain for the past 2 days. He reports that pain is exacerbated with bending, twisting and palpation but improved with lying down. No treatments tried PTA. Pt denies cough, nausea, abdominal pain. Level of pain now is 8/10.  He also would like to have his ears cleaned out. He's had decreased hearing for last couple days without tinnitus or vertigo. He's had earwax problem before.  History reviewed. No pertinent past medical history. patient notes that he's had chronic hematuria and has had a full workup by the urologist without a subsequent diagnosis explaining this.  Home Meds: Prior to Admission medications   Medication Sig Start Date End Date Taking? Authorizing Provider  citalopram (CELEXA) 20 MG tablet TAKE 1 TABLET BY MOUTH ONCE DAILY.  "OV NEEDED" 2ND 10/12/14  Yes Chelle Jeffery, PA-C  cyclobenzaprine (FLEXERIL) 10 MG tablet Take 10 mg by mouth 3 (three) times daily as needed for muscle spasms.   Yes Historical Provider, MD  cyclobenzaprine (FLEXERIL) 5 MG tablet Take 1 tablet (5 mg total) by mouth 3 (three) times daily as needed for muscle spasms. 11/21/14  Yes Elvina Sidle, MD  diclofenac (VOLTAREN) 75 MG EC tablet Take 1 tablet (75 mg total) by mouth 2 (two) times daily. 11/21/14  Yes Elvina Sidle, MD    Allergies:  No Known Allergies  Social History   Social History  . Marital Status: Married    Spouse Name: Corrie Dandy  . Number of Children: 2  . Years of Education: 12th grade   Occupational History  . MACHINE OPERATOR    Social History Main Topics  . Smoking status: Never Smoker   . Smokeless tobacco: Never Used  . Alcohol Use: No  . Drug Use: No  . Sexual Activity: Not on file   Other Topics Concern  . Not on file   Social History Narrative   Lives with his wife and their 2 children.   Originally from [place].  Came to the [place] in 1995.     Review of Systems: Constitutional: negative for chills, fever, night sweats, weight changes, or fatigue  HEENT: negative for vision changes, hearing loss, congestion, rhinorrhea, ST, epistaxis, or sinus pressure Cardiovascular: negative for chest pain or palpitations Respiratory: negative for cough, hemoptysis, wheezing, shortness of breath Abdominal: negative for abdominal pain, nausea, vomiting, diarrhea, or constipation Msk: + back pain GU: + flank pain Dermatological: negative for rash Neurologic: negative for headache, dizziness, or syncope All other systems reviewed and are otherwise negative with the exception to those above and in the HPI.  Physical Exam: Blood pressure 122/76, pulse 94, temperature 98.1 F (36.7 C), temperature source Oral, resp. rate 16, height  (1.626 m), weight 191 lb 12.8 oz (87 kg), SpO2 98 %., Body mass index is 32.91 kg/(m^2). General: Well developed, well nourished, in  no acute distress. Head: Normocephalic, atraumatic, eyes without discharge, sclera non-icteric, nares are without discharge. Bilateral TMs with cerumen impaction. Wax was removed with lavage from both ear canals. Oral cavity moist, posterior pharynx without exudate, erythema, peritonsillar abscess, or post nasal drip.  Neck: Supple. No thyromegaly. Full ROM. No lymphadenopathy. Lungs: Clear bilaterally to auscultation without wheezes, rales, or  rhonchi. Breathing is unlabored. Heart: RRR with S1 S2. No murmurs, rubs, or gallops appreciated. Abdomen: Soft, non-tender, non-distended with normoactive bowel sounds. No hepatomegaly. No rebound/guarding. No obvious abdominal masses. Msk:  Strength and tone normal for age. Tender in L CVA area.  Extremities/Skin: Warm and dry. No clubbing or cyanosis. No edema. No rashes or suspicious lesions. Neuro: Alert and oriented X 3. Moves all extremities spontaneously. Gait is normal. CNII-XII grossly in tact. Negative straight leg raising Psych:  Responds to questions appropriately with a normal affect.   Labs: Results for orders placed or performed in visit on 12/02/14  POCT urinalysis dipstick  Result Value Ref Range   Color, UA yellow    Clarity, UA clear    Glucose, UA neg    Bilirubin, UA neg    Ketones, UA neg    Spec Grav, UA 1.015    Blood, UA small    pH, UA 7.5    Protein, UA neg    Urobilinogen, UA 1.0    Nitrite, UA neg    Leukocytes, UA Negative Negative  POCT UA - Microscopic Only  Result Value Ref Range   WBC, Ur, HPF, POC neg    RBC, urine, microscopic 0-2    Bacteria, U Microscopic neg    Mucus, UA neg    Epithelial cells, urine per micros 0-1    Crystals, Ur, HPF, POC neg    Casts, Ur, LPF, POC neg    Yeast, UA neg    [facility] reading (PRIMARY) by  Dr. Milus Glazier: . Abdominal film is unremarkable. There is perhaps a little extra stool in the right side but there is nothing that would explain the left flank pain. The L/S spine films do show some mild disc space narrowing between L2 and L3 and some early spondylosis, but the contour is normal without scoliosis.    ASSESSMENT AND PLAN:  44 y.o. year old male with  1. Hematuria   2. Cerumen impaction, bilateral    This chart was scribed in my presence and reviewed by me personally.    ICD-9-CM ICD-10-CM   1. Hematuria 599.70 R31.9 POCT urinalysis dipstick     POCT UA - Microscopic Only     DG Lumbar Spine 2-3  Views     DG Abd 1 View  2. Cerumen impaction, bilateral 380.4 H61.23 Ear wax removal      Signed, Elvina Sidle, MD 12/02/2014 2:46 PM

## 2014-12-02 NOTE — Patient Instructions (Signed)
Your pain is consistent with a kidney stone. We do not see this on the x-ray but some of the stones are translucent and do not show. If you continue pain or if it worsens, would like you to return or go to the emergency room. Hopefully the medicine may provide well help pass a stone and relieved her pain.  The ears are now clear of ear wax.

## 2015-05-01 ENCOUNTER — Other Ambulatory Visit: Payer: Self-pay

## 2015-05-01 MED ORDER — CITALOPRAM HYDROBROMIDE 20 MG PO TABS: ORAL_TABLET | ORAL | Status: DC

## 2015-06-10 ENCOUNTER — Ambulatory Visit (INDEPENDENT_AMBULATORY_CARE_PROVIDER_SITE_OTHER): Payer: BLUE CROSS/BLUE SHIELD | Admitting: Physician Assistant

## 2015-06-10 VITALS — BP 122/86 | HR 82 | Temp 98.4°F | Resp 16 | Ht 64.0 in | Wt 183.0 lb

## 2015-06-10 DIAGNOSIS — F418 Other specified anxiety disorders: Secondary | ICD-10-CM | POA: Diagnosis not present

## 2015-06-10 DIAGNOSIS — E119 Type 2 diabetes mellitus without complications: Secondary | ICD-10-CM

## 2015-06-10 DIAGNOSIS — E786 Lipoprotein deficiency: Secondary | ICD-10-CM | POA: Diagnosis not present

## 2015-06-10 DIAGNOSIS — R7309 Other abnormal glucose: Secondary | ICD-10-CM

## 2015-06-10 LAB — CBC WITH DIFFERENTIAL/PLATELET
BASOS ABS: 0 10*3/uL (ref 0.0–0.1)
BASOS PCT: 0 % (ref 0–1)
EOS PCT: 3 % (ref 0–5)
Eosinophils Absolute: 0.2 10*3/uL (ref 0.0–0.7)
HCT: 48.6 % (ref 39.0–52.0)
Hemoglobin: 16.9 g/dL (ref 13.0–17.0)
Lymphocytes Relative: 31 % (ref 12–46)
Lymphs Abs: 1.7 10*3/uL (ref 0.7–4.0)
MCH: 30.6 pg (ref 26.0–34.0)
MCHC: 34.8 g/dL (ref 30.0–36.0)
MCV: 88 fL (ref 78.0–100.0)
MONO ABS: 0.3 10*3/uL (ref 0.1–1.0)
MPV: 11 fL (ref 8.6–12.4)
Monocytes Relative: 5 % (ref 3–12)
NEUTROS ABS: 3.4 10*3/uL (ref 1.7–7.7)
Neutrophils Relative %: 61 % (ref 43–77)
Platelets: 177 10*3/uL (ref 150–400)
RBC: 5.52 MIL/uL (ref 4.22–5.81)
RDW: 12.8 % (ref 11.5–15.5)
WBC: 5.5 10*3/uL (ref 4.0–10.5)

## 2015-06-10 LAB — COMPLETE METABOLIC PANEL WITH GFR
ALT: 64 U/L — AB (ref 9–46)
AST: 39 U/L (ref 10–40)
Albumin: 4.3 g/dL (ref 3.6–5.1)
Alkaline Phosphatase: 90 U/L (ref 40–115)
BUN: 10 mg/dL (ref 7–25)
CHLORIDE: 99 mmol/L (ref 98–110)
CO2: 25 mmol/L (ref 20–31)
CREATININE: 0.79 mg/dL (ref 0.60–1.35)
Calcium: 9 mg/dL (ref 8.6–10.3)
GFR, Est African American: >89 mL/min (ref >=60)
GFR, Est Non African American: >89 mL/min (ref >=60)
GLUCOSE: 259 mg/dL — AB (ref 65–99)
POTASSIUM: 4.4 mmol/L (ref 3.5–5.3)
SODIUM: 133 mmol/L — AB (ref 135–146)
Total Bilirubin: 0.5 mg/dL (ref 0.2–1.2)
Total Protein: 7.4 g/dL (ref 6.1–8.1)

## 2015-06-10 LAB — GLUCOSE, POCT (MANUAL RESULT ENTRY): POC GLUCOSE: 274 mg/dL — AB (ref 70–99)

## 2015-06-10 LAB — POCT URINALYSIS DIP (MANUAL ENTRY)
BILIRUBIN UA: NEGATIVE
Glucose, UA: 500 — AB
Leukocytes, UA: NEGATIVE
Nitrite, UA: NEGATIVE
Spec Grav, UA: 1.015
Urobilinogen, UA: 0.2
pH, UA: 6.5

## 2015-06-10 LAB — LIPID PANEL
CHOL/HDL RATIO: 6.3 ratio — AB (ref <=5.0)
Cholesterol: 170 mg/dL (ref 125–200)
HDL: 27 mg/dL — ABNORMAL LOW (ref >=40)
LDL CALC: 115 mg/dL (ref <130)
Triglycerides: 141 mg/dL (ref <150)
VLDL: 28 mg/dL (ref <30)

## 2015-06-10 LAB — POCT GLYCOSYLATED HEMOGLOBIN (HGB A1C): HEMOGLOBIN A1C: 10.9

## 2015-06-10 MED ORDER — METFORMIN HCL 500 MG PO TABS: ORAL_TABLET | ORAL | Status: DC

## 2015-06-10 MED ORDER — METFORMIN HCL 1000 MG PO TABS: 1000.0000 mg | ORAL_TABLET | Freq: Two times a day (BID) | ORAL | Status: DC

## 2015-06-10 MED ORDER — CITALOPRAM HYDROBROMIDE 20 MG PO TABS: ORAL_TABLET | ORAL | Status: DC

## 2015-06-10 MED ORDER — BLOOD GLUCOSE METER KIT
PACK | Status: DC
Start: 2015-06-10 — End: 2017-07-29

## 2015-06-10 MED ORDER — LISINOPRIL 5 MG PO TABS: 5.0000 mg | ORAL_TABLET | Freq: Every day | ORAL | Status: DC

## 2015-06-10 NOTE — Patient Instructions (Signed)
Diabetes Mellitus and Food It is important for you to manage your blood sugar (glucose) level. Your blood glucose level can be greatly affected by what you eat. Eating healthier foods in the appropriate amounts throughout the day at about the same time each day will help you control your blood glucose level. It can also help slow or prevent worsening of your diabetes mellitus. Healthy eating may even help you improve the level of your blood pressure and reach or maintain a healthy weight.  General recommendations for healthful eating and cooking habits include:  Eating meals and snacks regularly. Avoid going long periods of time without eating to lose weight.  Eating a diet that consists mainly of plant-based foods, such as fruits, vegetables, nuts, legumes, and whole grains.  Using low-heat cooking methods, such as baking, instead of high-heat cooking methods, such as deep frying. Work with your dietitian to make sure you understand how to use the Nutrition Facts information on food labels. HOW CAN FOOD AFFECT ME? Carbohydrates Carbohydrates affect your blood glucose level more than any other type of food. Your dietitian will help you determine how many carbohydrates to eat at each meal and teach you how to count carbohydrates. Counting carbohydrates is important to keep your blood glucose at a healthy level, especially if you are using insulin or taking certain medicines for diabetes mellitus. Alcohol Alcohol can cause sudden decreases in blood glucose (hypoglycemia), especially if you use insulin or take certain medicines for diabetes mellitus. Hypoglycemia can be a life-threatening condition. Symptoms of hypoglycemia (sleepiness, dizziness, and disorientation) are similar to symptoms of having too much alcohol.  If your health care provider has given you approval to drink alcohol, do so in moderation and use the following guidelines:  Women should not have more than one drink per day, and men  should not have more than two drinks per day. One drink is equal to:  12 oz of beer.  5 oz of wine.  1 oz of hard liquor.  Do not drink on an empty stomach.  Keep yourself hydrated. Have water, diet soda, or unsweetened iced tea.  Regular soda, juice, and other mixers might contain a lot of carbohydrates and should be counted. WHAT FOODS ARE NOT RECOMMENDED? As you make food choices, it is important to remember that all foods are not the same. Some foods have fewer nutrients per serving than other foods, even though they might have the same number of calories or carbohydrates. It is difficult to get your body what it needs when you eat foods with fewer nutrients. Examples of foods that you should avoid that are high in calories and carbohydrates but low in nutrients include:  Trans fats (most processed foods list trans fats on the Nutrition Facts label).  Regular soda.  Juice.  Candy.  Sweets, such as cake, pie, doughnuts, and cookies.  Fried foods. WHAT FOODS CAN I EAT? Eat nutrient-rich foods, which will nourish your body and keep you healthy. The food you should eat also will depend on several factors, including:  The calories you need.  The medicines you take.  Your weight.  Your blood glucose level.  Your blood pressure level.  Your cholesterol level. You should eat a variety of foods, including:  Protein.  Lean cuts of meat.  Proteins low in saturated fats, such as fish, egg whites, and beans. Avoid processed meats.  Fruits and vegetables.  Fruits and vegetables that may help control blood glucose levels, such as apples, mangoes, and   yams.  Dairy products.  Choose fat-free or low-fat dairy products, such as milk, yogurt, and cheese.  Grains, bread, pasta, and rice.  Choose whole grain products, such as multigrain bread, whole oats, and brown rice. These foods may help control blood pressure.  Fats.  Foods containing healthful fats, such as nuts,  avocado, olive oil, canola oil, and fish. DOES EVERYONE WITH DIABETES MELLITUS HAVE THE SAME MEAL PLAN? Because every person with diabetes mellitus is different, there is not one meal plan that works for everyone. It is very important that you meet with a dietitian who will help you create a meal plan that is just right for you.   This information is not intended to replace advice given to you by your health care provider. Make sure you discuss any questions you have with your health care provider.   Document Released: 11/29/2004 Document Revised: 03/25/2014 Document Reviewed: 01/29/2013 Elsevier Interactive Patient Education 2016 Elsevier Inc.  

## 2015-06-10 NOTE — Progress Notes (Signed)
Isaac Hall  MRN: [id number] DOB: Jul 13, 1970  Subjective:  Pt presents to clinic with multiple concerns Needs a refill of his Celexa - doing well for him. Has had elevated glucose readings - glucose 333 yesterday am -- over the last week he has been checking them and most have been around 300.  He has been having a swimmy head sensation and some inability to visually focus.  Mother and father and siblings all have DM  Patient Active Problem List   Diagnosis Date Noted  . Type 2 diabetes mellitus ([place]) 07/04/2014    No current outpatient prescriptions on file prior to visit.   No current facility-administered medications on file prior to visit.    No Known Allergies  Review of Systems  Constitutional: Negative for fever and chills.  Eyes: Positive for visual disturbance.  Endocrine: Positive for polydipsia (mild) and polyuria.  Neurological: Positive for light-headedness.   Objective:  BP 122/86 mmHg  Pulse 82  Temp(Src) 98.4 F (36.9 C)  Resp 16  Ht _0  (1.626 m)  Wt 183 lb (83.008 kg)  BMI 31.40 kg/m2  SpO2 99%  Physical Exam  Constitutional: He is oriented to person, place, and time and well-developed, well-nourished, and in no distress.  HENT:  Head: Normocephalic and atraumatic.  Right Ear: External ear normal.  Left Ear: External ear normal.  Eyes: Conjunctivae, EOM and lids are normal. Pupils are equal, round, and reactive to light.  Neck: Normal range of motion.  Cardiovascular: Normal rate, regular rhythm and normal heart sounds.   Pulmonary/Chest: Effort normal and breath sounds normal.  Neurological: He is alert and oriented to person, place, and time. Gait normal.  Skin: Skin is warm and dry.  Psychiatric: Mood, memory, affect and judgment normal.    Results for orders placed or performed in visit on 06/10/15  POCT glycosylated hemoglobin (Hb A1C)  Result Value Ref Range   Hemoglobin A1C 10.9   POCT glucose (manual entry)  Result  Value Ref Range   POC Glucose 274 (A) 70 - 99 mg/dl  POCT urinalysis dipstick  Result Value Ref Range   Color, UA yellow yellow   Clarity, UA clear clear   Glucose, UA =500 (A) negative   Bilirubin, UA negative negative   Ketones, POC UA trace (5) (A) negative   Spec Grav, UA 1.015    Blood, UA small (A) negative   pH, UA 6.5    Protein Ur, POC trace (A) negative   Urobilinogen, UA 0.2    Nitrite, UA Negative Negative   Leukocytes, UA Negative Negative   ; Assessment and Plan :  Type 2 diabetes mellitus without complication, without long-term current use of insulin ([place]) - Plan: POCT glycosylated hemoglobin (Hb A1C), CBC with Differential/Platelet, COMPLETE METABOLIC PANEL WITH GFR, Lipid panel, POCT glucose (manual entry), metFORMIN (GLUCOPHAGE) 500 MG tablet, lisinopril (PRINIVIL,ZESTRIL) 5 MG tablet, metFORMIN (GLUCOPHAGE) 1000 MG tablet, Care order/instruction, blood glucose meter kit and supplies  Depression with anxiety - Plan: citalopram (CELEXA) 20 MG tablet - continue current medications as they are helping the patient  Elevated glucose - Plan: POCT urinalysis dipstick  Pt DM is no longer controlled by diet.  We talked about diet changes that he should make along with the medications that we started today.  He will titrate the Metformin over the next couple of week and continue to check his glucose.  If it is staying mid 150s then he can RTCin 3 months - if it is higher  than 200 in about 3 weeks he should RTC in about a month for possible addition of medications.  He should also RTC sooner if his symptoms do not resolve but I expect them to improve as his glucose drops.  We started lisinopril at low dose today for kidney protection.  Windell Hummingbird PA-C  [facility] 06/10/2015 11:25 AM

## 2015-06-11 ENCOUNTER — Encounter: Payer: Self-pay | Admitting: Physician Assistant

## 2015-06-11 MED ORDER — ATORVASTATIN CALCIUM 10 MG PO TABS: 10.0000 mg | ORAL_TABLET | Freq: Every day | ORAL | Status: DC

## 2015-06-11 NOTE — Addendum Note (Signed)
Addended by: Benny LennertWEBER,  L on: 06/11/2015 10:30 AM   Modules accepted: Orders

## 2015-10-23 ENCOUNTER — Ambulatory Visit (INDEPENDENT_AMBULATORY_CARE_PROVIDER_SITE_OTHER): Payer: BLUE CROSS/BLUE SHIELD | Admitting: Urgent Care

## 2015-10-23 ENCOUNTER — Telehealth: Payer: Self-pay | Admitting: Family Medicine

## 2015-10-23 VITALS — BP 120/84 | HR 90 | Temp 97.9°F | Resp 18 | Ht 64.0 in | Wt 191.0 lb

## 2015-10-23 DIAGNOSIS — N3090 Cystitis, unspecified without hematuria: Secondary | ICD-10-CM

## 2015-10-23 DIAGNOSIS — R300 Dysuria: Secondary | ICD-10-CM

## 2015-10-23 DIAGNOSIS — E1165 Type 2 diabetes mellitus with hyperglycemia: Secondary | ICD-10-CM | POA: Diagnosis not present

## 2015-10-23 DIAGNOSIS — IMO0001 Reserved for inherently not codable concepts without codable children: Secondary | ICD-10-CM

## 2015-10-23 LAB — POC MICROSCOPIC URINALYSIS (UMFC): Mucus: ABSENT

## 2015-10-23 LAB — POCT URINALYSIS DIP (MANUAL ENTRY)
BILIRUBIN UA: NEGATIVE
BILIRUBIN UA: NEGATIVE
GLUCOSE UA: NEGATIVE
LEUKOCYTES UA: NEGATIVE
Nitrite, UA: POSITIVE — AB
PH UA: 6
Spec Grav, UA: 1.025
Urobilinogen, UA: 0.2

## 2015-10-23 LAB — COMPLETE METABOLIC PANEL WITH GFR
ALBUMIN: 4.3 g/dL (ref 3.6–5.1)
ALT: 65 U/L — AB (ref 9–46)
AST: 36 U/L (ref 10–40)
Alkaline Phosphatase: 78 U/L (ref 40–115)
BILIRUBIN TOTAL: 0.4 mg/dL (ref 0.2–1.2)
BUN: 11 mg/dL (ref 7–25)
CO2: 26 mmol/L (ref 20–31)
CREATININE: 0.73 mg/dL (ref 0.60–1.35)
Calcium: 9.1 mg/dL (ref 8.6–10.3)
Chloride: 101 mmol/L (ref 98–110)
GFR, Est Non African American: >89 mL/min (ref >=60)
GLUCOSE: 155 mg/dL — AB (ref 65–99)
Potassium: 4.4 mmol/L (ref 3.5–5.3)
Sodium: 136 mmol/L (ref 135–146)
TOTAL PROTEIN: 7.1 g/dL (ref 6.1–8.1)

## 2015-10-23 LAB — POCT GLYCOSYLATED HEMOGLOBIN (HGB A1C): HEMOGLOBIN A1C: 7.2

## 2015-10-23 MED ORDER — GLUCOSE BLOOD VI STRP: ORAL_STRIP | 12 refills | Status: DC

## 2015-10-23 MED ORDER — DAPAGLIFLOZIN PRO-METFORMIN ER 5-1000 MG PO TB24: 1.0000 | ORAL_TABLET | Freq: Two times a day (BID) | ORAL | 3 refills | Status: DC

## 2015-10-23 MED ORDER — LISINOPRIL 5 MG PO TABS: 5.0000 mg | ORAL_TABLET | Freq: Every day | ORAL | 3 refills | Status: DC

## 2015-10-23 MED ORDER — CIPROFLOXACIN HCL 500 MG PO TABS
500.0000 mg | ORAL_TABLET | Freq: Two times a day (BID) | ORAL | 0 refills | Status: DC
Start: 2015-10-23 — End: 2015-11-01

## 2015-10-23 MED ORDER — DAPAGLIFLOZIN PRO-METFORMIN ER 5-1000 MG PO TB24: 1.0000 | ORAL_TABLET | Freq: Every day | ORAL | 3 refills | Status: DC

## 2015-10-23 NOTE — Telephone Encounter (Signed)
Ani is this the correct metformin you wanted to RX? Is there a reason she cannot take plain Metformin?

## 2015-10-23 NOTE — Telephone Encounter (Signed)
Pt called wanting to know if verification came thru from [facility] stating that insurance want cover Metformin please call pt at [phone]  When this get taken care of

## 2015-10-23 NOTE — Progress Notes (Signed)
MRN: [id number] DOB: 1970-07-14  Subjective:   Isaac Hall is a 45 y.o. male presenting for chief complaint of Dysuria; Urinary Frequency (Started last weekend); and Other (Pt requesting A1C recheck)  Urinary problem - Reports 9 day history of dysuria, urinary frequency. He has tried Azo with some relief. However, his symptoms recurred yesterday including dysuria, urinary frequency, darker urine, body aches. Patient is not medically compliant. Not taking any of his diabetes medications. States that he stopped his medications because he felt his blood sugar was very low, but has not been checking it. Admits polydipsia. Denies blurred vision, chest pain, shob, n/v, abdominal pain, numbness or tingling, skin infections.   Isaac Hall has a current medication list which includes the following prescription(s): citalopram, atorvastatin, blood glucose meter kit and supplies, lisinopril, metformin, and metformin. Also has No Known Allergies.  Isaac Hall  has no past medical history on file. Also  has no past surgical history on file.  Objective:   Vitals: BP 120/84 (BP Location: Left Arm, Patient Position: Sitting, Cuff Size: Normal)   Pulse 90   Temp 97.9 F (36.6 C) (Oral)   Resp 18   Ht '5\' 4"'$  (1.626 m)   Wt 191 lb (86.6 kg)   SpO2 94%   BMI 32.79 kg/m   Physical Exam  Constitutional: He is oriented to person, place, and time. He appears well-developed and well-nourished.  HENT:  Mouth/Throat: Oropharynx is clear and moist.  Eyes: No scleral icterus.  Cardiovascular: Normal rate, regular rhythm and intact distal pulses.  Exam reveals no gallop and no friction rub.   No murmur heard. Pulmonary/Chest: No respiratory distress. He has no wheezes. He has no rales.  Abdominal: Soft. Bowel sounds are normal. He exhibits no distension and no mass. There is no tenderness.  Neurological: He is alert and oriented to person, place, and time.  Skin: Skin is warm and dry.   Results for  orders placed or performed in visit on 10/23/15 (from the past 24 hour(s))  POCT urinalysis dipstick     Status: Abnormal   Collection Time: 10/23/15  8:23 AM  Result Value Ref Range   Color, UA yellow yellow   Clarity, UA clear clear   Glucose, UA negative negative   Bilirubin, UA negative negative   Ketones, POC UA negative negative   Spec Grav, UA 1.025    Blood, UA trace-intact (A) negative   pH, UA 6.0    Protein Ur, POC trace (A) negative   Urobilinogen, UA 0.2    Nitrite, UA Positive (A) Negative   Leukocytes, UA Negative Negative  POCT Microscopic Urinalysis ([facility])     Status: Abnormal   Collection Time: 10/23/15  8:24 AM  Result Value Ref Range   WBC,UR,HPF,POC None None WBC/hpf   RBC,UR,HPF,POC Few (A) None RBC/hpf   Bacteria Few (A) None, Too numerous to count   Mucus Absent Absent   Epithelial Cells, UR Per Microscopy None None, Too numerous to count cells/hpf  POCT glycosylated hemoglobin (Hb A1C)     Status: None   Collection Time: 10/23/15  8:43 AM  Result Value Ref Range   Hemoglobin A1C 7.2    Assessment and Plan :   1. Cystitis 2. Dysuria - Start ciprofloxacin for UTI, urine culture pending. Advised aggressive hydration.  3. Uncontrolled type 2 diabetes mellitus without complication, without long-term current use of insulin ([place]) - Restart medication, Xigduo once daily. Also use lisinopril. Recheck in 3 months.  Isaac Eagles, PA-C [facility] [phone] 10/23/2015 8:19 AM

## 2015-10-23 NOTE — Patient Instructions (Addendum)
Urinary Tract Infection Urinary tract infections (UTIs) can develop anywhere along your urinary tract. Your urinary tract is your body's drainage system for removing wastes and extra water. Your urinary tract includes two kidneys, two ureters, a bladder, and a urethra. Your kidneys are a pair of bean-shaped organs. Each kidney is about the size of your fist. They are located below your ribs, one on each side of your spine. CAUSES Infections are caused by microbes, which are microscopic organisms, including fungi, viruses, and bacteria. These organisms are so small that they can only be seen through a microscope. Bacteria are the microbes that most commonly cause UTIs. SYMPTOMS  Symptoms of UTIs may vary by age and gender of the patient and by the location of the infection. Symptoms in young women typically include a frequent and intense urge to urinate and a painful, burning feeling in the bladder or urethra during urination. Older women and men are more likely to be tired, shaky, and weak and have muscle aches and abdominal pain. A fever may mean the infection is in your kidneys. Other symptoms of a kidney infection include pain in your back or sides below the ribs, nausea, and vomiting. DIAGNOSIS To diagnose a UTI, your caregiver will ask you about your symptoms. Your caregiver will also ask you to provide a urine sample. The urine sample will be tested for bacteria and white blood cells. White blood cells are made by your body to help fight infection. TREATMENT  Typically, UTIs can be treated with medication. Because most UTIs are caused by a bacterial infection, they usually can be treated with the use of antibiotics. The choice of antibiotic and length of treatment depend on your symptoms and the type of bacteria causing your infection. HOME CARE INSTRUCTIONS  If you were prescribed antibiotics, take them exactly as your caregiver instructs you. Finish the medication even if you feel better after  you have only taken some of the medication.  Drink enough water and fluids to keep your urine clear or pale yellow.  Avoid caffeine, tea, and carbonated beverages. They tend to irritate your bladder.  Empty your bladder often. Avoid holding urine for long periods of time.  Empty your bladder before and after sexual intercourse.  After a bowel movement, women should cleanse from front to back. Use each tissue only once. SEEK MEDICAL CARE IF:   You have back pain.  You develop a fever.  Your symptoms do not begin to resolve within 3 days. SEEK IMMEDIATE MEDICAL CARE IF:   You have severe back pain or lower abdominal pain.  You develop chills.  You have nausea or vomiting.  You have continued burning or discomfort with urination. MAKE SURE YOU:   Understand these instructions.  Will watch your condition.  Will get help right away if you are not doing well or get worse.   This information is not intended to replace advice given to you by your health care provider. Make sure you discuss any questions you have with your health care provider.   Document Released: 12/12/2004 Document Revised: 11/23/2014 Document Reviewed: 04/12/2011 Elsevier Interactive Patient Education 2016 [employer].    [place] - Take 1 tablet daily.   Diabetes Mellitus and Food It is important for you to manage your blood sugar (glucose) level. Your blood glucose level can be greatly affected by what you eat. Eating healthier foods in the appropriate amounts throughout the day at about the same time each day will help you control your  blood glucose level. It can also help slow or prevent worsening of your diabetes mellitus. Healthy eating may even help you improve the level of your blood pressure and reach or maintain a healthy weight.  General recommendations for healthful eating and cooking habits include:  Eating meals and snacks regularly. Avoid going long periods of time without eating to lose  weight.  Eating a diet that consists mainly of plant-based foods, such as fruits, vegetables, nuts, legumes, and whole grains.  Using low-heat cooking methods, such as baking, instead of high-heat cooking methods, such as deep frying. Work with your dietitian to make sure you understand how to use the Nutrition Facts information on food labels. HOW CAN FOOD AFFECT ME? Carbohydrates Carbohydrates affect your blood glucose level more than any other type of food. Your dietitian will help you determine how many carbohydrates to eat at each meal and teach you how to count carbohydrates. Counting carbohydrates is important to keep your blood glucose at a healthy level, especially if you are using insulin or taking certain medicines for diabetes mellitus. Alcohol Alcohol can cause sudden decreases in blood glucose (hypoglycemia), especially if you use insulin or take certain medicines for diabetes mellitus. Hypoglycemia can be a life-threatening condition. Symptoms of hypoglycemia (sleepiness, dizziness, and disorientation) are similar to symptoms of having too much alcohol.  If your health care provider has given you approval to drink alcohol, do so in moderation and use the following guidelines:  Women should not have more than one drink per day, and men should not have more than two drinks per day. One drink is equal to:  12 oz of beer.  5 oz of wine.  1 oz of hard liquor.  Do not drink on an empty stomach.  Keep yourself hydrated. Have water, diet soda, or unsweetened iced tea.  Regular soda, juice, and other mixers might contain a lot of carbohydrates and should be counted. WHAT FOODS ARE NOT RECOMMENDED? As you make food choices, it is important to remember that all foods are not the same. Some foods have fewer nutrients per serving than other foods, even though they might have the same number of calories or carbohydrates. It is difficult to get your body what it needs when you eat foods  with fewer nutrients. Examples of foods that you should avoid that are high in calories and carbohydrates but low in nutrients include:  Trans fats (most processed foods list trans fats on the Nutrition Facts label).  Regular soda.  Juice.  Candy.  Sweets, such as cake, pie, doughnuts, and cookies.  Fried foods. WHAT FOODS CAN I EAT? Eat nutrient-rich foods, which will nourish your body and keep you healthy. The food you should eat also will depend on several factors, including:  The calories you need.  The medicines you take.  Your weight.  Your blood glucose level.  Your blood pressure level.  Your cholesterol level. You should eat a variety of foods, including:  Protein.  Lean cuts of meat.  Proteins low in saturated fats, such as fish, egg whites, and beans. Avoid processed meats.  Fruits and vegetables.  Fruits and vegetables that may help control blood glucose levels, such as apples, mangoes, and yams.  Dairy products.  Choose fat-free or low-fat dairy products, such as milk, yogurt, and cheese.  Grains, bread, pasta, and rice.  Choose whole grain products, such as multigrain bread, whole oats, and brown rice. These foods may help control blood pressure.  Fats.  Foods containing  healthful fats, such as nuts, avocado, olive oil, canola oil, and fish. DOES EVERYONE WITH DIABETES MELLITUS HAVE THE SAME MEAL PLAN? Because every person with diabetes mellitus is different, there is not one meal plan that works for everyone. It is very important that you meet with a dietitian who will help you create a meal plan that is just right for you.   This information is not intended to replace advice given to you by your health care provider. Make sure you discuss any questions you have with your health care provider.   Document Released: 11/29/2004 Document Revised: 03/25/2014 Document Reviewed: 01/29/2013 Elsevier Interactive Patient Education 2016 [employer].     IF you received an x-ray today, you will receive an invoice from [facility]. Please contact [facility] at [phone] with questions or concerns regarding your invoice.   IF you received labwork today, you will receive an invoice from [employer] [facility]. Please contact [facility] at [phone] with questions or concerns regarding your invoice.   Our billing staff will not be able to assist you with questions regarding bills from these companies.  You will be contacted with the lab results as soon as they are available. The fastest way to get your results is to activate your My Chart account. Instructions are located on the last page of this paperwork. If you have not heard from [place] regarding the results in 2 weeks, please contact this office.

## 2015-10-24 LAB — URINE CULTURE

## 2015-10-25 ENCOUNTER — Other Ambulatory Visit: Payer: Self-pay | Admitting: Urgent Care

## 2015-10-25 MED ORDER — METFORMIN HCL 500 MG PO TABS: 500.0000 mg | ORAL_TABLET | Freq: Two times a day (BID) | ORAL | 3 refills | Status: DC

## 2015-10-25 NOTE — Telephone Encounter (Signed)
I didn't realize he didn't have insurance. I'll send a script for Metformin  BID.

## 2015-10-27 ENCOUNTER — Encounter: Payer: Self-pay | Admitting: Urgent Care

## 2015-10-27 NOTE — Telephone Encounter (Signed)
Pt advised.

## 2015-11-01 ENCOUNTER — Ambulatory Visit (INDEPENDENT_AMBULATORY_CARE_PROVIDER_SITE_OTHER): Payer: BLUE CROSS/BLUE SHIELD | Admitting: Urgent Care

## 2015-11-01 VITALS — Temp 98.5°F | Resp 16 | Ht 64.0 in | Wt 186.0 lb

## 2015-11-01 DIAGNOSIS — R300 Dysuria: Secondary | ICD-10-CM

## 2015-11-01 DIAGNOSIS — R21 Rash and other nonspecific skin eruption: Secondary | ICD-10-CM | POA: Diagnosis not present

## 2015-11-01 DIAGNOSIS — N3090 Cystitis, unspecified without hematuria: Secondary | ICD-10-CM

## 2015-11-01 LAB — POC MICROSCOPIC URINALYSIS (UMFC): MUCUS RE: ABSENT

## 2015-11-01 LAB — POCT URINALYSIS DIP (MANUAL ENTRY)
BILIRUBIN UA: NEGATIVE
Glucose, UA: NEGATIVE
Ketones, POC UA: NEGATIVE
Leukocytes, UA: NEGATIVE
NITRITE UA: NEGATIVE
PH UA: 6
Protein Ur, POC: NEGATIVE
SPEC GRAV UA: 1.02
UROBILINOGEN UA: 0.2

## 2015-11-01 MED ORDER — FLUCONAZOLE 150 MG PO TABS: 150.0000 mg | ORAL_TABLET | ORAL | 0 refills | Status: DC

## 2015-11-01 NOTE — Progress Notes (Signed)
MRN: [id number] DOB: February 18, 1971  Subjective:   Isaac Hall is a 45 y.o. male presenting for chief complaint of Rash (groin area) and Dysuria  Reports 2 day history of rash over buttock associated with itching. He was taking ciprofloxacin to cover for urinary tract infection as diagnosed at his last visit on 10/23/2015. He has stopped taking ciprofloxacin. Reports that his dysuria has resolved. Has tried using a hemorrhoidal cream with minimal relief over his buttock area. The rash is very itchy and stings at times. Denies fever, n/v, abdominal pain, hematuria, penile discharge, diarrhea, bloody stools.  Shameek has a current medication list which includes the following prescription(s): blood glucose meter kit and supplies, citalopram, glucose blood, lisinopril, metformin, and ciprofloxacin. Also has No Known Allergies.  Weslee  has no past medical history on file. Also  has no past surgical history on file.  Objective:   Vitals: Temp 98.5 F (36.9 C)   Resp 16   Ht 5' 4" (1.626 m)   Wt 186 lb (84.4 kg)   SpO2 99%   BMI 31.93 kg/m   Physical Exam  Constitutional: He is oriented to person, place, and time. He appears well-developed and well-nourished.  Cardiovascular: Normal rate.   Pulmonary/Chest: Effort normal.  Genitourinary:     Neurological: He is alert and oriented to person, place, and time.    Results for orders placed or performed in visit on 11/01/15 (from the past 24 hour(s))  POCT Microscopic Urinalysis ([facility])     Status: Abnormal   Collection Time: 11/01/15  6:30 PM  Result Value Ref Range   WBC,UR,HPF,POC None None WBC/hpf   RBC,UR,HPF,POC Few (A) None RBC/hpf   Bacteria None None, Too numerous to count   Mucus Absent Absent   Epithelial Cells, UR Per Microscopy Few (A) None, Too numerous to count cells/hpf  POCT urinalysis dipstick     Status: Abnormal   Collection Time: 11/01/15  6:30 PM  Result Value Ref Range   Color, UA yellow yellow   Clarity, UA clear clear   Glucose, UA negative negative   Bilirubin, UA negative negative   Ketones, POC UA negative negative   Spec Grav, UA 1.020    Blood, UA small (A) negative   pH, UA 6.0    Protein Ur, POC negative negative   Urobilinogen, UA 0.2    Nitrite, UA Negative Negative   Leukocytes, UA Negative Negative   Assessment and Plan :   1. Rash of genital area 2. Cystitis 3. Dysuria - Cystitis is resolved, start diflucan to cover for antibiotic associated yeast infection, d/c ciprofloxacin. RTC in 1 week if no improvement.  [name], PA-C [facility] [phone] 11/01/2015 6:26 PM

## 2015-11-01 NOTE — Patient Instructions (Addendum)
Candida Infection, Adult A Candida infection (also called yeast, fungus, and Monilia infection) is an overgrowth of yeast that can occur anywhere on the body. A yeast infection commonly occurs in warm, moist body areas. Usually, the infection remains localized but can spread to become a systemic infection. A yeast infection may be a sign of a more severe disease such as diabetes, leukemia, or AIDS. A yeast infection can occur in both men and women. In women, Candida vaginitis is a vaginal infection. It is one of the most common causes of vaginitis. Men usually do not have symptoms or know they have an infection until other problems develop. Men may find out they have a yeast infection because their sex partner has a yeast infection. Uncircumcised men are more likely to get a yeast infection than circumcised men. This is because the uncircumcised glans is not exposed to air and does not remain as dry as that of a circumcised glans. Older adults may develop yeast infections around dentures. CAUSES  Women  Antibiotics.  Steroid medication taken for a long time.  Being overweight (obese).  Diabetes.  Poor immune condition.  Certain serious medical conditions.  Immune suppressive medications for organ transplant patients.  Chemotherapy.  Pregnancy.  Menstruation.  Stress and fatigue.  Intravenous drug use.  Oral contraceptives.  Wearing tight-fitting clothes in the crotch area.  Catching it from a sex partner who has a yeast infection.  Spermicide.  Intravenous, urinary, or other catheters. Men  Catching it from a sex partner who has a yeast infection.  Having oral or anal sex with a person who has the infection.  Spermicide.  Diabetes.  Antibiotics.  Poor immune system.  Medications that suppress the immune system.  Intravenous drug use.  Intravenous, urinary, or other catheters. SYMPTOMS  Women  Thick, white vaginal discharge.  Vaginal itching.  Redness  and swelling in and around the vagina.  Irritation of the lips of the vagina and perineum.  Blisters on the vaginal lips and perineum.  Painful sexual intercourse.  Low blood sugar (hypoglycemia).  Painful urination.  Bladder infections.  Intestinal problems such as constipation, indigestion, bad breath, bloating, increase in gas, diarrhea, or loose stools. Men  Men may develop intestinal problems such as constipation, indigestion, bad breath, bloating, increase in gas, diarrhea, or loose stools.  Dry, cracked skin on the penis with itching or discomfort.  Jock itch.  Dry, flaky skin.  Athlete's foot.  Hypoglycemia. DIAGNOSIS  Women  A history and an exam are performed.  The discharge may be examined under a microscope.  A culture may be taken of the discharge. Men  A history and an exam are performed.  Any discharge from the penis or areas of cracked skin will be looked at under the microscope and cultured.  Stool samples may be cultured. TREATMENT  Women  Vaginal antifungal suppositories and creams.  Medicated creams to decrease irritation and itching on the outside of the vagina.  Warm compresses to the perineal area to decrease swelling and discomfort.  Oral antifungal medications.  Medicated vaginal suppositories or cream for repeated or recurrent infections.  Wash and dry the irritation areas before applying the cream.  Eating yogurt with Lactobacillus may help with prevention and treatment.  Sometimes painting the vagina with gentian violet solution may help if creams and suppositories do not work. Men  Antifungal creams and oral antifungal medications.  Sometimes treatment must continue for 30 days after the symptoms go away to prevent recurrence. HOME CARE   INSTRUCTIONS  Women  Use cotton underwear and avoid tight-fitting clothing.  Avoid colored, scented toilet paper and deodorant tampons or pads.  Do not douche.  Keep your diabetes  under control.  Finish all the prescribed medications.  Keep your skin clean and dry.  Consume milk or yogurt with Lactobacillus-active culture regularly. If you get frequent yeast infections and think that is what the infection is, there are over-the-counter medications that you can get. If the infection does not show healing in 3 days, talk to your caregiver.  Tell your sex partner you have a yeast infection. Your partner may need treatment also, especially if your infection does not clear up or recurs. Men  Keep your skin clean and dry.  Keep your diabetes under control.  Finish all prescribed medications.  Tell your sex partner that you have a yeast infection so he or she can be treated if necessary. SEEK MEDICAL CARE IF:   Your symptoms do not clear up or worsen in one week after treatment.  You have an oral temperature above 102 F (38.9 C).  You have trouble swallowing or eating for a prolonged time.  You develop blisters on and around your vagina.  You develop vaginal bleeding and it is not your menstrual period.  You develop abdominal pain.  You develop intestinal problems as mentioned above.  You get weak or light-headed.  You have painful or increased urination.  You have pain during sexual intercourse. MAKE SURE YOU:   Understand these instructions.  Will watch your condition.  Will get help right away if you are not doing well or get worse.   This information is not intended to replace advice given to you by your health care provider. Make sure you discuss any questions you have with your health care provider.   Document Released: 04/11/2004 Document Revised: 03/25/2014 Document Reviewed: 09/05/2014 Elsevier Interactive Patient Education 2016 [employer]     IF you received an x-ray today, you will receive an invoice from [facility]. Please contact [facility] at [phone] with questions or concerns regarding your invoice.    IF you received labwork today, you will receive an invoice from [facility]. Please contact [facility] at [phone] with questions or concerns regarding your invoice.   Our billing staff will not be able to assist you with questions regarding bills from these companies.  You will be contacted with the lab results as soon as they are available. The fastest way to get your results is to activate your My Chart account. Instructions are located on the last page of this paperwork. If you have not heard from us regarding the results in 2 weeks, please contact this office.

## 2015-11-16 ENCOUNTER — Other Ambulatory Visit: Payer: Self-pay | Admitting: Physician Assistant

## 2015-11-16 DIAGNOSIS — F418 Other specified anxiety disorders: Secondary | ICD-10-CM

## 2015-12-11 ENCOUNTER — Ambulatory Visit (INDEPENDENT_AMBULATORY_CARE_PROVIDER_SITE_OTHER): Payer: Self-pay | Admitting: Family Medicine

## 2015-12-11 VITALS — BP 122/84 | HR 77 | Temp 97.6°F | Resp 20 | Wt 191.2 lb

## 2015-12-11 DIAGNOSIS — H00015 Hordeolum externum left lower eyelid: Secondary | ICD-10-CM

## 2015-12-11 MED ORDER — ERYTHROMYCIN 5 MG/GM OP OINT: 1.0000 "application " | TOPICAL_OINTMENT | Freq: Every day | OPHTHALMIC | 0 refills | Status: DC

## 2015-12-11 NOTE — Progress Notes (Signed)
   Subjective:    Patient ID: Isaac Hall, male    DOB: 10-20-1970, [age] y.o.   MRN: [id number]  HPI Pt presents with swolling itchy tender  area lt lower eye lid x 5 days/no tx    Review of Systems  Constitutional: Negative.   HENT: Negative.   Eyes: Positive for redness and itching.  Respiratory: Negative.   Cardiovascular: Negative.   Skin: Negative.   Psychiatric/Behavioral: Negative.        Objective:   Physical Exam  Constitutional: He appears well-developed and well-nourished.  HENT:  Head: Normocephalic and atraumatic.  Eyes: Conjunctivae and EOM are normal. Pupils are equal, round, and reactive to light.  Swelling lt lower lid/pruritic/no drainage/discomfort  Cardiovascular: Normal rate, regular rhythm and normal heart sounds.   Pulmonary/Chest: Effort normal and breath sounds normal.  Skin: Skin is warm.  Psychiatric: He has a normal mood and affect. His behavior is normal. Judgment and thought content normal.          Assessment & Plan:  Warm compresses see educational handout

## 2016-02-08 ENCOUNTER — Other Ambulatory Visit: Payer: Self-pay | Admitting: Physician Assistant

## 2016-02-08 DIAGNOSIS — F418 Other specified anxiety disorders: Secondary | ICD-10-CM

## 2016-02-16 ENCOUNTER — Ambulatory Visit (INDEPENDENT_AMBULATORY_CARE_PROVIDER_SITE_OTHER): Payer: BLUE CROSS/BLUE SHIELD | Admitting: Physician Assistant

## 2016-02-16 ENCOUNTER — Ambulatory Visit (HOSPITAL_COMMUNITY)
Admission: RE | Admit: 2016-02-16 | Discharge: 2016-02-16 | Disposition: A | Discharge status: Home / Self Care | Payer: BLUE CROSS/BLUE SHIELD | Source: Ambulatory Visit | Attending: Physician Assistant | Admitting: Physician Assistant

## 2016-02-16 ENCOUNTER — Ambulatory Visit (HOSPITAL_BASED_OUTPATIENT_CLINIC_OR_DEPARTMENT_OTHER)
Admission: RE | Admit: 2016-02-16 | Discharge: 2016-02-16 | Disposition: A | Discharge status: Home / Self Care | Payer: BLUE CROSS/BLUE SHIELD | Source: Ambulatory Visit | Attending: Physician Assistant | Admitting: Physician Assistant

## 2016-02-16 ENCOUNTER — Encounter (HOSPITAL_COMMUNITY): Payer: Self-pay

## 2016-02-16 ENCOUNTER — Ambulatory Visit (HOSPITAL_COMMUNITY): Admission: EM | Admit: 2016-02-16 | Payer: BLUE CROSS/BLUE SHIELD | Source: Other Acute Inpatient Hospital

## 2016-02-16 ENCOUNTER — Ambulatory Visit (HOSPITAL_BASED_OUTPATIENT_CLINIC_OR_DEPARTMENT_OTHER): Admission: RE | Admit: 2016-02-16 | Payer: BLUE CROSS/BLUE SHIELD | Source: Ambulatory Visit

## 2016-02-16 VITALS — BP 124/80 | HR 74

## 2016-02-16 DIAGNOSIS — M950 Acquired deformity of nose: Secondary | ICD-10-CM | POA: Diagnosis not present

## 2016-02-16 DIAGNOSIS — R42 Dizziness and giddiness: Secondary | ICD-10-CM

## 2016-02-16 DIAGNOSIS — R51 Headache: Secondary | ICD-10-CM | POA: Insufficient documentation

## 2016-02-16 DIAGNOSIS — R519 Headache, unspecified: Secondary | ICD-10-CM

## 2016-02-16 DIAGNOSIS — R110 Nausea: Secondary | ICD-10-CM

## 2016-02-16 MED ORDER — ONDANSETRON 4 MG PO TBDP: 4.0000 mg | ORAL_TABLET | Freq: Three times a day (TID) | ORAL | 0 refills | Status: DC | PRN

## 2016-02-16 NOTE — Patient Instructions (Addendum)
GO TO [facility]  NOW AND REGISTER AS AN OUTPATIENT [address], [place], [place] [id number]

## 2016-02-16 NOTE — Progress Notes (Signed)
Isaac Hall  MRN: [id number] DOB: 03-27-1970  Subjective:  Pt presents to clinic with left sided headache and some dizziness that started lat night and has not improved today.  He was able to work today even with his symptoms.  He has problems when he bends over the room feels like it is spinning - headache on the left frontal area (8/10) currently the pain (5/10) - the pain is sharp or throbbing - and will sometimes get better but has not resolved since it started - he took motrin last night and that helped with the pain but he has not taken more since his 1st dose. No neck pain but some lower back pain but that is not new for him No vision change Some nausea mainly with the vertigo  Glucose at home 130s -  Bp at home 138/97 today - he took his BP because he had a headache and did not feel good - he came here because he wonders if his headache is from his BP - not typical for his to check his BP - stopped the lisinopril  Family history - HTN but no migraines  Stye left eye last week but better now He does not have problems with sinus problems  Review of Systems  HENT: Negative.   Eyes: Negative.   Respiratory: Negative for chest tightness and shortness of breath.   Gastrointestinal: Positive for nausea. Negative for constipation, diarrhea and vomiting.  Neurological: Positive for dizziness and headaches.    Patient Active Problem List   Diagnosis Date Noted  . Uncontrolled type 2 diabetes mellitus without complication, without long-term current use of insulin ([place]) 07/04/2014    Current Outpatient Prescriptions on File Prior to Visit  Medication Sig Dispense Refill  . blood glucose meter kit and supplies Dispense based on patient and insurance preference. Use up to four times daily as directed. (FOR ICD-9 250.00, 250.01). 1 each 0  . citalopram (CELEXA) 20 MG tablet TAKE 1 TABLET BY MOUTH ONCE DAILY 30 tablet 0  . erythromycin (ROMYCIN) ophthalmic ointment Place 1  application into the left eye at bedtime. 3.5 g 0  . glucose blood test strip Check fasting blood sugars every morning. 100 each 12  . lisinopril (PRINIVIL,ZESTRIL) 5 MG tablet Take 1 tablet (5 mg total) by mouth daily. 90 tablet 3  . metFORMIN (GLUCOPHAGE) 500 MG tablet Take 1 tablet (500 mg total) by mouth 2 (two) times daily with a meal. 180 tablet 3   No current facility-administered medications on file prior to visit.     No Known Allergies  Pt patients past, family and social history were reviewed and updated.   Objective:  BP 124/80 (BP Location: Left Arm, Cuff Size: Normal)   Pulse 74   Physical Exam  Constitutional: He is oriented to person, place, and time and well-developed, well-nourished, and in no distress.  HENT:  Head: Normocephalic and atraumatic.  Right Ear: Hearing, tympanic membrane, external ear and ear canal normal.  Left Ear: Hearing, tympanic membrane, external ear and ear canal normal.  Nose: Nose normal.  Mouth/Throat: Uvula is midline, oropharynx is clear and moist and mucous membranes are normal.  Eyes: Conjunctivae, EOM and lids are normal. Pupils are equal, round, and reactive to light. Right eye exhibits no nystagmus. Left eye exhibits no nystagmus.  Neck: Normal range of motion.  Cardiovascular: Normal rate, regular rhythm and normal heart sounds.   No murmur heard. Pulmonary/Chest: Effort normal and breath sounds normal. He has no  wheezes.  Musculoskeletal:       Cervical back: Normal.  Lymphadenopathy:       Head (right side): No tonsillar adenopathy present.       Head (left side): No tonsillar adenopathy present.    He has no cervical adenopathy.       Right: No supraclavicular adenopathy present.       Left: No supraclavicular adenopathy present.  Neurological: He is alert and oriented to person, place, and time. He has normal sensation, normal strength, normal reflexes and intact cranial nerves. He displays no weakness, facial symmetry,  normal speech and normal reflexes. No cranial nerve deficit. He has a normal Straight Leg Raise Test and a normal Cerebellar Exam. Gait normal. Gait normal.  Skin: Skin is warm and dry.  Psychiatric: Mood, memory, affect and judgment normal.    Assessment and Plan :  Acute nonintractable headache, unspecified headache type - Plan: CT Head Wo Contrast, CT Maxillofacial WO CM  Nausea without vomiting - Plan: ondansetron (ZOFRAN ODT) 4 MG disintegrating tablet  Vertigo - Plan: CT Head Wo Contrast, meclizine (ANTIVERT) 25 MG tablet   Headaches not normal for patient and along with dizziness - ? Sinus problems vs migraines vs other - CT scan tonight and we will treat based on those results - if they are clear pt can use motrin for pain and antivert and then if the pain gets worse or does not resolve he will contact me and we will call in something stronger - if the dizziness does not improve he will need an OV to recheck in 4-5 days -   Windell Hummingbird PA-C  [facility] 02/21/2016 10:49 AM   02/16/2016 at 10:45pm - called and spoke with wife regarding normal CT scan - use medication as Rx for nausea and dizziness - they will f/u if no improvement

## 2016-02-17 MED ORDER — MECLIZINE HCL 25 MG PO TABS: 25.0000 mg | ORAL_TABLET | Freq: Three times a day (TID) | ORAL | 0 refills | Status: DC | PRN

## 2016-02-21 ENCOUNTER — Encounter: Payer: Self-pay | Admitting: Physician Assistant

## 2016-03-28 ENCOUNTER — Other Ambulatory Visit: Payer: Self-pay | Admitting: Physician Assistant

## 2016-03-28 DIAGNOSIS — F418 Other specified anxiety disorders: Secondary | ICD-10-CM

## 2016-04-24 ENCOUNTER — Ambulatory Visit (INDEPENDENT_AMBULATORY_CARE_PROVIDER_SITE_OTHER): Payer: BLUE CROSS/BLUE SHIELD

## 2016-04-24 ENCOUNTER — Ambulatory Visit (INDEPENDENT_AMBULATORY_CARE_PROVIDER_SITE_OTHER): Payer: BLUE CROSS/BLUE SHIELD | Admitting: Urgent Care

## 2016-04-24 VITALS — BP 118/80 | HR 68 | Temp 98.6°F | Resp 17 | Ht 64.0 in | Wt 188.0 lb

## 2016-04-24 DIAGNOSIS — M7989 Other specified soft tissue disorders: Secondary | ICD-10-CM

## 2016-04-24 DIAGNOSIS — M79644 Pain in right finger(s): Secondary | ICD-10-CM | POA: Diagnosis not present

## 2016-04-24 DIAGNOSIS — Z23 Encounter for immunization: Secondary | ICD-10-CM

## 2016-04-24 DIAGNOSIS — R200 Anesthesia of skin: Secondary | ICD-10-CM

## 2016-04-24 DIAGNOSIS — F418 Other specified anxiety disorders: Secondary | ICD-10-CM

## 2016-04-24 DIAGNOSIS — Z87828 Personal history of other (healed) physical injury and trauma: Secondary | ICD-10-CM | POA: Diagnosis not present

## 2016-04-24 MED ORDER — CITALOPRAM HYDROBROMIDE 20 MG PO TABS: 20.0000 mg | ORAL_TABLET | Freq: Every day | ORAL | 0 refills | Status: DC

## 2016-04-24 MED ORDER — NAPROXEN SODIUM 550 MG PO TABS: 550.0000 mg | ORAL_TABLET | Freq: Two times a day (BID) | ORAL | 1 refills | Status: DC

## 2016-04-24 NOTE — Progress Notes (Addendum)
  MRN: [id number] DOB: Jul 17, 1970  Subjective:   Isaac Hall is a 46 y.o. male presenting for chief complaint of Hand Pain (Rt pinky finger/ onset 1 week)  Reports 1 week history of right 5th finger pain. Of note, patient reports having suffered a bad laceration which was repaired. Thereafter, reports that Isaac Hall had a white mass that Isaac Hall removed 1 month after Isaac Hall injury. Since then Isaac Hall continues to have occasional discharge like this, worst in the past week. Reports intermittent sharp pain, swelling, tingling. Denies fever, redness.   Isaac Hall has a current medication list which includes the following prescription(s): blood glucose meter kit and supplies, citalopram, erythromycin, glucose blood, lisinopril, meclizine, metformin, and ondansetron. Also has No Known Allergies.  Isaac Hall  has a past medical history of Anxiety and Diabetes mellitus without complication ([place]). Denies past surgical history.   Objective:   Vitals: BP 118/80 (BP Location: Right Arm, Patient Position: Sitting, Cuff Size: Normal)   Pulse 68   Temp 98.6 F (37 C) (Oral)   Resp 17   Ht _0  (1.626 m)   Wt 188 lb (85.3 kg)   SpO2 98%   BMI 32.27 kg/m   Physical Exam  Constitutional: Isaac Hall is oriented to person, place, and time. Isaac Hall appears well-developed and well-nourished.  Cardiovascular: Normal rate.   Pulmonary/Chest: Effort normal.  Musculoskeletal:       Right hand: Isaac Hall exhibits tenderness (DIP of palmar surface of 5th finger) and swelling (from MCP to DIP). Isaac Hall exhibits normal range of motion, no bony tenderness, normal two-point discrimination, normal capillary refill, no deformity and no laceration. Normal sensation noted. Normal strength noted.       Hands: Neurological: Isaac Hall is alert and oriented to person, place, and time.  Skin: Skin is warm and dry. Capillary refill takes less than 2 seconds. No rash noted. No erythema.  Psychiatric: Isaac Hall has a normal mood and affect.   Dg Finger Little Right  Result  Date: 04/24/2016 CLINICAL DATA:  Pain and swelling of the right fifth finger. EXAM: RIGHT LITTLE FINGER 2+V COMPARISON:  None. FINDINGS: The joint spaces are maintained. No acute bony findings or destructive bony changes. No definite plain film findings for septic arthritis. Mild diffuse soft tissue swelling. IMPRESSION: No acute bony findings. Electronically Signed   By: Marijo Sanes M.D.   On: 04/24/2016 17:11   Assessment and Plan :   This case was precepted with Dr. Mitchel Honour.   1. Pain of finger of right hand 2. Finger swelling 3. History of laceration of skin 4.  Finger numbness - Unclear etiology, will refer to hand specialist. There is no sign of radiopaque foreign body. Will have patient start Anaprox twice daily for pain and inflammation. Ambulatory referral to Hand Surgery pending.  5. Need for prophylactic vaccination and inoculation against influenza - Flu Vaccine QUAD 36+ mos IM  [name], PA-C [facility] [place] [phone] 04/24/2016  4:32 PM   UPDATE: At the end of Isaac Hall visit patient requested a refill of Celexa. Patient states that Isaac Hall is doing okay with this medication. However, I advised that Isaac Hall set up a follow up appointment so that we can appropriately address Isaac Hall refill. I gave him a 90 day supply in the meantime.

## 2016-04-24 NOTE — Patient Instructions (Addendum)
[facility] dos veces diaramente para dolor y inflammacion.     IF you received an x-ray today, you will receive an invoice from [facility]. Please contact [facility] at [phone] with questions or concerns regarding your invoice.   IF you received labwork today, you will receive an invoice from [facility]. Please contact [facility] at [phone] with questions or concerns regarding your invoice.   Our billing staff will not be able to assist you with questions regarding bills from these companies.  You will be contacted with the lab results as soon as they are available. The fastest way to get your results is to activate your My Chart account. Instructions are located on the last page of this paperwork. If you have not heard from us regarding the results in 2 weeks, please contact this office.

## 2016-04-24 NOTE — Addendum Note (Signed)
Addended by: Wallis BambergMANI,  on: 04/24/2016 05:36 PM   Modules accepted: Orders, Level of Service

## 2016-04-25 ENCOUNTER — Ambulatory Visit: Payer: BLUE CROSS/BLUE SHIELD | Admitting: Urgent Care

## 2016-04-25 LAB — CBC
Hematocrit: 44.2 % (ref 37.5–51.0)
Hemoglobin: 15 g/dL (ref 13.0–17.7)
MCH: 29.1 pg (ref 26.6–33.0)
MCHC: 33.9 g/dL (ref 31.5–35.7)
MCV: 86 fL (ref 79–97)
PLATELETS: 194 10*3/uL (ref 150–379)
RBC: 5.15 x10E6/uL (ref 4.14–5.80)
RDW: 13.7 % (ref 12.3–15.4)
WBC: 8 10*3/uL (ref 3.4–10.8)

## 2016-05-01 ENCOUNTER — Ambulatory Visit: Payer: BLUE CROSS/BLUE SHIELD

## 2016-05-06 ENCOUNTER — Ambulatory Visit (INDEPENDENT_AMBULATORY_CARE_PROVIDER_SITE_OTHER): Payer: BLUE CROSS/BLUE SHIELD | Admitting: Urgent Care

## 2016-05-06 VITALS — BP 118/82 | HR 84 | Temp 98.5°F | Resp 16 | Ht 64.0 in | Wt 194.4 lb

## 2016-05-06 DIAGNOSIS — E669 Obesity, unspecified: Secondary | ICD-10-CM

## 2016-05-06 DIAGNOSIS — F418 Other specified anxiety disorders: Secondary | ICD-10-CM | POA: Diagnosis not present

## 2016-05-06 DIAGNOSIS — E786 Lipoprotein deficiency: Secondary | ICD-10-CM | POA: Diagnosis not present

## 2016-05-06 DIAGNOSIS — E119 Type 2 diabetes mellitus without complications: Secondary | ICD-10-CM | POA: Diagnosis not present

## 2016-05-06 DIAGNOSIS — Z23 Encounter for immunization: Secondary | ICD-10-CM

## 2016-05-06 MED ORDER — LISINOPRIL 5 MG PO TABS: 5.0000 mg | ORAL_TABLET | Freq: Every day | ORAL | 3 refills | Status: DC

## 2016-05-06 MED ORDER — METFORMIN HCL 500 MG PO TABS: 500.0000 mg | ORAL_TABLET | Freq: Two times a day (BID) | ORAL | 3 refills | Status: DC

## 2016-05-06 MED ORDER — ATORVASTATIN CALCIUM 10 MG PO TABS: 10.0000 mg | ORAL_TABLET | Freq: Every day | ORAL | 3 refills | Status: DC

## 2016-05-06 MED ORDER — CITALOPRAM HYDROBROMIDE 20 MG PO TABS: 20.0000 mg | ORAL_TABLET | Freq: Every day | ORAL | 3 refills | Status: DC

## 2016-05-06 NOTE — Progress Notes (Signed)
   MRN: [id number]  Subjective:   Isaac Charlett NoseMiranda-Perez is a 46 y.o. male who presents for follow up of Type 2 Diabetes Mellitus.   Diagnosis was made 2016. Patient is currently managed with Metformin 500mg  BID. Denies adverse effects including metallic taste, hypoglycemia, nausea, vomiting.  Patient is checking home blood sugars. Home blood sugar is generally 130's. Patient denies blurred vision, polydipsia, chest pain, nausea, vomiting, abdominal pain, hematuria, polyuria, skin infections, numbness or tingling. Patient is not checking their feet daily. Denies foot concerns. Last diabetic eye exam eye exam was 2017, plans on rescheduling this year. Diet is generally healthy. Patient is not exercising but stays very active with work. Has occasional alcohol drink. Denies smoking cigarettes.  Known diabetic complications: none  Immunizations: Flu vaccine 04/24/2016, tdap 03/18/2013, pneumococal vaccine will be updated today.  Objective:   PHYSICAL EXAM BP 118/82 (BP Location: Right Arm, Cuff Size: Normal)   Pulse 84   Temp 98.5 F (36.9 C) (Oral)   Resp 16   Ht 5\' 4"  (1.626 m)   Wt 194 lb 6.4 oz (88.2 kg)   SpO2 98%   BMI 33.37 kg/m   Physical Exam  Constitutional: He is oriented to person, place, and time. He appears well-developed and well-nourished.  HENT:  Mouth/Throat: Oropharynx is clear and moist.  Eyes: No scleral icterus.  Cardiovascular: Normal rate, regular rhythm and intact distal pulses.  Exam reveals no gallop and no friction rub.   No murmur heard. Pulmonary/Chest: No respiratory distress. He has no wheezes. He has no rales.  Abdominal: Soft. Bowel sounds are normal. He exhibits no distension and no mass. There is no tenderness. There is no guarding.  Neurological: He is alert and oriented to person, place, and time.  Skin: Skin is warm and dry.   Diabetic Foot Exam - Simple   Simple Foot Form Visual Inspection No deformities, no ulcerations, no other skin  breakdown bilaterally:  Yes Sensation Testing Intact to touch and monofilament testing bilaterally:  Yes Pulse Check Comments Pedal pulses felt in the left foot, could not feel in the right foot    Assessment and Plan :   1. Controlled type 2 diabetes mellitus without complication, without long-term current use of insulin ([facility]) 2. Obesity, unspecified classification, unspecified obesity type, unspecified whether serious comorbidity present - Continue current regimen, recheck in 6 months. Continue healthy lifestyle, diet.  3. Low HDL (under 40) - Refilled atorvastatin 10mg .  4. Depression with anxiety - Refilled Celexa, stable.  5. Need for prophylactic vaccination against Streptococcus pneumoniae (pneumococcus) - Pneumococcal polysaccharide vaccine 23-valent greater than or equal to 2yo subcutaneous/IM   Wallis BambergMario , PA-C [facility] at [facility] [phone] 05/06/2016 5:09 PM

## 2016-05-06 NOTE — Patient Instructions (Addendum)
La diabetes mellitus y los alimentos (Diabetes Mellitus and Food) Es importante que controle su nivel de azcar en la sangre (glucosa). El nivel de glucosa en sangre depende en gran medida de lo que usted come. Comer alimentos saludables en las cantidades adecuadas a lo largo del da, aproximadamente a la misma hora todos los das, lo ayudar a controlar su nivel de glucosa en sangre. Tambin puede ayudarlo a retrasar o evitar el empeoramiento de la diabetes mellitus. Comer de manera saludable incluso puede ayudarlo a mejorar el nivel de presin arterial y a alcanzar o mantener un peso saludable. Entre las recomendaciones generales para alimentarse y cocinar los alimentos de forma saludable, se incluyen las siguientes:  Respetar las comidas principales y comer colaciones con regularidad. Evitar pasar largos perodos sin comer con el fin de perder peso.  Seguir una dieta que consista principalmente en alimentos de origen vegetal, como frutas, vegetales, frutos secos, legumbres y cereales integrales.  Utilizar mtodos de coccin a baja temperatura, como hornear, en lugar de mtodos de coccin a alta temperatura, como frer en abundante aceite. Trabaje con el nutricionista para aprender a usar la informacin nutricional de las etiquetas de los alimentos. CMO PUEDEN AFECTARME LOS ALIMENTOS? Carbohidratos Los carbohidratos afectan el nivel de glucosa en sangre ms que cualquier otro tipo de alimento. El nutricionista lo ayudar a determinar cuntos carbohidratos puede consumir en cada comida y ensearle a contarlos. El recuento de carbohidratos es importante para mantener la glucosa en sangre en un nivel saludable, en especial si utiliza insulina o toma determinados medicamentos para la diabetes mellitus. Alcohol El alcohol puede provocar disminuciones sbitas de la glucosa en sangre (hipoglucemia), en especial si utiliza insulina o toma determinados medicamentos para la diabetes mellitus. La  hipoglucemia es una afeccin que puede poner en peligro la vida. Los sntomas de la hipoglucemia (somnolencia, mareos y desorientacin) son similares a los sntomas de haber consumido mucho alcohol. Si el mdico lo autoriza a beber alcohol, hgalo con moderacin y siga estas pautas:  Las mujeres no deben beber ms de un trago por da, y los hombres no deben beber ms de dos tragos por da. Un trago es igual a: ? 12 onzas (355 ml) de cerveza ? 5 onzas de vino (150 ml) de vino ? 1,5onzas (45ml) de bebidas espirituosas  No beba con el estmago vaco.  Mantngase hidratado. Beba agua, gaseosas dietticas o t helado sin azcar.  Las gaseosas comunes, los jugos y otros refrescos podran contener muchos carbohidratos y se deben contar. QU ALIMENTOS NO SE RECOMIENDAN? Cuando haga las elecciones de alimentos, es importante que recuerde que todos los alimentos son distintos. Algunos tienen menos nutrientes que otros por porcin, aunque podran tener la misma cantidad de caloras o carbohidratos. Es difcil darle al cuerpo lo que necesita cuando consume alimentos con menos nutrientes. Estos son algunos ejemplos de alimentos que debera evitar ya que contienen muchas caloras y carbohidratos, pero pocos nutrientes:  Grasas trans (la mayora de los alimentos procesados incluyen grasas trans en la etiqueta de Informacin nutricional).  Gaseosas comunes.  Jugos.  Caramelos.  Dulces, como tortas, pasteles, rosquillas y galletas.  Comidas fritas. QU ALIMENTOS PUEDO COMER? Consuma alimentos ricos en nutrientes, que nutrirn el cuerpo y lo mantendrn saludable. Los alimentos que debe comer tambin dependern de varios factores, como:  Las caloras que necesita.  Los medicamentos que toma.  Su peso.  El nivel de glucosa en sangre.  El nivel de presin arterial.  El nivel de colesterol. Debe consumir   una amplia variedad de alimentos, por ejemplo:  Protenas. ? Cortes de carne  magros. ? Protenas con bajo contenido de grasas saturadas, como pescado, clara de huevo y frijoles. Evite las carnes procesadas.  Frutas y vegetales. ? Frutas y vegetales que pueden ayudar a controlar los niveles sanguneos de glucosa, como manzanas, mangos y batatas.  Productos lcteos. ? Elija productos lcteos sin grasa o con bajo contenido de grasa, como leche, yogur y queso.  Cereales, panes, pastas y arroz. ? Elija cereales integrales, como panes multicereales, avena en grano y arroz integral. Estos alimentos pueden ayudar a controlar la presin arterial.  Grasas. ? Alimentos que contengan grasas saludables, como frutos secos, aguacate, aceite de oliva, aceite de canola y pescado. TODOS LOS QUE PADECEN DIABETES MELLITUS TIENEN EL MISMO PLAN DE COMIDAS? Dado que todas las personas que padecen diabetes mellitus son distintas, no hay un solo plan de comidas que funcione para todos. Es muy importante que se rena con un nutricionista que lo ayudar a crear un plan de comidas adecuado para usted. Esta informacin no tiene como fin reemplazar el consejo del mdico. Asegrese de hacerle al mdico cualquier pregunta que tenga. Document Released: 06/11/2007 Document Revised: 03/25/2014 Document Reviewed: 01/29/2013 Elsevier Interactive Patient Education  2017 Elsevier Inc.     IF you received an x-ray today, you will receive an invoice from [facility]. Please contact  Radiology at [phone] with questions or concerns regarding your invoice.   IF you received labwork today, you will receive an invoice from [facility]. Please contact [facility] at [phone] with questions or concerns regarding your invoice.   Our billing staff will not be able to assist you with questions regarding bills from these companies.  You will be contacted with the lab results as soon as they are available. The fastest way to get your results is to activate your My Chart account. Instructions  are located on the last page of this paperwork. If you have not heard from us regarding the results in 2 weeks, please contact this office.     

## 2016-05-07 LAB — COMPREHENSIVE METABOLIC PANEL
A/G RATIO: 1.6 (ref 1.2–2.2)
ALBUMIN: 4.6 g/dL (ref 3.5–5.5)
ALT: 35 IU/L (ref 0–44)
AST: 20 IU/L (ref 0–40)
Alkaline Phosphatase: 84 IU/L (ref 39–117)
BUN/Creatinine Ratio: 19 (ref 9–20)
BUN: 11 mg/dL (ref 6–24)
Bilirubin Total: <0.2 mg/dL (ref 0.0–1.2)
CALCIUM: 9.3 mg/dL (ref 8.7–10.2)
CO2: 23 mmol/L (ref 18–29)
Chloride: 97 mmol/L (ref 96–106)
Creatinine, Ser: 0.59 mg/dL — ABNORMAL LOW (ref 0.76–1.27)
GFR, EST AFRICAN AMERICAN: 141 mL/min/{1.73_m2} (ref >59)
GFR, EST NON AFRICAN AMERICAN: 122 mL/min/{1.73_m2} (ref >59)
GLOBULIN, TOTAL: 2.9 g/dL (ref 1.5–4.5)
Glucose: 200 mg/dL — ABNORMAL HIGH (ref 65–99)
POTASSIUM: 4.3 mmol/L (ref 3.5–5.2)
SODIUM: 136 mmol/L (ref 134–144)
TOTAL PROTEIN: 7.5 g/dL (ref 6.0–8.5)

## 2016-05-07 LAB — HEMOGLOBIN A1C
Est. average glucose Bld gHb Est-mCnc: 146 mg/dL
Hgb A1c MFr Bld: 6.7 % — ABNORMAL HIGH (ref 4.8–5.6)

## 2016-10-24 ENCOUNTER — Other Ambulatory Visit: Payer: Self-pay | Admitting: Urgent Care

## 2016-10-24 DIAGNOSIS — E1165 Type 2 diabetes mellitus with hyperglycemia: Principal | ICD-10-CM

## 2016-10-24 DIAGNOSIS — IMO0001 Reserved for inherently not codable concepts without codable children: Secondary | ICD-10-CM

## 2016-10-26 NOTE — Telephone Encounter (Signed)
Need to schedule office visit this month for follow up

## 2016-11-08 ENCOUNTER — Other Ambulatory Visit: Payer: Self-pay | Admitting: Urgent Care

## 2017-02-20 ENCOUNTER — Encounter: Payer: Self-pay | Admitting: Urgent Care

## 2017-02-20 ENCOUNTER — Ambulatory Visit (INDEPENDENT_AMBULATORY_CARE_PROVIDER_SITE_OTHER): Payer: BLUE CROSS/BLUE SHIELD | Admitting: Urgent Care

## 2017-02-20 ENCOUNTER — Other Ambulatory Visit: Payer: Self-pay

## 2017-02-20 VITALS — BP 120/90 | HR 73 | Temp 98.3°F | Resp 16 | Ht 64.0 in | Wt 189.8 lb

## 2017-02-20 DIAGNOSIS — Z114 Encounter for screening for human immunodeficiency virus [HIV]: Secondary | ICD-10-CM | POA: Diagnosis not present

## 2017-02-20 DIAGNOSIS — E119 Type 2 diabetes mellitus without complications: Secondary | ICD-10-CM

## 2017-02-20 DIAGNOSIS — Z23 Encounter for immunization: Secondary | ICD-10-CM

## 2017-02-20 DIAGNOSIS — F418 Other specified anxiety disorders: Secondary | ICD-10-CM

## 2017-02-20 DIAGNOSIS — H6123 Impacted cerumen, bilateral: Secondary | ICD-10-CM | POA: Diagnosis not present

## 2017-02-20 DIAGNOSIS — Z0000 Encounter for general adult medical examination without abnormal findings: Secondary | ICD-10-CM

## 2017-02-20 MED ORDER — METFORMIN HCL 500 MG PO TABS: 500.0000 mg | ORAL_TABLET | Freq: Two times a day (BID) | ORAL | 3 refills | Status: DC

## 2017-02-20 MED ORDER — LISINOPRIL 5 MG PO TABS: 5.0000 mg | ORAL_TABLET | Freq: Every day | ORAL | 3 refills | Status: DC

## 2017-02-20 MED ORDER — CITALOPRAM HYDROBROMIDE 20 MG PO TABS: 20.0000 mg | ORAL_TABLET | Freq: Every day | ORAL | 3 refills | Status: DC

## 2017-02-20 NOTE — Patient Instructions (Addendum)
Mantenimiento de la salud en los hombres (Health Maintenance, Male) Un estilo de vida saludable y los cuidados preventivos son importantes para la salud y el bienestar. Pregntele al mdico cul es el cronograma de exmenes peridicos adecuado para usted. QU DEBO SABER SOBRE EL PESO Y LA DIETA? Consuma una dieta saludable  Coma muchas verduras, frutas, cereales integrales, productos lcteos con bajo contenido de grasa y protenas magras.  No consuma muchos alimentos de alto contenido de grasas slidas, azcares agregados o sal. Mantenga un peso saludable La actividad fsica habitual puede ayudarlo a alcanzar o mantener un peso saludable. Deber hacer lo siguiente:  Realizar al menos 150minutos de actividad fsica por semana. El ejercicio debe aumentar la frecuencia cardaca y provocar la transpiracin (ejercicio de intensidad moderada).  Hacer ejercicios de entrenamiento de fuerza por lo menos dos veces por semana. Controlarse los niveles de colesterol y lpidos en la sangre  Hgase anlisis de sangre para controlar los lpidos y el colesterol cada 5aos a partir de los 35aos. Si tiene un riesgo alto de tener cardiopatas coronarias, debe comenzar a hacerse anlisis de sangre a los 20aos. Es posible que necesite controlar los niveles de colesterol con mayor frecuencia si: ? Sus niveles de lpidos y colesterol son altos. ? Es mayor de 50aos. ? Tiene un riesgo alto de tener cardiopatas coronarias. QU DEBO SABER SOBRE LAS PRUEBAS DE DETECCIN DEL CNCER? Muchos tipos de cncer se pueden detectar de manera temprana y a menudo prevenirse. Cncer de pulmn  Debe someterse a pruebas de deteccin de cncer de pulmn todos los aos en los siguientes casos: ? Si fuma actualmente y lo ha hecho durante por lo menos 30aos. ? Si fue fumador que dej el hbito en el trmino de los ltimos 15aos.  Hable con el mdico sobre las opciones en relacin con los estudios de deteccin, cundo  debe comenzar a hacrselos y con qu frecuencia. Cncer colorrectal  Generalmente, las pruebas de deteccin habituales del cncer colorrectal comienzan a los 50aos y deben repetirse cada 5 a 10aos hasta los 75aos. Es posible que tenga que hacerse las pruebas con mayor frecuencia si se detectan formas tempranas de plipos precancerosos o pequeos bultos. Sin embargo, el mdico podr aconsejarle que lo haga antes, si tiene factores de riesgo para el cncer de colon.  El mdico puede recomendarle que use kits de prueba caseros para hallar sangre oculta en la materia fecal.  Se puede usar una pequea cmara en el extremo de un tubo para examinar el colon (sigmoidoscopia o colonoscopia). Este estudio detecta las formas ms tempranas de cncer colorrectal. Cncer de prstata y de testculo  En funcin de la edad y del estado de salud general, el mdico puede realizarle determinados estudios de deteccin del cncer de prstata y de testculo.  Hable con el mdico sobre cualquier sntoma o acerca de las inquietudes que tenga sobre el cncer de prstata o de testculo. Cncer de piel  Revise la piel de la cabeza a los pies con regularidad.  Informe al mdico si aparecen nuevos lunares o si nota cambios en los que ya tiene, especialmente en estos casos: ? Si hay un cambio en el tamao, la forma o el color del lunar. ? Si tiene un lunar que es ms grande que el tamao de una goma de lpiz.  Siempre use pantalla solar. Aplquese pantalla solar de manera generosa y repetida a lo largo del da.  Use mangas y pantalones largos, un sombrero de ala ancha y gafas para   el sol cuando est al aire libre, para protegerse. QU DEBO SABER SOBRE LAS CARDIOPATAS CORONARIAS, LA DIABETES Y LA HIPERTENSIN ARTERIAL?  Si usted tiene entre 18 y 39aos, debe medirse la presin arterial cada 3a 5aos. Si usted tiene 40aos o ms, debe medirse la presin arterial todos los aos. Debe medirse la presin arterial  dos veces: una vez cuando est en un hospital o una clnica y la otra vez cuando est en otro sitio. Registre el promedio de las dos mediciones. Para controlar su presin arterial cuando no est en un hospital o una clnica, puede usar lo siguiente: ? Una mquina automtica para medir la presin arterial en una farmacia. ? Un monitor para medir la presin arterial en el hogar.  Hable con el mdico sobre los valores ideales de la presin arterial.  Si tiene entre 45 y 79aos, consltele al mdico si debe tomar aspirina para evitar las cardiopatas coronarias.  Hgase anlisis habituales de deteccin de la diabetes; para ello, contrlese la glucemia en ayunas. ? Si su peso es normal y tiene un bajo riesgo de padecer diabetes, realcese este anlisis cada tres aos despus de los 45aos. ? Si tiene sobrepeso y un alto riesgo de padecer diabetes, considere someterse a este anlisis antes o con mayor frecuencia.  Para los hombres que tienen entre 65 y 75aos, y son o han sido fumadores, se recomienda un nico estudio con ecografa para detectar un aneurisma de aorta abdominal (AAA). QU DEBO SABER SOBRE LA PREVENCIN DE LAS INFECCIONES? HepatitisB Si tiene un riesgo ms alto de contraer hepatitis B, debe someterse a un examen de deteccin de este virus. Hable con el mdico para determinar si corre riesgo de tener una infeccin por hepatitisB. Hepatitis C Se recomienda un anlisis de sangre para:  Todos los que nacieron entre 1945 y 1965.  Todas las personas que tengan un riesgo de haber contrado hepatitis C. Enfermedades de transmisin sexual (ETS)  Debe realizarse pruebas de deteccin de las ETS todos los aos, incluidas la gonorrea y la clamidia, en estos casos: ? Es sexualmente activo y es menor de 24aos. ? Es mayor de 24aos, y el mdico le informa que corre riesgo de tener este tipo de infecciones. ? La actividad sexual ha cambiado desde que le hicieron la ltima prueba de  deteccin y tiene un riesgo mayor de tener clamidia o gonorrea. Pregntele al mdico si usted tiene riesgo.  Consulte a su mdico para saber si tiene un alto riesgo de infectarse por el VIH. El mdico puede recomendarle un medicamento de venta con receta para ayudar a evitar la infeccin por el VIH. QU MS PUEDO HACER?  Realcese los estudios de rutina de la salud, dentales y de la vista.  Mantngase al da con las vacunas (inmunizaciones).  No consuma ningn producto que contenga tabaco, lo que incluye cigarrillos, tabaco de mascar y cigarrillos electrnicos. Si necesita ayuda para dejar de fumar, consulte al mdico.  Limite el consumo de alcohol a no ms de 2medidas por da. Una medida equivale a 12 onzas de cerveza, 5onzas de vino o 1onzas de bebidas alcohlicas de alta graduacin.  No consuma drogas.  No comparta agujas.  Solicite ayuda a su mdico si necesita apoyo o informacin para abandonar las drogas.  Informe a su mdico si a menudo se siente deprimido.  Notifique a su mdico si alguna vez ha sido vctima de abuso o si no se siente seguro en su hogar. Esta informacin no tiene como fin reemplazar el   consejo del mdico. Asegrese de hacerle al mdico cualquier pregunta que tenga. Document Released: 08/31/2007 Document Revised: 03/25/2014 Document Reviewed: 12/06/2014 Elsevier Interactive Patient Education  2018 [employer] Inc.     IF you received an x-ray today, you will receive an invoice from [facility]. Please contact [facility] at [phone] with questions or concerns regarding your invoice.   IF you received labwork today, you will receive an invoice from [facility]. Please contact [facility] at [phone] with questions or concerns regarding your invoice.   Our billing staff will not be able to assist you with questions regarding bills from these companies.  You will be contacted with the lab results as soon as they are available. The fastest  way to get your results is to activate your My Chart account. Instructions are located on the last page of this paperwork. If you have not heard from us regarding the results in 2 weeks, please contact this office.      

## 2017-02-20 NOTE — Progress Notes (Signed)
MRN: [id number]  Subjective:   Mr. Isaac Hall is a 46 y.o. male presenting for annual physical exam. Patient is working as a Glass blower/designer, is married, stays active. Has good relationships at home, has a good support network. Denies smoking cigarettes or drinking alcohol.   Medical care team includes: PCP: Mancel Bale, PA-C Vision: Needs an eye exam.  Dental: Cleanings once yearly and plans on setting up a visit soon. Specialists: None.  Isaac Hall has a current medication list which includes the following prescription(s): citalopram, lisinopril, metformin, naproxen sodium, accu-chek aviva plus, accu-chek aviva plus, atorvastatin, blood glucose meter kit and supplies, lisinopril, and meclizine. He has No Known Allergies. Isaac Hall  has a past medical history of Anxiety and Diabetes mellitus without complication ([place]). Has a history of vasectomy. His family history includes Cancer in his mother; Diabetes in his father, mother, sister, sister, sister, and sister; Hypertension in his sister.  Immunizations: Influenza updated today.  Review of Systems  Constitutional: Negative for chills, diaphoresis, fever, malaise/fatigue and weight loss.  HENT: Negative for congestion, ear discharge, ear pain, hearing loss, nosebleeds, sore throat and tinnitus.   Eyes: Negative for blurred vision, double vision, photophobia, pain, discharge and redness.  Respiratory: Negative for cough, shortness of breath and wheezing.   Cardiovascular: Negative for chest pain, palpitations and leg swelling.  Gastrointestinal: Negative for abdominal pain, blood in stool, constipation, diarrhea, nausea and vomiting.  Genitourinary: Negative for dysuria, flank pain, frequency, hematuria and urgency.  Musculoskeletal: Negative for back pain, joint pain and myalgias.  Skin: Negative for itching and rash.  Neurological: Negative for dizziness, tingling, seizures, loss of consciousness, weakness and headaches.    Endo/Heme/Allergies: Negative for polydipsia.  Psychiatric/Behavioral: Negative for depression, hallucinations, memory loss, substance abuse and suicidal ideas. The patient is not nervous/anxious and does not have insomnia.     Objective:   Vitals: BP 120/90 (BP Location: Left Arm, Patient Position: Sitting, Cuff Size: Large)   Pulse 73   Temp 98.3 F (36.8 C) (Oral)   Resp 16   Ht '5\' 4"'$  (1.626 m)   Wt 189 lb 12.8 oz (86.1 kg)   SpO2 95%   BMI 32.58 kg/m   Wt Readings from Last 3 Encounters:  02/20/17 189 lb 12.8 oz (86.1 kg)  05/06/16 194 lb 6.4 oz (88.2 kg)  04/24/16 188 lb (85.3 kg)   Physical Exam  Constitutional: He is oriented to person, place, and time. He appears well-developed and well-nourished.  HENT:  TM's cerumen impacted bilaterally, no effusions or erythema. Nasal turbinates pink and moist, nasal passages patent. No sinus tenderness. Oropharynx clear, mucous membranes moist, dentition in good repair.  Eyes: Conjunctivae and EOM are normal. Pupils are equal, round, and reactive to light. Right eye exhibits no discharge. Left eye exhibits no discharge. No scleral icterus.  Neck: Normal range of motion. Neck supple. No thyromegaly present.  Cardiovascular: Normal rate, regular rhythm and intact distal pulses. Exam reveals no gallop and no friction rub.  No murmur heard. Pulmonary/Chest: No stridor. No respiratory distress. He has no wheezes. He has no rales.  Abdominal: Soft. Bowel sounds are normal. He exhibits no distension and no mass. There is no tenderness.  Musculoskeletal: Normal range of motion. He exhibits no edema or tenderness.  Lymphadenopathy:    He has no cervical adenopathy.  Neurological: He is alert and oriented to person, place, and time. He has normal reflexes. He displays normal reflexes. Coordination normal.  Skin: Skin is warm and dry. No  rash noted. No erythema. No pallor.  Psychiatric: He has a normal mood and affect.   Assessment and Plan  :   1. Annual physical exam - Labs pending, patient is medically stable. Discussed healthy lifestyle, diet, exercise, preventative care, vaccinations, and addressed patient's concerns.  - Comprehensive metabolic panel - Lipid panel - TSH - CBC  2. Need for prophylactic vaccination and inoculation against influenza - Flu Vaccine QUAD 36+ mos IM  3. Controlled type 2 diabetes mellitus without complication, without long-term current use of insulin (HCC) - Controlled, maintain healthy lifestyle. Patient will set up appt for his diabetic eye exam. Admits that he missed his last appointment but will try to keep the next one.  - Hemoglobin A1c - lisinopril (PRINIVIL,ZESTRIL) 5 MG tablet; Take 1 tablet (5 mg total) by mouth daily.  Dispense: 90 tablet; Refill: 3  4. Depression with anxiety - Stable, refilled medication. - citalopram (CELEXA) 20 MG tablet; Take 1 tablet (20 mg total) by mouth daily.  Dispense: 90 tablet; Refill: 3  5. Screening for HIV (human immunodeficiency virus) - HIV antibody   Follow up in 6 months unless labs dictate otherwise.  [name], PA-C [facility] [phone] 02/20/2017  9:00 AM

## 2017-02-21 LAB — COMPREHENSIVE METABOLIC PANEL
A/G RATIO: 1.6 (ref 1.2–2.2)
ALT: 46 IU/L — AB (ref 0–44)
AST: 33 IU/L (ref 0–40)
Albumin: 4.5 g/dL (ref 3.5–5.5)
Alkaline Phosphatase: 78 IU/L (ref 39–117)
BUN/Creatinine Ratio: 13 (ref 9–20)
BUN: 10 mg/dL (ref 6–24)
Bilirubin Total: 0.3 mg/dL (ref 0.0–1.2)
CALCIUM: 9.4 mg/dL (ref 8.7–10.2)
CHLORIDE: 103 mmol/L (ref 96–106)
CO2: 23 mmol/L (ref 20–29)
Creatinine, Ser: 0.8 mg/dL (ref 0.76–1.27)
GFR calc Af Amer: 124 mL/min/{1.73_m2} (ref >59)
GFR, EST NON AFRICAN AMERICAN: 107 mL/min/{1.73_m2} (ref >59)
Globulin, Total: 2.8 g/dL (ref 1.5–4.5)
Glucose: 98 mg/dL (ref 65–99)
POTASSIUM: 4.2 mmol/L (ref 3.5–5.2)
Sodium: 141 mmol/L (ref 134–144)
Total Protein: 7.3 g/dL (ref 6.0–8.5)

## 2017-02-21 LAB — CBC
Hematocrit: 45.5 % (ref 37.5–51.0)
Hemoglobin: 15.2 g/dL (ref 13.0–17.7)
MCH: 30 pg (ref 26.6–33.0)
MCHC: 33.4 g/dL (ref 31.5–35.7)
MCV: 90 fL (ref 79–97)
PLATELETS: 192 10*3/uL (ref 150–379)
RBC: 5.06 x10E6/uL (ref 4.14–5.80)
RDW: 13.6 % (ref 12.3–15.4)
WBC: 7.1 10*3/uL (ref 3.4–10.8)

## 2017-02-21 LAB — HEMOGLOBIN A1C
ESTIMATED AVERAGE GLUCOSE: 151 mg/dL
Hgb A1c MFr Bld: 6.9 % — ABNORMAL HIGH (ref 4.8–5.6)

## 2017-02-21 LAB — LIPID PANEL
CHOL/HDL RATIO: 4.6 ratio (ref 0.0–5.0)
CHOLESTEROL TOTAL: 156 mg/dL (ref 100–199)
HDL: 34 mg/dL — ABNORMAL LOW (ref >39)
LDL Calculated: 99 mg/dL (ref 0–99)
TRIGLYCERIDES: 113 mg/dL (ref 0–149)
VLDL Cholesterol Cal: 23 mg/dL (ref 5–40)

## 2017-02-21 LAB — TSH: TSH: 2.13 u[IU]/mL (ref 0.450–4.500)

## 2017-02-21 LAB — HIV ANTIBODY (ROUTINE TESTING W REFLEX): HIV SCREEN 4TH GENERATION: NONREACTIVE

## 2017-02-26 ENCOUNTER — Encounter: Payer: Self-pay | Admitting: Urgent Care

## 2017-03-27 ENCOUNTER — Telehealth: Payer: Self-pay | Admitting: Urgent Care

## 2017-03-27 NOTE — Telephone Encounter (Signed)
Tried to call pt to reschedule their appt they have with Isaac BambergMario Hall on 08/21/2017. Urban GibsonMani has had a change in his schedule and will not be available that day.  When pt calls back, please reschedule for a different day.  Thanks!

## 2017-04-16 ENCOUNTER — Encounter: Payer: Self-pay | Admitting: Physician Assistant

## 2017-04-16 ENCOUNTER — Ambulatory Visit (INDEPENDENT_AMBULATORY_CARE_PROVIDER_SITE_OTHER): Payer: BLUE CROSS/BLUE SHIELD | Admitting: Physician Assistant

## 2017-04-16 ENCOUNTER — Other Ambulatory Visit: Payer: Self-pay

## 2017-04-16 VITALS — BP 112/76 | HR 86 | Temp 98.4°F | Resp 18 | Ht 64.0 in | Wt 191.8 lb

## 2017-04-16 DIAGNOSIS — R509 Fever, unspecified: Secondary | ICD-10-CM

## 2017-04-16 DIAGNOSIS — H6690 Otitis media, unspecified, unspecified ear: Secondary | ICD-10-CM | POA: Diagnosis not present

## 2017-04-16 DIAGNOSIS — R05 Cough: Secondary | ICD-10-CM | POA: Diagnosis not present

## 2017-04-16 DIAGNOSIS — R52 Pain, unspecified: Secondary | ICD-10-CM | POA: Diagnosis not present

## 2017-04-16 DIAGNOSIS — R058 Other specified cough: Secondary | ICD-10-CM

## 2017-04-16 LAB — POC INFLUENZA A&B (BINAX/QUICKVUE)
Influenza A, POC: NEGATIVE
Influenza B, POC: NEGATIVE

## 2017-04-16 MED ORDER — AMOXICILLIN-POT CLAVULANATE 875-125 MG PO TABS: 1.0000 | ORAL_TABLET | Freq: Two times a day (BID) | ORAL | 0 refills | Status: AC

## 2017-04-16 MED ORDER — BENZONATATE 100 MG PO CAPS: 100.0000 mg | ORAL_CAPSULE | Freq: Three times a day (TID) | ORAL | 0 refills | Status: DC | PRN

## 2017-04-16 NOTE — Progress Notes (Signed)
MRN: [id number] DOB: 04-Feb-1971  Subjective:   Isaac Hall is a 47 y.o. male presenting for chief complaint of Generalized Body Aches (started last night ) .  Reports 2 day history of onset feeling of being run down. Has some body aches, fever, chills, and dry cough. Notes about a week ago he started having left ear pain, which has worsened over the past week but he has just tried to ignore it. Denies decreased hearing, tinnitus, sneezing, sinus pain, sore throat, wheezing, shortness of breath and chest pain, heart palpitations, nausea, vomiting, abdominal pain and diarrhea. Has not tried anything for relief. Has had sick contact with grandmother. Has history of seasonal allergies, no history of asthma or COPD. Patient has had flu shot this season. Denies smoking. Denies any other aggravating or relieving factors, no other questions or concerns.  Isaac Hall has a current medication list which includes the following prescription(s): accu-chek aviva plus, accu-chek aviva plus, blood glucose meter kit and supplies, citalopram, lisinopril, metformin, amoxicillin-clavulanate, atorvastatin, benzonatate, meclizine, and naproxen sodium. Also has No Known Allergies.  Isaac Hall  has a past medical history of Anxiety and Diabetes mellitus without complication ([place]). Also  has a past surgical history that includes Vasectomy.   Objective:   Vitals: BP 112/76   Pulse 86   Temp 98.4 F (36.9 C) (Oral)   Resp 18   Ht '5\' 4"'$  (1.626 m)   Wt 191 lb 12.8 oz (87 kg)   SpO2 97%   BMI 32.92 kg/m   Physical Exam  Constitutional: He is oriented to person, place, and time. He appears well-developed and well-nourished. No distress.  HENT:  Head: Normocephalic and atraumatic.  Right Ear: Tympanic membrane, external ear and ear canal normal.  Left Ear: No drainage or tenderness. No mastoid tenderness. Tympanic membrane is erythematous and bulging.  Nose: Nose normal. Right sinus exhibits no maxillary  sinus tenderness and no frontal sinus tenderness. Left sinus exhibits no maxillary sinus tenderness and no frontal sinus tenderness.  Mouth/Throat: Uvula is midline and mucous membranes are normal. Posterior oropharyngeal erythema present. Tonsils are 1+ on the right. Tonsils are 1+ on the left. No tonsillar exudate.  Eyes: Conjunctivae are normal.  Neck: Normal range of motion.  Cardiovascular: Normal rate, regular rhythm, normal heart sounds and intact distal pulses.  Pulmonary/Chest: Effort normal. He has no wheezes. He has no rhonchi. He has no rales.  Lymphadenopathy:       Head (right side): No submental, no submandibular, no tonsillar, no preauricular, no posterior auricular and no occipital adenopathy present.       Head (left side): No submental, no submandibular, no tonsillar, no preauricular, no posterior auricular and no occipital adenopathy present.    He has no cervical adenopathy.       Right: No supraclavicular adenopathy present.       Left: No supraclavicular adenopathy present.  Neurological: He is alert and oriented to person, place, and time.  Skin: Skin is warm and dry.  Psychiatric: He has a normal mood and affect.  Vitals reviewed.   Results for orders placed or performed in visit on 04/16/17 (from the past 24 hour(s))  POC Influenza A&B(BINAX/QUICKVUE)     Status: None   Collection Time: 04/16/17  4:56 PM  Result Value Ref Range   Influenza A, POC Negative Negative   Influenza B, POC Negative Negative    Assessment and Plan :  1. Generalized body aches - POC Influenza A&B(BINAX/QUICKVUE) 2. Fever, unspecified 3.  Acute otitis media, unspecified otitis media type POC testing negative for the flu.  Patient is overall well-appearing, no distress.  Vitals stable.  Physical exam findings consistent with acute otitis media.  Will treat accordingly.  Recommended over-the-counter ibuprofen as prescribed for pain. Advised to return to clinic if symptoms worsen, do not  improve, or as needed - amoxicillin-clavulanate (AUGMENTIN) 875-125 MG tablet; Take 1 tablet by mouth 2 (two) times daily for 7 days.  Dispense: 14 tablet; Refill: 0  4. Dry cough Lungs CTAB.  - benzonatate (TESSALON) 100 MG capsule; Take 1-2 capsules (100-200 mg total) by mouth 3 (three) times daily as needed for cough.  Dispense: 40 capsule; Refill: 0  Tenna Delaine, PA-C  [facility] at [place] 04/16/2017 9:32 PM

## 2017-04-16 NOTE — Patient Instructions (Addendum)
  We are going to treat your ear infection with an antibiotic.  I recommend you take as prescribed.  Also recommend you use ibuprofen 600 mg every 8 hours.  Also recommend you stay well-hydrated with water.  Continue to rest until he feel better.  If any of your symptoms worsen or you develop new concerning symptoms, please return for further evaluation.Thank you for letting me participate in your health and well being.  Otitis Media, Adult Otitis media is redness, soreness, and puffiness (swelling) in the space just behind your eardrum (middle ear). It may be caused by allergies or infection. It often happens along with a cold. Follow these instructions at home:  Take your medicine as told. Finish it even if you start to feel better.  Only take over-the-counter or prescription medicines for pain, discomfort, or fever as told by your doctor.  Follow up with your doctor as told. Contact a doctor if:  You have otitis media only in one ear, or bleeding from your nose, or both.  You notice a lump on your neck.  You are not getting better in 3-5 days.  You feel worse instead of better. Get help right away if:  You have pain that is not helped with medicine.  You have puffiness, redness, or pain around your ear.  You get a stiff neck.  You cannot move part of your face (paralysis).  You notice that the bone behind your ear hurts when you touch it. This information is not intended to replace advice given to you by your health care provider. Make sure you discuss any questions you have with your health care provider. Document Released: 08/21/2007 Document Revised: 08/10/2015 Document Reviewed: 09/29/2012 Elsevier Interactive Patient Education  2017 [employer]    IF you received an x-ray today, you will receive an invoice from [facility]. Please contact [facility] at [phone] with questions or concerns regarding your invoice.   IF you received labwork today,  you will receive an invoice from [facility]. Please contact [facility] at [phone] with questions or concerns regarding your invoice.   Our billing staff will not be able to assist you with questions regarding bills from these companies.  You will be contacted with the lab results as soon as they are available. The fastest way to get your results is to activate your My Chart account. Instructions are located on the last page of this paperwork. If you have not heard from us regarding the results in 2 weeks, please contact this office.

## 2017-04-24 ENCOUNTER — Ambulatory Visit: Payer: BLUE CROSS/BLUE SHIELD | Admitting: Physician Assistant

## 2017-05-14 ENCOUNTER — Other Ambulatory Visit: Payer: Self-pay | Admitting: Urgent Care

## 2017-06-18 DIAGNOSIS — M7712 Lateral epicondylitis, left elbow: Secondary | ICD-10-CM | POA: Diagnosis not present

## 2017-07-29 ENCOUNTER — Encounter: Payer: Self-pay | Admitting: Urgent Care

## 2017-07-29 ENCOUNTER — Ambulatory Visit (INDEPENDENT_AMBULATORY_CARE_PROVIDER_SITE_OTHER): Payer: BLUE CROSS/BLUE SHIELD | Admitting: Urgent Care

## 2017-07-29 VITALS — BP 120/72 | HR 79 | Temp 98.2°F | Resp 17 | Ht 64.0 in | Wt 192.0 lb

## 2017-07-29 DIAGNOSIS — E786 Lipoprotein deficiency: Secondary | ICD-10-CM | POA: Diagnosis not present

## 2017-07-29 DIAGNOSIS — E119 Type 2 diabetes mellitus without complications: Secondary | ICD-10-CM | POA: Diagnosis not present

## 2017-07-29 DIAGNOSIS — E669 Obesity, unspecified: Secondary | ICD-10-CM

## 2017-07-29 MED ORDER — ATORVASTATIN CALCIUM 10 MG PO TABS: 10.0000 mg | ORAL_TABLET | Freq: Every day | ORAL | 3 refills | Status: DC

## 2017-07-29 MED ORDER — LISINOPRIL 5 MG PO TABS: 5.0000 mg | ORAL_TABLET | Freq: Every day | ORAL | 3 refills | Status: DC

## 2017-07-29 MED ORDER — BLOOD GLUCOSE METER KIT: PACK | 0 refills | Status: DC

## 2017-07-29 MED ORDER — METFORMIN HCL 500 MG PO TABS: 500.0000 mg | ORAL_TABLET | Freq: Two times a day (BID) | ORAL | 3 refills | Status: DC

## 2017-07-29 NOTE — Patient Instructions (Addendum)
Diabetes mellitus y nutricin Diabetes Mellitus and Nutrition Si sufre de diabetes (diabetes mellitus), es muy importante tener hbitos alimenticios saludables debido a que sus niveles de azcar en la sangre (glucosa) se ven afectados en gran medida por lo que come y bebe. Comer alimentos saludables en las cantidades adecuadas, aproximadamente a la misma hora todos los das, lo ayudar a:  Controlar la glucemia.  Disminuir el riesgo de sufrir una enfermedad cardaca.  Mejorar la presin arterial.  Alcanzar o mantener un peso saludable.  Todas las personas que sufren de diabetes son diferentes y cada una tiene necesidades diferentes en cuanto a un plan de alimentacin. El mdico puede recomendarle que trabaje con un especialista en dietas y nutricin (nutricionista) para elaborar el mejor plan para usted. Su plan de alimentacin puede variar segn factores como:  Las caloras que necesita.  Los medicamentos que toma.  Su peso.  Sus niveles de glucemia, presin arterial y colesterol.  Su nivel de actividad.  Otras afecciones que tenga, como enfermedades cardacas o renales.  Cmo me afectan los carbohidratos? Los carbohidratos afectan el nivel de glucemia ms que cualquier otro tipo de alimento. La ingesta de carbohidratos naturalmente aumenta la cantidad glucosa en la sangre. El recuento de carbohidratos es un mtodo destinado a llevar un registro de la cantidad de carbohidratos que se ingieren. El recuento de carbohidratos es importante para mantener la glucemia a un nivel saludable, en especial si utiliza insulina o toma determinados medicamentos por va oral para la diabetes. Es importante saber la cantidad de carbohidratos que se pueden ingerir en cada comida sin correr ningn riesgo. Esto es diferente en cada persona. El nutricionista puede ayudarlo a calcular la cantidad de carbohidratos que debe ingerir en cada comida y colacin. Los alimentos que contienen carbohidratos  incluyen:  Pan, cereal, arroz, pasta y galletas.  Papas y maz.  Guisantes, frijoles y lentejas.  Leche y yogur.  Frutas y jugo.  Postres, como pasteles, galletitas, helado y caramelos.  Cmo me afecta el alcohol? El alcohol puede provocar disminuciones sbitas de la glucemia (hipoglucemia), en especial si utiliza insulina o toma determinados medicamentos por va oral para la diabetes. La hipoglucemia es una afeccin potencialmente mortal. Los sntomas de la hipoglucemia (somnolencia, mareos y confusin) son similares a los sntomas de haber consumido demasiado alcohol. Si el mdico afirma que el alcohol es seguro para usted, siga estas pautas:  Limite el consumo de alcohol a no ms de 1 medida por da si es mujer y no est embarazada, y a 2 medidas si es hombre. Una medida equivale a 12oz (355ml) de cerveza, 5oz (148ml) de vino o 1oz (44ml) de bebidas de alta graduacin alcohlica.  No beba con el estmago vaco.  Mantngase hidratado con agua, gaseosas dietticas o t helado sin azcar.  Tenga en cuenta que las gaseosas comunes, los jugos y otros refrescos pueden contener mucha azcar y se deben contar como carbohidratos.  Consejos para seguir este plan Leer las etiquetas de los alimentos  Comience por controlar el tamao de la porcin en la etiqueta. La cantidad de caloras, carbohidratos, grasas y otros nutrientes mencionados en la etiqueta se basan en una porcin del alimento. Muchos alimentos contienen ms de una porcin por envase.  Verifique la cantidad total de gramos (g) de carbohidratos totales en una porcin. Puede calcular la cantidad de porciones de carbohidratos al dividir el total de carbohidratos por 15. Por ejemplo, si un alimento posee un total de 30g de carbohidratos, equivale a 2 porciones   de carbohidratos.  Verifique la cantidad de gramos (g) de grasas saturadas y grasas trans en una porcin. Escoja alimentos que no contengan grasa o que tengan un bajo  contenido.  Controle la cantidad de miligramos (mg) de sodio en una porcin. La mayora de las personas deben limitar la ingesta de sodio total a menos de 2300mg por da.  Siempre consulte la informacin nutricional de los alimentos etiquetados como "con bajo contenido de grasa" o "sin grasa". Estos alimentos pueden ser ms altos en azcar agregada o en carbohidratos refinados y deben evitarse.  Hable con el nutricionista para identificar sus objetivos diarios en cuanto a los nutrientes mencionados en la etiqueta. De compras  Evite comprar alimentos procesados, enlatados o prehechos. Estos alimentos tienden a tener mayor cantidad de grasa, sodio y azcar agregada.  Compre en la zona exterior de la tienda de comestibles. Esta incluye frutas y vegetales frescos, granos a granel, carnes frescas y productos lcteos frescos. Coccin  Utilice mtodos de coccin a baja temperatura, como hornear, en lugar de mtodos de coccin a alta temperatura, como frer en abundante aceite.  Cocine con aceites saludables, como el aceite de oliva, canola o girasol.  Evite cocinar con manteca, crema o carnes con alto contenido de grasa. Planificacin de las comidas  Consuma las comidas y las colaciones de forma regular, preferentemente a la misma hora todos los das. Evite pasar largos perodos de tiempo sin comer.  Consuma alimentos ricos en fibra, como frutas frescas, verduras, frijoles y cereales integrales. Consulte al nutricionista sobre cuntas porciones de carbohidratos puede consumir en cada comida.  Consuma entre 4 y 6 onzas de protenas magras por da, como carnes magras, pollo, pescado, huevos o tofu. 1 onza equivale a 1 onza de carne, pollo o pescado, 1 huevo, o 1/4 taza de tofu.  Coma algunos alimentos por da que contengan grasas saludables, como aguacates, frutos secos, semillas y pescado. Estilo de vida   Controle su nivel de glucemia con regularidad.  Haga ejercicio al menos 30minutos,  5das o ms por semana, o como se lo haya indicado el mdico.  Tome los medicamentos como se lo haya indicado el mdico.  No consuma ningn producto que contenga nicotina o tabaco, como cigarrillos y cigarrillos electrnicos. Si necesita ayuda para dejar de fumar, consulte al mdico.  Trabaje con un asesor o instructor en diabetes para identificar estrategias para controlar el estrs y cualquier desafo emocional y social. Cules son algunas de las preguntas que puedo hacerle a mi mdico?  Es necesario que me rena con un instructor en diabetes?  Es necesario que me rena con un nutricionista?  A qu nmero puedo llamar si tengo preguntas?  Cules son los mejores momentos para controlar la glucemia? Dnde encontrar ms informacin:  Asociacin Americana de la Diabetes (American Diabetes Association): diabetes.org/food-and-fitness/food  Academia de Nutricin y Diettica (Academy of Nutrition and Dietetics): www.eatright.org/resources/health/diseases-and-conditions/diabetes  Instituto Nacional de la Diabetes y las Enfermedades Digestivas y Renales (National Institute of Diabetes and Digestive and Kidney Diseases) (Institutos Nacionales de Salud, NIH): www.niddk.nih.gov/health-information/diabetes/overview/diet-eating-physical-activity Resumen  Un plan de alimentacin saludable lo ayudar a controlar la glucemia y mantener un estilo de vida saludable.  Trabajar con un especialista en dietas y nutricin (nutricionista) puede ayudarlo a elaborar el mejor plan de alimentacin para usted.  Tenga en cuenta que los carbohidratos y el alcohol tienen efectos inmediatos en sus niveles de glucemia. Es importante contar los carbohidratos y consumir alcohol con prudencia. Esta informacin no tiene como fin reemplazar el consejo del   mdico. Asegrese de hacerle al mdico cualquier pregunta que tenga. Document Released: 06/11/2007 Document Revised: 06/24/2016 Document Reviewed:  06/24/2016 Elsevier Interactive Patient Education  2018 Elsevier Inc.     IF you received an x-ray today, you will receive an invoice from [facility]. Please contact [facility] at [phone] with questions or concerns regarding your invoice.   IF you received labwork today, you will receive an invoice from [facility]. Please contact [facility] at [phone] with questions or concerns regarding your invoice.   Our billing staff will not be able to assist you with questions regarding bills from these companies.  You will be contacted with the lab results as soon as they are available. The fastest way to get your results is to activate your My Chart account. Instructions are located on the last page of this paperwork. If you have not heard from us regarding the results in 2 weeks, please contact this office.     

## 2017-07-29 NOTE — Progress Notes (Signed)
    MRN: [id number]  Subjective:   Isaac Hall is a 47 y.o. male who presents for follow up of Type 2 Diabetes Mellitus. Patient is currently managed with 500 mg metformin twice daily. Patient is not checking home blood sugars.  Reports that he needs a new glucometer.  Patient denies blurred vision, polydipsia, chest pain, nausea, vomiting, abdominal pain, hematuria, polyuria, skin infections, numbness or tingling. Patient is checking their feet daily. Denies foot concerns. Denies smoking cigarettes or drinking alcohol.   Known diabetic complications: none  Isaac Hall has a current medication list which includes the following prescription(s): accu-chek aviva plus, accu-chek aviva plus, atorvastatin, benzonatate, blood glucose meter kit and supplies, citalopram, lisinopril, meclizine, metformin, metformin, and naproxen sodium. Also has No Known Allergies.  Isaac Hall  has a past medical history of Anxiety and Diabetes mellitus without complication ([place]). Also  has a past surgical history that includes Vasectomy.   Objective:   PHYSICAL EXAM BP 120/72   Pulse 79   Temp 98.2 F (36.8 C) (Oral)   Resp 17   Ht '5\' 4"'$  (1.626 m)   Wt 192 lb (87.1 kg)   SpO2 98%   BMI 32.96 kg/m   The 10-year ASCVD risk score Mikey Bussing DC Jr., et al., 2013) is: 4.6%   Values used to calculate the score:     Age: 46 years     Sex: Male     Is Non-Hispanic African American: No     Diabetic: Yes     Tobacco smoker: No     Systolic Blood Pressure: 726 mmHg     Is BP treated: No     HDL Cholesterol: 34 mg/dL     Total Cholesterol: 156 mg/dL   Physical Exam  Constitutional: He is oriented to person, place, and time. He appears well-developed and well-nourished.  HENT:  Mouth/Throat: Oropharynx is clear and moist.  Eyes: Pupils are equal, round, and reactive to light. EOM are normal. No scleral icterus.  Cardiovascular: Normal rate, regular rhythm and intact distal pulses. Exam reveals no gallop and no  friction rub.  No murmur heard. Pulmonary/Chest: No respiratory distress. He has no wheezes. He has no rales.  Abdominal: Soft. Bowel sounds are normal. He exhibits no distension and no mass. There is no tenderness. There is no rebound and no guarding.  Musculoskeletal: He exhibits no edema.  Neurological: He is alert and oriented to person, place, and time.  Skin: Skin is warm and dry.  Psychiatric: He has a normal mood and affect.   Diabetic Foot Exam - Simple   No data filed      Assessment and Plan :   Controlled type 2 diabetes mellitus without complication, without long-term current use of insulin ([place]) - Plan: Lipid panel, Comprehensive metabolic panel, Hemoglobin A1c, atorvastatin (LIPITOR) 10 MG tablet, lisinopril (PRINIVIL,ZESTRIL) 5 MG tablet, HM DIABETES FOOT EXAM, blood glucose meter kit and supplies  Low HDL (under 40) - Plan: atorvastatin (LIPITOR) 10 MG tablet  Obesity, unspecified classification, unspecified obesity type, unspecified whether serious comorbidity present  Refilled metformin at 500 mg twice daily.  Also refilled his atorvastatin and lisinopril.  New prescription provided for his glucometer with supplies.  Labs pending.  Follow-up in 6 months to 1 year.  Okay to refill his Celexa up until that time as well.  Today he states he is okay without refills.  [name], PA-C [facility] [phone] 07/29/2017 2:58 PM

## 2017-07-30 LAB — COMPREHENSIVE METABOLIC PANEL
ALBUMIN: 4.6 g/dL (ref 3.5–5.5)
ALT: 37 IU/L (ref 0–44)
AST: 25 IU/L (ref 0–40)
Albumin/Globulin Ratio: 2 (ref 1.2–2.2)
Alkaline Phosphatase: 82 IU/L (ref 39–117)
BILIRUBIN TOTAL: 0.2 mg/dL (ref 0.0–1.2)
BUN / CREAT RATIO: 15 (ref 9–20)
BUN: 11 mg/dL (ref 6–24)
CHLORIDE: 100 mmol/L (ref 96–106)
CO2: 22 mmol/L (ref 20–29)
Calcium: 9.8 mg/dL (ref 8.7–10.2)
Creatinine, Ser: 0.75 mg/dL — ABNORMAL LOW (ref 0.76–1.27)
GFR calc Af Amer: 126 mL/min/{1.73_m2} (ref >59)
GFR calc non Af Amer: 109 mL/min/{1.73_m2} (ref >59)
GLUCOSE: 82 mg/dL (ref 65–99)
Globulin, Total: 2.3 g/dL (ref 1.5–4.5)
POTASSIUM: 4.2 mmol/L (ref 3.5–5.2)
SODIUM: 139 mmol/L (ref 134–144)
TOTAL PROTEIN: 6.9 g/dL (ref 6.0–8.5)

## 2017-07-30 LAB — LIPID PANEL
CHOL/HDL RATIO: 4.3 ratio (ref 0.0–5.0)
Cholesterol, Total: 146 mg/dL (ref 100–199)
HDL: 34 mg/dL — AB (ref >39)
LDL Calculated: 63 mg/dL (ref 0–99)
Triglycerides: 245 mg/dL — ABNORMAL HIGH (ref 0–149)
VLDL Cholesterol Cal: 49 mg/dL — ABNORMAL HIGH (ref 5–40)

## 2017-07-30 LAB — HEMOGLOBIN A1C
ESTIMATED AVERAGE GLUCOSE: 140 mg/dL
HEMOGLOBIN A1C: 6.5 % — AB (ref 4.8–5.6)

## 2017-08-04 ENCOUNTER — Encounter: Payer: Self-pay | Admitting: Physician Assistant

## 2017-08-04 ENCOUNTER — Other Ambulatory Visit: Payer: Self-pay

## 2017-08-04 ENCOUNTER — Ambulatory Visit (INDEPENDENT_AMBULATORY_CARE_PROVIDER_SITE_OTHER): Payer: BLUE CROSS/BLUE SHIELD | Admitting: Physician Assistant

## 2017-08-04 VITALS — BP 124/80 | HR 102 | Temp 99.6°F | Resp 18 | Ht 64.0 in | Wt 188.2 lb

## 2017-08-04 DIAGNOSIS — R062 Wheezing: Secondary | ICD-10-CM | POA: Diagnosis not present

## 2017-08-04 DIAGNOSIS — R05 Cough: Secondary | ICD-10-CM | POA: Diagnosis not present

## 2017-08-04 DIAGNOSIS — R6889 Other general symptoms and signs: Secondary | ICD-10-CM | POA: Diagnosis not present

## 2017-08-04 DIAGNOSIS — R059 Cough, unspecified: Secondary | ICD-10-CM

## 2017-08-04 MED ORDER — BENZONATATE 100 MG PO CAPS: 100.0000 mg | ORAL_CAPSULE | Freq: Three times a day (TID) | ORAL | 0 refills | Status: DC | PRN

## 2017-08-04 MED ORDER — HYDROCODONE-HOMATROPINE 5-1.5 MG/5ML PO SYRP: 5.0000 mL | ORAL_SOLUTION | Freq: Three times a day (TID) | ORAL | 0 refills | Status: DC | PRN

## 2017-08-04 MED ORDER — ALBUTEROL SULFATE HFA 108 (90 BASE) MCG/ACT IN AERS: 2.0000 | INHALATION_SPRAY | Freq: Four times a day (QID) | RESPIRATORY_TRACT | 0 refills | Status: DC | PRN

## 2017-08-04 NOTE — Progress Notes (Signed)
Isaac Hall  MRN: [id number] DOB: Nov 24, 1970  PCP: Morrell Riddle, PA-C  Chief Complaint  Patient presents with  . Cough    x3days having bodyaches and headaches     Subjective:  Pt presents to clinic for cold symptoms for the last 5 days.  Started with myalgias 5 days ago and his symptoms continued to get worse.  He has cough that has green/yellow all day and headaches.  Daughter was sick with similar symptoms last week but she is now well. He has been using benadryl and pain medications.  He has been having seasonal allergies with being outside more and he would like something but he has tried nothing OTC.  Glucose are not higher than normal  He had his flu vaccine this year.  History is obtained by patient.  Review of Systems  Constitutional: Positive for fever. Negative for chills.  HENT: Positive for sore throat (at beginning). Negative for congestion, rhinorrhea and sneezing.   Respiratory: Positive for cough. Negative for shortness of breath and wheezing.        Nonsmoker, no h/o asthma or use of inhaler in the past  Gastrointestinal: Negative.   Musculoskeletal: Positive for myalgias.  Allergic/Immunologic: Positive for environmental allergies.  Neurological: Positive for headaches.    There are no active problems to display for this patient.   Current Outpatient Medications on File Prior to Visit  Medication Sig Dispense Refill  . atorvastatin (LIPITOR) 10 MG tablet Take 1 tablet (10 mg total) by mouth daily. 90 tablet 3  . citalopram (CELEXA) 20 MG tablet Take 1 tablet (20 mg total) by mouth daily. 90 tablet 3  . lisinopril (PRINIVIL,ZESTRIL) 5 MG tablet Take 1 tablet (5 mg total) by mouth daily. 90 tablet 3  . meclizine (ANTIVERT) 25 MG tablet Take 1 tablet (25 mg total) by mouth 3 (three) times daily as needed for dizziness. 30 tablet 0  . metFORMIN (GLUCOPHAGE) 500 MG tablet Take 1 tablet (500 mg total) by mouth 2 (two) times daily with a meal. 180  tablet 3  . ONETOUCH DELICA LANCETS 33G MISC      No current facility-administered medications on file prior to visit.     No Known Allergies  Past Medical History:  Diagnosis Date  . Anxiety   . Diabetes mellitus without complication [facility])    Social History   Social History Narrative   Lives with his wife and their 2 children.   Originally from [place].  Came to the [place] in 1995.   Social History   Tobacco Use  . Smoking status: Never Smoker  . Smokeless tobacco: Never Used  Substance Use Topics  . Alcohol use: No  . Drug use: No   family history includes Cancer in his mother; Diabetes in his father, mother, sister, sister, sister, and sister; Hypertension in his sister.     Objective:  BP 124/80   Pulse (!) 102   Temp 99.6 F (37.6 C) (Oral)   Resp 18   Ht  (1.626 m)   Wt 188 lb 3.2 oz (85.4 kg)   SpO2 96%   BMI 32.30 kg/m  Body mass index is 32.3 kg/m.  Physical Exam  Constitutional: He is oriented to person, place, and time. He appears well-developed and well-nourished.  Appears to not feel well.  HENT:  Head: Normocephalic and atraumatic.  Right Ear: Hearing, tympanic membrane, external ear and ear canal normal.  Left Ear: Hearing, tympanic membrane, external ear and ear canal  normal.  Nose: Nose normal.  Mouth/Throat: Uvula is midline, oropharynx is clear and moist and mucous membranes are normal.  Eyes: Conjunctivae are normal.  Neck: Normal range of motion.  Cardiovascular: Normal rate, regular rhythm and normal heart sounds.  Pulmonary/Chest: Effort normal. He has wheezes (end expiratory - worsens when he has forced expiration and takes deeper inspirations).  Lymphadenopathy:       Head (right side): No tonsillar adenopathy present.       Head (left side): No tonsillar adenopathy present.    He has no cervical adenopathy.       Right: No supraclavicular adenopathy present.       Left: No supraclavicular adenopathy present.  Neurological: He  is alert and oriented to person, place, and time.  Skin: Skin is warm and dry.  Psychiatric: Judgment normal.  Vitals reviewed.   Assessment and Plan :  Flu-like symptoms  Cough - Plan: benzonatate (TESSALON) 100 MG capsule, HYDROcodone-homatropine (HYCODAN) 5-1.5 MG/5ML syrup, albuterol (PROVENTIL HFA;VENTOLIN HFA) 108 (90 Base) MCG/ACT inhaler  Wheezing   Patient likely has the flu but outside of the window for testing and treatment with antiviral.  Will treat symptoms with cough medications.  Expect his bronchospasm and wheezing to be 1 of his causes of his cough so we discussed how he can use the albuterol to help with those symptoms.  He will push fluids and use Tylenol and Motrin as needed for myalgias and subjective fevers.  Discussed with patient's warning signs of when to return to clinic for this on this.  Benny Lennert PA-C  Primary Care at [facility] 08/05/2017 8:24 AM

## 2017-08-04 NOTE — Patient Instructions (Addendum)
  For allergies Zyrtec Allegra Claritin   IF you received an x-ray today, you will receive an invoice from [facility]. Please contact [facility] at [phone] with questions or concerns regarding your invoice.   IF you received labwork today, you will receive an invoice from [place]. Please contact [facility] at [phone] with questions or concerns regarding your invoice.   Our billing staff will not be able to assist you with questions regarding bills from these companies.  You will be contacted with the lab results as soon as they are available. The fastest way to get your results is to activate your My Chart account. Instructions are located on the last page of this paperwork. If you have not heard from [place] regarding the results in 2 weeks, please contact this office.

## 2017-08-05 ENCOUNTER — Encounter: Payer: Self-pay | Admitting: Physician Assistant

## 2017-08-18 ENCOUNTER — Ambulatory Visit (INDEPENDENT_AMBULATORY_CARE_PROVIDER_SITE_OTHER): Payer: BLUE CROSS/BLUE SHIELD | Admitting: Urgent Care

## 2017-08-18 ENCOUNTER — Ambulatory Visit: Payer: Self-pay

## 2017-08-18 ENCOUNTER — Encounter: Payer: Self-pay | Admitting: Urgent Care

## 2017-08-18 VITALS — BP 130/92 | HR 94 | Temp 98.4°F | Resp 18 | Ht 64.0 in | Wt 188.4 lb

## 2017-08-18 DIAGNOSIS — R42 Dizziness and giddiness: Secondary | ICD-10-CM | POA: Diagnosis not present

## 2017-08-18 DIAGNOSIS — E119 Type 2 diabetes mellitus without complications: Secondary | ICD-10-CM

## 2017-08-18 DIAGNOSIS — R110 Nausea: Secondary | ICD-10-CM

## 2017-08-18 DIAGNOSIS — R51 Headache: Secondary | ICD-10-CM | POA: Diagnosis not present

## 2017-08-18 DIAGNOSIS — R519 Headache, unspecified: Secondary | ICD-10-CM

## 2017-08-18 DIAGNOSIS — Z789 Other specified health status: Secondary | ICD-10-CM

## 2017-08-18 DIAGNOSIS — Z7289 Other problems related to lifestyle: Secondary | ICD-10-CM

## 2017-08-18 DIAGNOSIS — F1090 Alcohol use, unspecified, uncomplicated: Secondary | ICD-10-CM

## 2017-08-18 MED ORDER — VALACYCLOVIR HCL 1 G PO TABS
1000.0000 mg | ORAL_TABLET | Freq: Three times a day (TID) | ORAL | 0 refills | Status: DC
Start: 2017-08-18 — End: 2018-12-28

## 2017-08-18 MED ORDER — ONDANSETRON 8 MG PO TBDP: 8.0000 mg | ORAL_TABLET | Freq: Three times a day (TID) | ORAL | 0 refills | Status: DC | PRN

## 2017-08-18 MED ORDER — MECLIZINE HCL 25 MG PO TABS: 25.0000 mg | ORAL_TABLET | Freq: Three times a day (TID) | ORAL | 0 refills | Status: DC | PRN

## 2017-08-18 NOTE — Telephone Encounter (Signed)
Returned call to pt.  Reported onset of dizziness today when he got up.  Reported it subsided, until he bent over, and stood up at work, then it started again.  Reported it appeared like everything was spinning and tilting.  Has had 3-4 episodes today.   Reported he has never experienced this before.   Stated the left side of his head hurts.  Reported he had one episode that he felt like he could pass out today, but did not.  Is not sure what his blood sugar is; stated it does not feel the same as when his blood sugar is high or low.  Reported he is drinking/ staying hydrated; has consumed three 12 oz. bottles of water today.  Reported is eating and taking his medication on schedule.  Pulse checked while on triage call; pulse was 80/ regular.  Stated has not checked BP recently.  Denied any chest pain, SOB, nausea/ vomiting, blurred vision, weakness /numbness of unilateral extremities, slurred speech.  Reported balance has been off today with the dizziness.  Stated the only medication change is started Atorvastatin approx. 2 weeks ago.    Called Flow Coordinator in office; spoke with RN; advised it would be okay to send to UC or bring pt. To office today at 5:00 PM appt.  Discussed with pt. the recommendations.  Per pt. He prefers to see PA in office at 5:00 PM.  Care advice given per protocol.  Pt. Verb. Understanding.  Agrees with plan.            Reason for Disposition . 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  Onset of dizziness today.  Reported dizziness has been present throughout the day, and increases with sudden movement, or with bending and then standing up; describes that the room is spinning and tilting.  Answer Assessment - Initial Assessment Questions 1. DESCRIPTION: "Describe your dizziness."     Pain left side of head; everything starts spinning when  bending over and then standing back up   2. LIGHTHEADED: "Do you feel lightheaded?" (e.g., somewhat faint, woozy, weak upon standing)     Felt like he could pass out x 1 3. VERTIGO: "Do you feel like either you or the room is spinning or tilting?" (i.e. vertigo)     Yes c/o room spinning and tilting  4. SEVERITY: "How bad is it?"  "Do you feel like you are going to faint?" "Can you stand and walk?"   - MILD - walking normally   - MODERATE - interferes with normal activities (e.g., work, school)    - SEVERE - unable to stand, requires support to walk, feels like passing out now.      Severe earlier  5. ONSET:  "When did the dizziness begin?"     This AM when he 1st got out of bed 6. AGGRAVATING FACTORS: "Does anything make it worse?" (e.g., standing, change in head position)     Change in position or sudden movement 7. HEART RATE: "Can you tell me your heart rate?" "How many beats in 15 seconds?"  (Note: not all patients can do this)       Pulse 80/ regular during call 8. CAUSE: "What do you think is causing the dizziness?"     unknown 9. RECURRENT SYMPTOM: "Have you had dizziness before?" If so, ask: "When was  the last time?" "What happened that time?"     never 10. OTHER SYMPTOMS: "Do you have any other symptoms?" (e.g., fever, chest pain, vomiting, diarrhea, bleeding)       Denies nausea/ vomiting, chest pain, shortness of breath, numbness/ weakness of extremities, vision change.  C/o some balance issues related to dizziness.  Protocols used: DIZZINESS [facility]  Message from Terisa Starr sent at 08/18/2017 3:05 PM EDT   Summary: dizzy   Patient states that when he woke up this morning he was very dizzy. He said it was hard to stand on his feet. He said he also has a slight headache

## 2017-08-18 NOTE — Patient Instructions (Addendum)
Mareos (Dizziness) Los mareos son un problema muy frecuente. Causan sensacin de inestabilidad o de desvanecimiento. Puede sentir que se va a desmayar. Un mareo puede provocarle una lesin si se tropieza o se cae. Cualquier persona puede marearse, pero los [place] son ms frecuentes en los [employer]. Esta afeccin puede tener muchas causas, por ejemplo:  Medicamentos.  Deshidratacin.  Enfermedad. CUIDADOS EN EL HOGAR Estas indicaciones pueden ayudarlo con el trastorno: Comida y bebida  Beba suficiente lquido para Pharmacologist el pis (orina) claro o de color amarillo plido. Esto evita la deshidratacin. Trate de beber ms lquidos transparentes, como agua.  No beba alcohol.  Limite la cantidad de cafena que bebe o come si el mdico se lo indic.  Limite la cantidad de sal que bebe o come si el mdico se lo indic. Actividad  Evite los movimientos rpidos. ? Cuando se levante de una silla, sujtese hasta sentirse bien. ? Por la maana, sintese primero a un lado de la cama. Cuando se sienta bien, pngase lentamente de [address] se sostiene de algo, hasta que sepa que ha logrado el equilibrio.  Mueva las piernas con frecuencia si debe estar de pie en un lugar durante mucho tiempo. Mientras est de pie, contraiga y relaje los msculos de las piernas.  No conduzca vehculos ni utilice maquinarias pesadas si se siente mareado.  Evite agacharse si se siente mareado. En su casa, coloque los objetos de modo que le resulte fcil alcanzarlos sin Public librarian. Estilo de vida  No consuma ningn producto que contenga tabaco, lo que incluye cigarrillos, tabaco de Theatre manager o Administrator, Civil Service. Si necesita ayuda para dejar de fumar, consulte al [employer].  Trate de reducir el nivel de estrs practicando actividades como el yoga o la meditacin. Hable con el mdico si necesita ayuda. Instrucciones generales  Controle sus mareos para ver si hay cambios.  Tome los medicamentos solamente  como se lo haya indicado el mdico. Hable con el mdico si cree que algn medicamento que est tomando es la causa de sus [employer].  Infrmele a un amigo o a un familiar si se siente mareado. Pdale a esta persona que llame al mdico si observa cambios en su comportamiento.  Concurra a todas las visitas de control como se lo haya indicado el mdico. Esto es importante. SOLICITE AYUDA SI:  Los [employer].  Los [employer] o la sensacin de Production assistant, radio.  Siente malestar estomacal (nuseas).  Tiene problemas para escuchar.  Aparecen nuevos sntomas.  Cuando est de pie se siente inestable o que la habitacin da vueltas.  SOLICITE AYUDA DE INMEDIATO SI:  Vomita o tiene diarrea y no puede comer ni beber nada.  Tiene dificultad para lo siguiente: ? Hablar. ? Caminar. ? Tragar. ? Usar los brazos, las manos o las piernas.  Siente una debilidad generalizada.  No piensa con claridad o tiene dificultades para armar oraciones. Es posible que un amigo o un familiar adviertan que esto ocurre.  Tiene los siguientes sntomas: ? Journalist, newspaper. ? Dolor en el vientre (abdomen). ? Falta de aire. ? Sudoracin.  Cambios en la visin.  Hemorragias.  Dolores de [place].  Dolor o rigidez en el cuello.  Grant Ruts.  Esta informacin no tiene Theme park manager el consejo del mdico. Asegrese de hacerle al mdico cualquier pregunta que tenga. Document Released: 02/21/2011 Document Revised: 07/19/2014 Document Reviewed: 02/28/2014 Elsevier Interactive Patient Education  2017 [employer].     IF you received an x-ray today, you will receive an invoice  from [facility]. Please contact [facility] at [phone] with questions or concerns regarding your invoice.   IF you received labwork today, you will receive an invoice from [place]. Please contact [facility] at [phone] with questions or concerns regarding your invoice.   Our billing staff will not  be able to assist you with questions regarding bills from these companies.  You will be contacted with the lab results as soon as they are available. The fastest way to get your results is to activate your My Chart account. Instructions are located on the last page of this paperwork. If you have not heard from [place] regarding the results in 2 weeks, please contact this office.      Dehydration, Adult Dehydration is a condition in which there is not enough fluid or water in the body. This happens when you lose more fluids than you take in. Important organs, such as the kidneys, brain, and heart, cannot function without a proper amount of fluids. Any loss of fluids from the body can lead to dehydration. Dehydration can range from mild to severe. This condition should be treated right away to prevent it from becoming severe. What are the causes? This condition may be caused by:  Vomiting.  Diarrhea.  Excessive sweating, such as from heat exposure or exercise.  Not drinking enough fluid, especially: ? When ill. ? While doing activity that requires a lot of energy.  Excessive urination.  Fever.  Infection.  Certain medicines, such as medicines that cause the body to lose excess fluid (diuretics).  Inability to access safe drinking water.  Reduced physical ability to get adequate water and food.  What increases the risk? This condition is more likely to develop in people:  Who have a poorly controlled long-term (chronic) illness, such as diabetes, heart disease, or kidney disease.  Who are age 46 or older.  Who are disabled.  Who live in a place with high altitude.  Who play endurance sports.  What are the signs or symptoms? Symptoms of mild dehydration may include:  Thirst.  Dry lips.  Slightly dry mouth.  Dry, warm skin.  Dizziness. Symptoms of moderate dehydration may include:  Very dry mouth.  Muscle cramps.  Dark urine. Urine may be the color of  tea.  Decreased urine production.  Decreased tear production.  Heartbeat that is irregular or faster than normal (palpitations).  Headache.  Light-headedness, especially when you stand up from a sitting position.  Fainting (syncope). Symptoms of severe dehydration may include:  Changes in skin, such as: ? Cold and clammy skin. ? Blotchy (mottled) or pale skin. ? Skin that does not quickly return to normal after being lightly pinched and released (poor skin turgor).  Changes in body fluids, such as: ? Extreme thirst. ? No tear production. ? Inability to sweat when body temperature is high, such as in hot weather. ? Very little urine production.  Changes in vital signs, such as: ? Weak pulse. ? Pulse that is more than 100 beats a minute when sitting still. ? Rapid breathing. ? Low blood pressure.  Other changes, such as: ? Sunken eyes. ? Cold hands and feet. ? Confusion. ? Lack of energy (lethargy). ? Difficulty waking up from sleep. ? Short-term weight loss. ? Unconsciousness. How is this diagnosed? This condition is diagnosed based on your symptoms and a physical exam. Blood and urine tests may be done to help confirm the diagnosis. How is this treated? Treatment for this condition depends on the severity.  Mild or moderate dehydration can often be treated at home. Treatment should be started right away. Do not wait until dehydration becomes severe. Severe dehydration is an emergency and it needs to be treated in a hospital. Treatment for mild dehydration may include:  Drinking more fluids.  Replacing salts and minerals in your blood (electrolytes) that you may have lost. Treatment for moderate dehydration may include:  Drinking an oral rehydration solution (ORS). This is a drink that helps you replace fluids and electrolytes (rehydrate). It can be found at pharmacies and retail stores. Treatment for severe dehydration may include:  Receiving fluids through an IV  tube.  Receiving an electrolyte solution through a feeding tube that is passed through your nose and into your stomach (nasogastric tube, or NG tube).  Correcting any abnormalities in electrolytes.  Treating the underlying cause of dehydration. Follow these instructions at home:  If directed by your health care provider, drink an ORS: ? Make an ORS by following instructions on the package. ? Start by drinking small amounts, about  cup (120 mL) every 5-10 minutes. ? Slowly increase how much you drink until you have taken the amount recommended by your health care provider.  Drink enough clear fluid to keep your urine clear or pale yellow. If you were told to drink an ORS, finish the ORS first, then start slowly drinking other clear fluids. Drink fluids such as: ? Water. Do not drink only water. Doing that can lead to having too little salt (sodium) in the body (hyponatremia). ? Ice chips. ? Fruit juice that you have added water to (diluted fruit juice). ? Low-calorie sports drinks.  Avoid: ? Alcohol. ? Drinks that contain a lot of sugar. These include high-calorie sports drinks, fruit juice that is not diluted, and soda. ? Caffeine. ? Foods that are greasy or contain a lot of fat or sugar.  Take over-the-counter and prescription medicines only as told by your health care provider.  Do not take sodium tablets. This can lead to having too much sodium in the body (hypernatremia).  Eat foods that contain a healthy balance of electrolytes, such as bananas, oranges, potatoes, tomatoes, and spinach.  Keep all follow-up visits as told by your health care provider. This is important. Contact a health care provider if:  You have abdominal pain that: ? Gets worse. ? Stays in one area (localizes).  You have a rash.  You have a stiff neck.  You are more irritable than usual.  You are sleepier or more difficult to wake up than usual.  You feel weak or dizzy.  You feel very  thirsty.  You have urinated only a small amount of very dark urine over 6-8 hours. Get help right away if:  You have symptoms of severe dehydration.  You cannot drink fluids without vomiting.  Your symptoms get worse with treatment.  You have a fever.  You have a severe headache.  You have vomiting or diarrhea that: ? Gets worse. ? Does not go away.  You have blood or green matter (bile) in your vomit.  You have blood in your stool. This may cause stool to look black and tarry.  You have not urinated in 6-8 hours.  You faint.  Your heart rate while sitting still is over 100 beats a minute.  You have trouble breathing. This information is not intended to replace advice given to you by your health care provider. Make sure you discuss any questions you have with your health care  provider. Document Released: 03/04/2005 Document Revised: 09/29/2015 Document Reviewed: 04/28/2015 Elsevier Interactive Patient Education  2018 [employer]     IF you received an x-ray today, you will receive an invoice from [facility]. Please contact [facility] at [phone] with questions or concerns regarding your invoice.   IF you received labwork today, you will receive an invoice from [facility]. Please contact [facility] at [phone] with questions or concerns regarding your invoice.   Our billing staff will not be able to assist you with questions regarding bills from these companies.  You will be contacted with the lab results as soon as they are available. The fastest way to get your results is to activate your My Chart account. Instructions are located on the last page of this paperwork. If you have not heard from us regarding the results in 2 weeks, please contact this office.

## 2017-08-18 NOTE — Progress Notes (Signed)
    MRN: [id number] DOB: 1970/08/23  Subjective:   Isaac Hall is a 47 y.o. male presenting for acute onset of dizziness, vertigo, left sided headache, transient left ear tinnitus, nausea without vomiting malaise. Denies fever, sinus pain, sinus congestion, sore throat, cough, chest pain, shob, wheezing, n/v, abdominal pain, rash. Has not tried any medications for relief.  Patient has been working outdoors pretty vigorously and has not hydrated as well as he could have.  He does admit that 2 days ago he drank very heavily including beer and liquor, approximately 10 drinks.  He had another alcohol drink today.  Denies having consistent heavy alcohol use.  He cannot recall drinking this quantity in the past 15 years.  He has not hydrated very much in the past 2 days either.  Isaac Hall has a current medication list which includes the following prescription(s): albuterol, atorvastatin, benzonatate, citalopram, hydrocodone-homatropine, lisinopril, meclizine, metformin, and onetouch delica lancets 33g. Also has No Known Allergies.  Isaac Hall  has a past medical history of Anxiety and Diabetes mellitus without complication (HCC). Also  has a past surgical history that includes Vasectomy.  Objective:   Vitals: BP (!) 130/92   Pulse 94   Temp 98.4 F (36.9 C) (Oral)   Resp 18   Ht 5\' 4"  (1.626 m)   Wt 188 lb 6.4 oz (85.5 kg)   SpO2 99%   BMI 32.34 kg/m   Physical Exam  Constitutional: He is oriented to person, place, and time. He appears well-developed and well-nourished.  HENT:  Mucous membranes dry.  TMs intact bilaterally.  There is no mucosal edema or rhinorrhea.  Eyes: Pupils are equal, round, and reactive to light. EOM are normal.  Neck: Normal range of motion. Neck supple. No neck rigidity.  Cardiovascular: Normal rate, regular rhythm and intact distal pulses. Exam reveals no gallop and no friction rub.  No murmur heard. Pulmonary/Chest: No respiratory distress. He has no wheezes.  He has no rales.  Lymphadenopathy:    He has no cervical adenopathy.  Neurological: He is alert and oriented to person, place, and time. He displays normal reflexes. No cranial nerve deficit. Coordination normal.   Assessment and Plan :   Dizzy - Plan: Comprehensive metabolic panel, Orthostatic vital signs, CBC, CANCELED: POCT CBC  Vertigo - Plan: CBC  Alcohol use  Nausea without vomiting  Left-sided headache  Controlled type 2 diabetes mellitus without complication, without long-term current use of insulin (HCC)  I counseled patient against alcohol use is heavy as he did.  I suspect that this is largely the source of his symptoms.  Patient will hydrate aggressively including using Gatorade.  Labs pending.  Follow-up if symptoms persist.  In the meantime he will use meclizine and Zofran for supportive care.  I did provide patient with a prescription for Valtrex in case he did ends up developing shingles rash given his left-sided facial pain.  Isaac Hall , PA-C [facility] [phone] 08/18/2017  5:07 PM

## 2017-08-19 ENCOUNTER — Encounter: Payer: Self-pay | Admitting: Urgent Care

## 2017-08-19 LAB — COMPREHENSIVE METABOLIC PANEL
ALT: 30 IU/L (ref 0–44)
AST: 24 IU/L (ref 0–40)
Albumin/Globulin Ratio: 1.6 (ref 1.2–2.2)
Albumin: 4.5 g/dL (ref 3.5–5.5)
Alkaline Phosphatase: 94 IU/L (ref 39–117)
BUN / CREAT RATIO: 14 (ref 9–20)
BUN: 12 mg/dL (ref 6–24)
Bilirubin Total: <0.2 mg/dL (ref 0.0–1.2)
CO2: 21 mmol/L (ref 20–29)
CREATININE: 0.87 mg/dL (ref 0.76–1.27)
Calcium: 9.6 mg/dL (ref 8.7–10.2)
Chloride: 103 mmol/L (ref 96–106)
GFR calc non Af Amer: 103 mL/min/{1.73_m2} (ref >59)
GFR, EST AFRICAN AMERICAN: 119 mL/min/{1.73_m2} (ref >59)
GLUCOSE: 160 mg/dL — AB (ref 65–99)
Globulin, Total: 2.8 g/dL (ref 1.5–4.5)
Potassium: 4 mmol/L (ref 3.5–5.2)
Sodium: 139 mmol/L (ref 134–144)
TOTAL PROTEIN: 7.3 g/dL (ref 6.0–8.5)

## 2017-08-19 LAB — CBC
HEMATOCRIT: 43.2 % (ref 37.5–51.0)
HEMOGLOBIN: 14.7 g/dL (ref 13.0–17.7)
MCH: 30.1 pg (ref 26.6–33.0)
MCHC: 34 g/dL (ref 31.5–35.7)
MCV: 88 fL (ref 79–97)
Platelets: 251 10*3/uL (ref 150–450)
RBC: 4.89 x10E6/uL (ref 4.14–5.80)
RDW: 13.7 % (ref 12.3–15.4)
WBC: 9.2 10*3/uL (ref 3.4–10.8)

## 2017-08-21 ENCOUNTER — Ambulatory Visit: Payer: BLUE CROSS/BLUE SHIELD | Admitting: Urgent Care

## 2017-09-29 DIAGNOSIS — E119 Type 2 diabetes mellitus without complications: Secondary | ICD-10-CM | POA: Diagnosis not present

## 2017-09-29 DIAGNOSIS — H11001 Unspecified pterygium of right eye: Secondary | ICD-10-CM | POA: Diagnosis not present

## 2017-09-29 DIAGNOSIS — H5212 Myopia, left eye: Secondary | ICD-10-CM | POA: Diagnosis not present

## 2017-09-30 LAB — HM DIABETES EYE EXAM

## 2018-03-19 ENCOUNTER — Other Ambulatory Visit: Payer: Self-pay | Admitting: Urgent Care

## 2018-03-19 DIAGNOSIS — F418 Other specified anxiety disorders: Secondary | ICD-10-CM

## 2018-05-22 ENCOUNTER — Ambulatory Visit (INDEPENDENT_AMBULATORY_CARE_PROVIDER_SITE_OTHER): Payer: BLUE CROSS/BLUE SHIELD | Admitting: Family Medicine

## 2018-05-22 ENCOUNTER — Encounter: Payer: Self-pay | Admitting: Family Medicine

## 2018-05-22 ENCOUNTER — Other Ambulatory Visit: Payer: Self-pay

## 2018-05-22 VITALS — BP 120/77 | HR 103 | Temp 98.6°F | Resp 16 | Ht 64.0 in | Wt 186.2 lb

## 2018-05-22 DIAGNOSIS — R509 Fever, unspecified: Secondary | ICD-10-CM

## 2018-05-22 DIAGNOSIS — J101 Influenza due to other identified influenza virus with other respiratory manifestations: Secondary | ICD-10-CM | POA: Diagnosis not present

## 2018-05-22 DIAGNOSIS — J029 Acute pharyngitis, unspecified: Secondary | ICD-10-CM | POA: Diagnosis not present

## 2018-05-22 LAB — POCT INFLUENZA A/B
INFLUENZA A, POC: POSITIVE — AB
INFLUENZA B, POC: NEGATIVE

## 2018-05-22 LAB — POCT RAPID STREP A (OFFICE): Rapid Strep A Screen: NEGATIVE

## 2018-05-22 MED ORDER — OSELTAMIVIR PHOSPHATE 75 MG PO CAPS: 75.0000 mg | ORAL_CAPSULE | Freq: Two times a day (BID) | ORAL | 0 refills | Status: DC

## 2018-05-22 NOTE — Progress Notes (Signed)
Subjective:    Patient ID: Isaac Hall, male    DOB: [birth year], 48 y.o.   MRN: [id number]  HPI Isaac Hall is a 48 y.o. male Presents today for: Chief Complaint  Patient presents with  . Generalized Body Aches    cough and sorethroat started 05/20/18 then the fever and bodyache started 04/22/18   Started 2 nights ago - sore throat, cough.  Bodyaches, chills, fever yesterday. Hurts all over.   Able to drink fluids.   No asthma/COPD. No chronic lung problems.  Has diabetes.   Tx: ibuprofen, tylenol.    No flu vaccine this season.   There are no active problems to display for this patient.  Past Medical History:  Diagnosis Date  . Anxiety   . Diabetes mellitus without complication [facility])    Past Surgical History:  Procedure Laterality Date  . VASECTOMY     No Known Allergies Prior to Admission medications   Medication Sig Start Date End Date Taking? Authorizing Provider  albuterol (PROVENTIL HFA;VENTOLIN HFA) 108 (90 Base) MCG/ACT inhaler Inhale 2 puffs into the lungs every 6 (six) hours as needed for wheezing or shortness of breath. 08/04/17   Weber, Dema Severin, PA-C  atorvastatin (LIPITOR) 10 MG tablet Take 1 tablet (10 mg total) by mouth daily. 07/29/17   Wallis Bamberg, PA-C  citalopram (CELEXA) 20 MG tablet TAKE ONE (1) TABLET BY MOUTH EVERY DAY 03/23/18   Collie Siad A, MD  lisinopril (PRINIVIL,ZESTRIL) 5 MG tablet Take 1 tablet (5 mg total) by mouth daily. 07/29/17   Wallis Bamberg, PA-C  meclizine (ANTIVERT) 25 MG tablet Take 1 tablet (25 mg total) by mouth 3 (three) times daily as needed for dizziness. 08/18/17   Wallis Bamberg, PA-C  metFORMIN (GLUCOPHAGE) 500 MG tablet Take 1 tablet (500 mg total) by mouth 2 (two) times daily with a meal. 07/29/17   Wallis Bamberg, PA-C  ondansetron (ZOFRAN-ODT) 8 MG disintegrating tablet Take 1 tablet (8 mg total) by mouth every 8 (eight) hours as needed for nausea. 08/18/17   Wallis Bamberg, PA-C  ONETOUCH DELICA LANCETS 33G MISC   08/01/17   [provider]  valACYclovir (VALTREX) 1000 MG tablet Take 1 tablet (1,000 mg total) by mouth 3 (three) times daily. 08/18/17   Wallis Bamberg, PA-C   Social History   Socioeconomic History  . Marital status: Married    Spouse name: Mary  . Number of children: 2  . Years of education: 12th grade  . Highest education level: Not on file  Occupational History  . Occupation: MACHINE OPERATOR    Employer: [employer]  Social Needs  . Financial resource strain: Not on file  . Food insecurity:    Worry: Not on file    Inability: Not on file  . Transportation needs:    Medical: Not on file    Non-medical: Not on file  Tobacco Use  . Smoking status: Never Smoker  . Smokeless tobacco: Never Used  Substance and Sexual Activity  . Alcohol use: No  . Drug use: No  . Sexual activity: Not on file  Lifestyle  . Physical activity:    Days per week: Not on file    Minutes per session: Not on file  . Stress: Not on file  Relationships  . Social connections:    Talks on phone: Not on file    Gets together: Not on file    Attends religious service: Not on file    Active member of club or  organization: Not on file    Attends meetings of clubs or organizations: Not on file    Relationship status: Not on file  . Intimate partner violence:    Fear of current or ex partner: Not on file    Emotionally abused: Not on file    Physically abused: Not on file    Forced sexual activity: Not on file  Other Topics Concern  . Not on file  Social History Narrative   Lives with his wife and their 2 children.   Originally from [place].  Came to the [place] in 1995.    Review of Systems Per HPI    Objective:   Physical Exam Vitals signs reviewed.  Constitutional:      Appearance: He is well-developed.  HENT:     Head: Normocephalic and atraumatic.     Right Ear: Tympanic membrane, ear canal and external ear normal.     Left Ear: Tympanic membrane, ear canal and external ear  normal.     Nose: No rhinorrhea.     Mouth/Throat:     Pharynx: No oropharyngeal exudate or posterior oropharyngeal erythema.  Eyes:     Conjunctiva/sclera: Conjunctivae normal.     Pupils: Pupils are equal, round, and reactive to light.  Neck:     Musculoskeletal: Neck supple.  Cardiovascular:     Rate and Rhythm: Normal rate and regular rhythm.     Heart sounds: Normal heart sounds. No murmur.  Pulmonary:     Effort: Pulmonary effort is normal.     Breath sounds: Normal breath sounds. No wheezing, rhonchi or rales.  Abdominal:     Palpations: Abdomen is soft.     Tenderness: There is no abdominal tenderness.  Lymphadenopathy:     Cervical: No cervical adenopathy.  Skin:    General: Skin is warm and dry.     Findings: No rash.  Neurological:     Mental Status: He is alert and oriented to person, place, and time.  Psychiatric:        Behavior: Behavior normal.    Vitals:   05/22/18 1708  BP: 120/77  Pulse: (!) 103  Resp: 16  Temp: 98.6 F (37 C)  TempSrc: Oral  SpO2: 98%  Weight: 186 lb 3.2 oz (84.5 kg)  Height: 5\' 4"  (1.626 m)      Results for orders placed or performed in visit on 05/22/18  POCT Influenza A/B  Result Value Ref Range   Influenza A, POC Positive (A) Negative   Influenza B, POC Negative Negative  POCT rapid strep A  Result Value Ref Range   Rapid Strep A Screen Negative Negative       Assessment & Plan:    Isaac Hall is a 48 y.o. male Fever, unspecified - Plan: POCT Influenza A/B  Sorethroat - Plan: POCT rapid strep A  Influenza A - Plan: oseltamivir (TAMIFLU) 75 MG capsule  Influenza A.  Nontoxic exam, vitals reassuring.  Tamiflu discussed based on timing of symptoms.  Did request Tamiflu, prescription given with potential side effects discussed.  Other symptomatic care discussed, antipyretics, and avoidance of work until fever free for 24 hours off antipyretics.  Understanding expressed.  RTC precautions/ER precautions  given  Meds ordered this encounter  Medications  . oseltamivir (TAMIFLU) 75 MG capsule    Sig: Take 1 capsule (75 mg total) by mouth 2 (two) times daily.    Dispense:  10 capsule    Refill:  0   Patient Instructions  Can start Tamiflu as discussed. Saline nasal spray at least 4 times per day if needed for nasal congestion, over the counter mucinex or mucinex DM as needed for cough, tylenol or ibuprofen over the counter for fever and body aches, and drink plenty of fluids. Other information as in instructions below.  Return to the clinic or go to the nearest emergency room if any of your symptoms worsen or new symptoms occur.   Influenza, Adult Influenza, more commonly known as "the flu," is a viral infection that mainly affects the respiratory tract. The respiratory tract includes organs that help you breathe, such as the lungs, nose, and throat. The flu causes many symptoms similar to the common cold along with high fever and body aches. The flu spreads easily from person to person (is contagious). Getting a flu shot (influenza vaccination) every year is the best way to prevent the flu. What are the causes? This condition is caused by the influenza virus. You can get the virus by:  Breathing in droplets that are in the air from an infected person's cough or sneeze.  Touching something that has been exposed to the virus (has been contaminated) and then touching your mouth, nose, or eyes. What increases the risk? The following factors may make you more likely to get the flu:  Not washing or sanitizing your hands often.  Having close contact with many people during cold and flu season.  Touching your mouth, eyes, or nose without first washing or sanitizing your hands.  Not getting a yearly (annual) flu shot. You may have a higher risk for the flu, including serious problems such as a lung infection (pneumonia), if you:  Are older than 65.  Are pregnant.  Have a weakened  disease-fighting system (immune system). You may have a weakened immune system if you: ? Have HIV or AIDS. ? Are undergoing chemotherapy. ? Are taking medicines that reduce (suppress) the activity of your immune system.  Have a long-term (chronic) illness, such as heart disease, kidney disease, diabetes, or lung disease.  Have a liver disorder.  Are severely overweight (morbidly obese).  Have anemia. This is a condition that affects your red blood cells.  Have asthma. What are the signs or symptoms? Symptoms of this condition usually begin suddenly and last 4-14 days. They may include:  Fever and chills.  Headaches, body aches, or muscle aches.  Sore throat.  Cough.  Runny or stuffy (congested) nose.  Chest discomfort.  Poor appetite.  Weakness or fatigue.  Dizziness.  Nausea or vomiting. How is this diagnosed? This condition may be diagnosed based on:  Your symptoms and medical history.  A physical exam.  Swabbing your nose or throat and testing the fluid for the influenza virus. How is this treated? If the flu is diagnosed early, you can be treated with medicine that can help reduce how severe the illness is and how long it lasts (antiviral medicine). This may be given by mouth (orally) or through an IV. Taking care of yourself at home can help relieve symptoms. Your health care provider may recommend:  Taking over-the-counter medicines.  Drinking plenty of fluids. In many cases, the flu goes away on its own. If you have severe symptoms or complications, you may be treated in a hospital. Follow these instructions at home: Activity  Rest as needed and get plenty of sleep.  Stay home from work or school as told by your health care provider. Unless you are visiting your health care provider,  avoid leaving home until your fever has been gone for 24 hours without taking medicine. Eating and drinking  Take an oral rehydration solution (ORS). This is a drink  that is sold at pharmacies and retail stores.  Drink enough fluid to keep your urine pale yellow.  Drink clear fluids in small amounts as you are able. Clear fluids include water, ice chips, diluted fruit juice, and low-calorie sports drinks.  Eat bland, easy-to-digest foods in small amounts as you are able. These foods include bananas, applesauce, rice, lean meats, toast, and crackers.  Avoid drinking fluids that contain a lot of sugar or caffeine, such as energy drinks, regular sports drinks, and soda.  Avoid alcohol.  Avoid spicy or fatty foods. General instructions      Take over-the-counter and prescription medicines only as told by your health care provider.  Use a cool mist humidifier to add humidity to the air in your home. This can make it easier to breathe.  Cover your mouth and nose when you cough or sneeze.  Wash your hands with soap and water often, especially after you cough or sneeze. If soap and water are not available, use alcohol-based hand sanitizer.  Keep all follow-up visits as told by your health care provider. This is important. How is this prevented?   Get an annual flu shot. You may get the flu shot in late summer, fall, or winter. Ask your health care provider when you should get your flu shot.  Avoid contact with people who are sick during cold and flu season. This is generally fall and winter. Contact a health care provider if:  You develop new symptoms.  You have: ? Chest pain. ? Diarrhea. ? A fever.  Your cough gets worse.  You produce more mucus.  You feel nauseous or you vomit. Get help right away if:  You develop shortness of breath or difficulty breathing.  Your skin or nails turn a bluish color.  You have severe pain or stiffness in your neck.  You develop a sudden headache or sudden pain in your face or ear.  You cannot eat or drink without vomiting. Summary  Influenza, more commonly known as "the flu," is a viral  infection that primarily affects your respiratory tract.  Symptoms of the flu usually begin suddenly and last 4-14 days.  Getting an annual flu shot is the best way to prevent getting the flu.  Stay home from work or school as told by your health care provider. Unless you are visiting your health care provider, avoid leaving home until your fever has been gone for 24 hours without taking medicine.  Keep all follow-up visits as told by your health care provider. This is important. This information is not intended to replace advice given to you by your health care provider. Make sure you discuss any questions you have with your health care provider. Document Released: 03/01/2000 Document Revised: 08/20/2017 Document Reviewed: 08/20/2017 Elsevier Interactive Patient Education  [employer].    If you have lab work done today you will be contacted with your lab results within the next 2 weeks.  If you have not heard from [place] then please contact us. The fastest way to get your results is to register for My Chart.   IF you received an x-ray today, you will receive an invoice from [facility]. Please contact [facility] at [phone] with questions or concerns regarding your invoice.   IF you received labwork today, you will receive an  invoice from [place]. Please contact [facility] at [phone] with questions or concerns regarding your invoice.   Our billing staff will not be able to assist you with questions regarding bills from these companies.  You will be contacted with the lab results as soon as they are available. The fastest way to get your results is to activate your My Chart account. Instructions are located on the last page of this paperwork. If you have not heard from [place] regarding the results in 2 weeks, please contact this office.       Signed,   Meredith Staggers, MD [facility] at [facility].  05/24/18 9:45 PM

## 2018-05-22 NOTE — Patient Instructions (Addendum)
Can start Tamiflu as discussed. Saline nasal spray at least 4 times per day if needed for nasal congestion, over the counter mucinex or mucinex DM as needed for cough, tylenol or ibuprofen over the counter for fever and body aches, and drink plenty of fluids. Other information as in instructions below.  Return to the clinic or go to the nearest emergency room if any of your symptoms worsen or new symptoms occur.   Influenza, Adult Influenza, more commonly known as "the flu," is a viral infection that mainly affects the respiratory tract. The respiratory tract includes organs that help you breathe, such as the lungs, nose, and throat. The flu causes many symptoms similar to the common cold along with high fever and body aches. The flu spreads easily from person to person (is contagious). Getting a flu shot (influenza vaccination) every year is the best way to prevent the flu. What are the causes? This condition is caused by the influenza virus. You can get the virus by:  Breathing in droplets that are in the air from an infected person's cough or sneeze.  Touching something that has been exposed to the virus (has been contaminated) and then touching your mouth, nose, or eyes. What increases the risk? The following factors may make you more likely to get the flu:  Not washing or sanitizing your hands often.  Having close contact with many people during cold and flu season.  Touching your mouth, eyes, or nose without first washing or sanitizing your hands.  Not getting a yearly (annual) flu shot. You may have a higher risk for the flu, including serious problems such as a lung infection (pneumonia), if you:  Are older than 65.  Are pregnant.  Have a weakened disease-fighting system (immune system). You may have a weakened immune system if you: ? Have HIV or AIDS. ? Are undergoing chemotherapy. ? Are taking medicines that reduce (suppress) the activity of your immune system.  Have a  long-term (chronic) illness, such as heart disease, kidney disease, diabetes, or lung disease.  Have a liver disorder.  Are severely overweight (morbidly obese).  Have anemia. This is a condition that affects your red blood cells.  Have asthma. What are the signs or symptoms? Symptoms of this condition usually begin suddenly and last 4-14 days. They may include:  Fever and chills.  Headaches, body aches, or muscle aches.  Sore throat.  Cough.  Runny or stuffy (congested) nose.  Chest discomfort.  Poor appetite.  Weakness or fatigue.  Dizziness.  Nausea or vomiting. How is this diagnosed? This condition may be diagnosed based on:  Your symptoms and medical history.  A physical exam.  Swabbing your nose or throat and testing the fluid for the influenza virus. How is this treated? If the flu is diagnosed early, you can be treated with medicine that can help reduce how severe the illness is and how long it lasts (antiviral medicine). This may be given by mouth (orally) or through an IV. Taking care of yourself at home can help relieve symptoms. Your health care provider may recommend:  Taking over-the-counter medicines.  Drinking plenty of fluids. In many cases, the flu goes away on its own. If you have severe symptoms or complications, you may be treated in a hospital. Follow these instructions at home: Activity  Rest as needed and get plenty of sleep.  Stay home from work or school as told by your health care provider. Unless you are visiting your health care provider,  avoid leaving home until your fever has been gone for 24 hours without taking medicine. Eating and drinking  Take an oral rehydration solution (ORS). This is a drink that is sold at pharmacies and retail stores.  Drink enough fluid to keep your urine pale yellow.  Drink clear fluids in small amounts as you are able. Clear fluids include water, ice chips, diluted fruit juice, and low-calorie  sports drinks.  Eat bland, easy-to-digest foods in small amounts as you are able. These foods include bananas, applesauce, rice, lean meats, toast, and crackers.  Avoid drinking fluids that contain a lot of sugar or caffeine, such as energy drinks, regular sports drinks, and soda.  Avoid alcohol.  Avoid spicy or fatty foods. General instructions      Take over-the-counter and prescription medicines only as told by your health care provider.  Use a cool mist humidifier to add humidity to the air in your home. This can make it easier to breathe.  Cover your mouth and nose when you cough or sneeze.  Wash your hands with soap and water often, especially after you cough or sneeze. If soap and water are not available, use alcohol-based hand sanitizer.  Keep all follow-up visits as told by your health care provider. This is important. How is this prevented?   Get an annual flu shot. You may get the flu shot in late summer, fall, or winter. Ask your health care provider when you should get your flu shot.  Avoid contact with people who are sick during cold and flu season. This is generally fall and winter. Contact a health care provider if:  You develop new symptoms.  You have: ? Chest pain. ? Diarrhea. ? A fever.  Your cough gets worse.  You produce more mucus.  You feel nauseous or you vomit. Get help right away if:  You develop shortness of breath or difficulty breathing.  Your skin or nails turn a bluish color.  You have severe pain or stiffness in your neck.  You develop a sudden headache or sudden pain in your face or ear.  You cannot eat or drink without vomiting. Summary  Influenza, more commonly known as "the flu," is a viral infection that primarily affects your respiratory tract.  Symptoms of the flu usually begin suddenly and last 4-14 days.  Getting an annual flu shot is the best way to prevent getting the flu.  Stay home from work or school as told  by your health care provider. Unless you are visiting your health care provider, avoid leaving home until your fever has been gone for 24 hours without taking medicine.  Keep all follow-up visits as told by your health care provider. This is important. This information is not intended to replace advice given to you by your health care provider. Make sure you discuss any questions you have with your health care provider. Document Released: 03/01/2000 Document Revised: 08/20/2017 Document Reviewed: 08/20/2017 Elsevier Interactive Patient Education  [employer].    If you have lab work done today you will be contacted with your lab results within the next 2 weeks.  If you have not heard from [place] then please contact us. The fastest way to get your results is to register for My Chart.   IF you received an x-ray today, you will receive an invoice from [facility]. Please contact [facility] at [phone] with questions or concerns regarding your invoice.   IF you received labwork today, you will receive an  invoice from [place]. Please contact [facility] at [phone] with questions or concerns regarding your invoice.   Our billing staff will not be able to assist you with questions regarding bills from these companies.  You will be contacted with the lab results as soon as they are available. The fastest way to get your results is to activate your My Chart account. Instructions are located on the last page of this paperwork. If you have not heard from [place] regarding the results in 2 weeks, please contact this office.

## 2018-05-24 ENCOUNTER — Encounter: Payer: Self-pay | Admitting: Family Medicine

## 2018-05-26 IMAGING — DX DG FINGER LITTLE 2+V*R*
3 series · 3 of 3 positions shown · non-contrast
Comparison: None.

CLINICAL DATA: Pain and swelling of the right fifth finger.

EXAM:
RIGHT LITTLE FINGER 2+V

[finger ap]
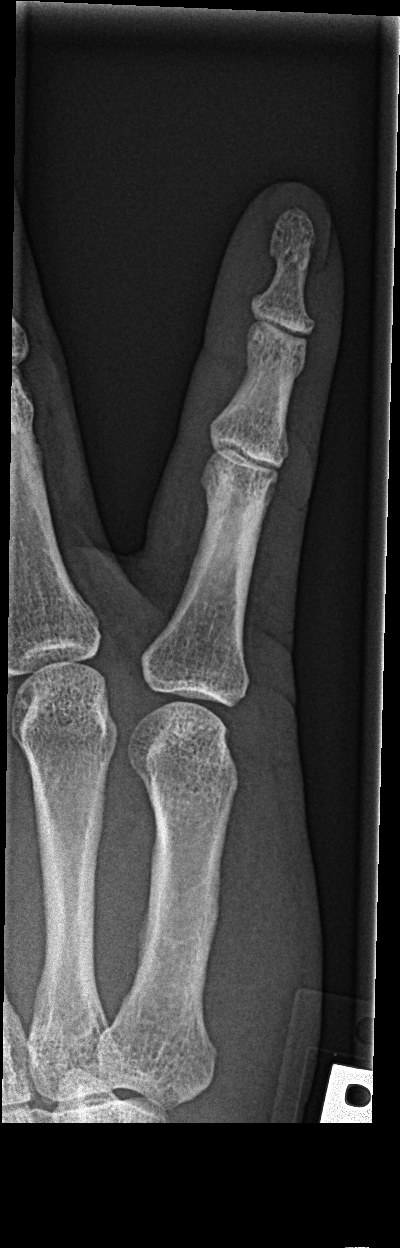

[finger obl]
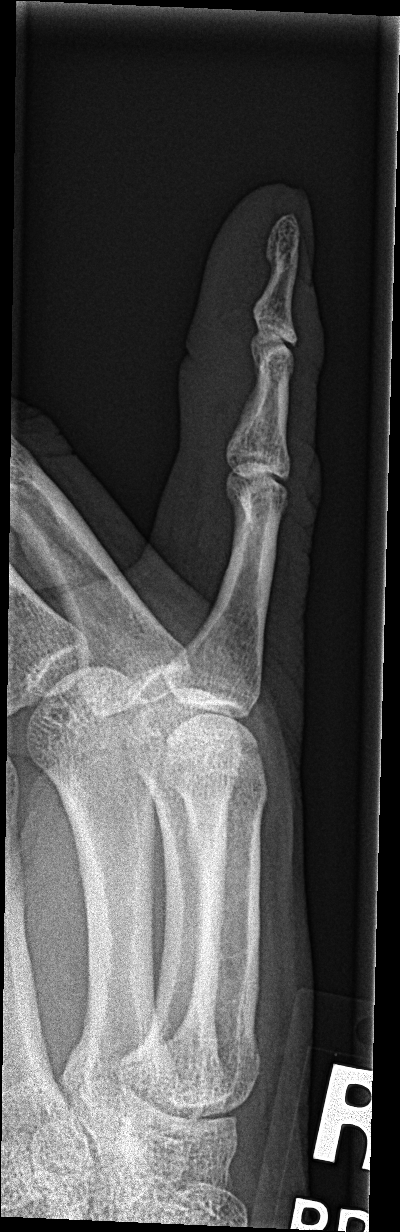

[finger lat]
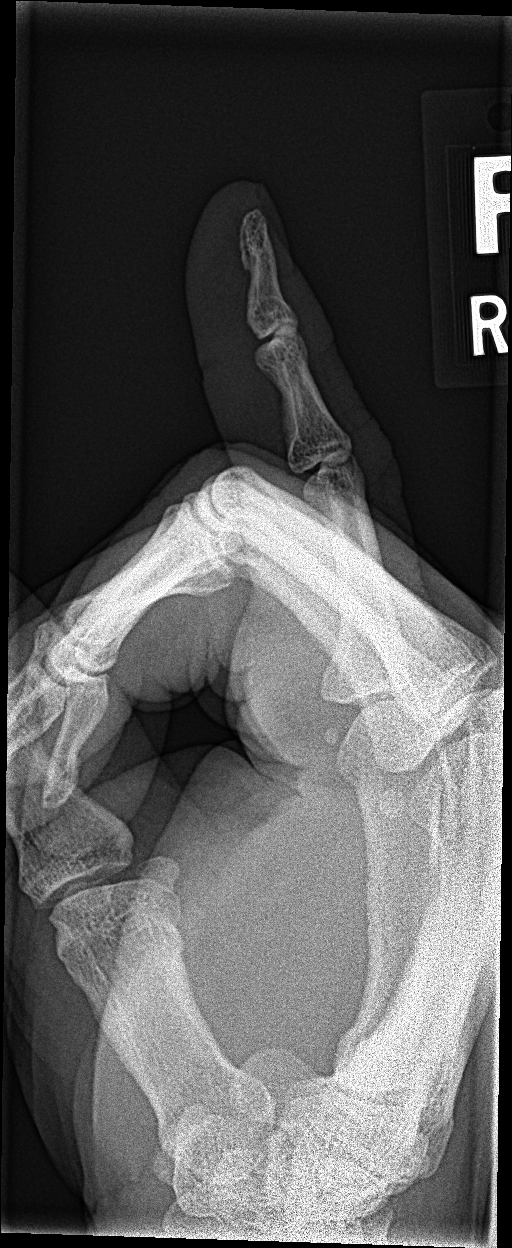

[3 of 3 positions shown; findings below may reference images not displayed]

FINDINGS: The joint spaces are maintained. No acute bony findings or
destructive bony changes. No definite plain film findings for septic
arthritis. Mild diffuse soft tissue swelling.
IMPRESSION: No acute bony findings.

## 2018-06-22 ENCOUNTER — Other Ambulatory Visit: Payer: Self-pay | Admitting: Family Medicine

## 2018-06-22 DIAGNOSIS — F418 Other specified anxiety disorders: Secondary | ICD-10-CM

## 2018-08-14 DIAGNOSIS — R51 Headache: Secondary | ICD-10-CM | POA: Diagnosis not present

## 2018-08-14 DIAGNOSIS — Z20828 Contact with and (suspected) exposure to other viral communicable diseases: Secondary | ICD-10-CM | POA: Diagnosis not present

## 2018-08-17 DIAGNOSIS — M79606 Pain in leg, unspecified: Secondary | ICD-10-CM

## 2018-08-17 HISTORY — DX: Pain in leg, unspecified: M79.606

## 2018-09-15 ENCOUNTER — Encounter: Payer: Self-pay | Admitting: Emergency Medicine

## 2018-09-15 ENCOUNTER — Other Ambulatory Visit: Payer: Self-pay

## 2018-09-15 ENCOUNTER — Ambulatory Visit (INDEPENDENT_AMBULATORY_CARE_PROVIDER_SITE_OTHER): Payer: BC Managed Care – PPO | Admitting: Emergency Medicine

## 2018-09-15 VITALS — BP 125/78 | HR 81 | Temp 98.8°F | Resp 16 | Ht 64.0 in | Wt 188.6 lb

## 2018-09-15 DIAGNOSIS — M5432 Sciatica, left side: Secondary | ICD-10-CM | POA: Diagnosis not present

## 2018-09-15 DIAGNOSIS — Z7689 Persons encountering health services in other specified circumstances: Secondary | ICD-10-CM

## 2018-09-15 DIAGNOSIS — E119 Type 2 diabetes mellitus without complications: Secondary | ICD-10-CM | POA: Diagnosis not present

## 2018-09-15 DIAGNOSIS — F418 Other specified anxiety disorders: Secondary | ICD-10-CM | POA: Diagnosis not present

## 2018-09-15 DIAGNOSIS — E786 Lipoprotein deficiency: Secondary | ICD-10-CM | POA: Diagnosis not present

## 2018-09-15 MED ORDER — LISINOPRIL 5 MG PO TABS: 5.0000 mg | ORAL_TABLET | Freq: Every day | ORAL | 3 refills | Status: DC

## 2018-09-15 MED ORDER — CITALOPRAM HYDROBROMIDE 20 MG PO TABS: 20.0000 mg | ORAL_TABLET | Freq: Every day | ORAL | 3 refills | Status: DC

## 2018-09-15 MED ORDER — ATORVASTATIN CALCIUM 10 MG PO TABS: 10.0000 mg | ORAL_TABLET | Freq: Every day | ORAL | 3 refills | Status: DC

## 2018-09-15 MED ORDER — CYCLOBENZAPRINE HCL 10 MG PO TABS: 10.0000 mg | ORAL_TABLET | Freq: Every day | ORAL | 0 refills | Status: DC

## 2018-09-15 MED ORDER — MELOXICAM 15 MG PO TABS: 15.0000 mg | ORAL_TABLET | Freq: Every day | ORAL | 0 refills | Status: DC

## 2018-09-15 NOTE — Patient Instructions (Addendum)
   If you have lab work done today you will be contacted with your lab results within the next 2 weeks.  If you have not heard from us then please contact us. The fastest way to get your results is to register for My Chart.   IF you received an x-ray today, you will receive an invoice from [facility]. Please contact [facility] at [phone] with questions or concerns regarding your invoice.   IF you received labwork today, you will receive an invoice from [facility]. Please contact [facility] at [phone] with questions or concerns regarding your invoice.   Our billing staff will not be able to assist you with questions regarding bills from these companies.  You will be contacted with the lab results as soon as they are available. The fastest way to get your results is to activate your My Chart account. Instructions are located on the last page of this paperwork. If you have not heard from us regarding the results in 2 weeks, please contact this office.     Diabetes mellitus y nutricin, en adultos Diabetes Mellitus and Nutrition, Adult Si sufre de diabetes (diabetes mellitus), es muy importante tener hbitos alimenticios saludables debido a que sus niveles de azcar en la sangre (glucosa) se ven afectados en gran medida por lo que come y bebe. Comer alimentos saludables en las cantidades adecuadas, aproximadamente a la misma hora todos los das, lo ayudar a:  Controlar la glucemia.  Disminuir el riesgo de sufrir una enfermedad cardaca.  Mejorar la presin arterial.  Alcanzar o mantener un peso saludable. Todas las personas que sufren de diabetes son diferentes y cada una tiene necesidades diferentes en cuanto a un plan de alimentacin. El mdico puede recomendarle que trabaje con un especialista en dietas y nutricin (nutricionista) para elaborar el mejor plan para usted. Su plan de alimentacin puede variar segn factores como:  Las caloras que  necesita.  Los medicamentos que toma.  Su peso.  Sus niveles de glucemia, presin arterial y colesterol.  Su nivel de actividad.  Otras afecciones que tenga, como enfermedades cardacas o renales. Cmo me afectan los carbohidratos? Los carbohidratos, o hidratos de carbono, afectan su nivel de glucemia ms que cualquier otro tipo de alimento. La ingesta de carbohidratos naturalmente aumenta la cantidad de glucosa en la sangre. El recuento de carbohidratos es un mtodo destinado a llevar un registro de la cantidad de carbohidratos que se consumen. El recuento de carbohidratos es importante para mantener la glucemia a un nivel saludable, especialmente si utiliza insulina o toma determinados medicamentos por va oral para la diabetes. Es importante conocer la cantidad de carbohidratos que se pueden ingerir en cada comida sin correr ningn riesgo. Esto es diferente en cada persona. Su nutricionista puede ayudarlo a calcular la cantidad de carbohidratos que debe ingerir en cada comida y en cada refrigerio. Entre los alimentos que contienen carbohidratos, se incluyen:  Pan, cereal, arroz, pastas y galletas.  Papas y maz.  Guisantes, frijoles y lentejas.  Leche y yogur.  Frutas y jugo.  Postres, como pasteles, galletas, helado y caramelos. Cmo me afecta el alcohol? El alcohol puede provocar disminuciones sbitas de la glucemia (hipoglucemia), especialmente si utiliza insulina o toma determinados medicamentos por va oral para la diabetes. La hipoglucemia es una afeccin potencialmente mortal. Los sntomas de la hipoglucemia (somnolencia, mareos y confusin) son similares a los sntomas de haber consumido demasiado alcohol. Si el mdico afirma que el alcohol es seguro para usted, siga estas pautas:    Limite el consumo de alcohol a no ms de 1medida por da si es mujer y no est embarazada, y a 2medidas si es hombre. Una medida equivale a 12oz (355ml) de cerveza, 5oz (148ml) de vino o  1oz (44ml) de bebidas alcohlicas de alta graduacin.  No beba con el estmago vaco.  Mantngase hidratado bebiendo agua, refrescos dietticos o t helado sin azcar.  Tenga en cuenta que los refrescos comunes, los jugos y otras bebida para mezclar pueden contener mucha azcar y se deben contar como carbohidratos. Cules son algunos consejos para seguir este plan?  Leer las etiquetas de los alimentos  Comience por leer el tamao de la porcin en la "Informacin nutricional" en las etiquetas de los alimentos envasados y las bebidas. La cantidad de caloras, carbohidratos, grasas y otros nutrientes mencionados en la etiqueta se basan en una porcin del alimento. Muchos alimentos contienen ms de una porcin por envase.  Verifique la cantidad total de gramos (g) de carbohidratos totales en una porcin. Puede calcular la cantidad de porciones de carbohidratos al dividir el total de carbohidratos por 15. Por ejemplo, si un alimento tiene un total de 30g de carbohidratos, equivale a 2 porciones de carbohidratos.  Verifique la cantidad de gramos (g) de grasas saturadas y grasas trans en una porcin. Escoja alimentos que no contengan grasa o que tengan un bajo contenido.  Verifique la cantidad de miligramos (mg) de sal (sodio) en una porcin. La mayora de las personas deben limitar la ingesta de sodio total a menos de 2300mg por da.  Siempre consulte la informacin nutricional de los alimentos etiquetados como "con bajo contenido de grasa" o "sin grasa". Estos alimentos pueden tener un mayor contenido de azcar agregada o carbohidratos refinados, y deben evitarse.  Hable con su nutricionista para identificar sus objetivos diarios en cuanto a los nutrientes mencionados en la etiqueta. Al ir de compras  Evite comprar alimentos procesados, enlatados o precocinados. Estos alimentos tienden a tener una mayor cantidad de grasa, sodio y azcar agregada.  Compre en la zona exterior de la tienda de  comestibles. Esta zona incluye frutas y verduras frescas, granos a granel, carnes frescas y productos lcteos frescos. Al cocinar  Utilice mtodos de coccin a baja temperatura, como hornear, en lugar de mtodos de coccin a alta temperatura, como frer en abundante aceite.  Cocine con aceites saludables, como el aceite de oliva, canola o girasol.  Evite cocinar con manteca, crema o carnes con alto contenido de grasa. Planificacin de las comidas  Coma las comidas y los refrigerios regularmente, preferentemente a la misma hora todos los das. Evite pasar largos perodos de tiempo sin comer.  Consuma alimentos ricos en fibra, como frutas frescas, verduras, frijoles y cereales integrales. Consulte a su nutricionista sobre cuntas porciones de carbohidratos puede consumir en cada comida.  Consuma entre 4 y 6 onzas (oz) de protenas magras por da, como carnes magras, pollo, pescado, huevos o tofu. Una onza de protena magra equivale a: ? 1 onza de carne, pollo o pescado. ? 1huevo. ?  taza de tofu.  Coma algunos alimentos por da que contengan grasas saludables, como aguacates, frutos secos, semillas y pescado. Estilo de vida  Controle su nivel de glucemia con regularidad.  Haga actividad fsica habitualmente como se lo haya indicado el mdico. Esto puede incluir lo siguiente: ? 150minutos semanales de ejercicio de intensidad moderada o alta. Esto podra incluir caminatas dinmicas, ciclismo o gimnasia acutica. ? Realizar ejercicios de elongacin y de fortalecimiento, como yoga o levantamiento   de pesas, por lo menos 2veces por semana.  Tome los Monsanto Companymedicamentos como se lo haya indicado el mdico.  No consuma ningn producto que contenga nicotina o tabaco, como cigarrillos y Administrator, Civil Servicecigarrillos electrnicos. Si necesita ayuda para dejar de fumar, consulte al CIGNAmdico.  Trabaje con un asesor o instructor en diabetes para identificar estrategias para controlar el estrs y cualquier desafo emocional  y social. Preguntas para hacerle al mdico  Es necesario que consulte a IT trainerun instructor en el cuidado de la diabetes?  Es necesario que me rena con un nutricionista?  A qu nmero puedo llamar si tengo preguntas?  Cules son los mejores momentos para controlar la glucemia? Dnde encontrar ms informacin:  Asociacin Estadounidense de la Diabetes (American Diabetes Association): diabetes.org  Academia de Nutricin y Pension scheme managerDiettica (Academy of Nutrition and Dietetics): www.eatright.org  The Krogernstituto Nacional de la Diabetes y las Enfermedades Digestivas y [facility](National Institute of Diabetes and Digestive and Kidney Diseases, NIH): CarFlippers.tnwww.niddk.nih.gov Resumen  Un plan de alimentacin saludable lo ayudar a Scientist, physiologicalcontrolar la glucemia y Pharmacologistmantener un estilo de vida saludable.  Trabajar con un especialista en dietas y nutricin (nutricionista) puede ayudarlo a Designer, television/film setelaborar el mejor plan de alimentacin para usted.  Tenga en cuenta que los carbohidratos (hidratos de carbono) y el alcohol tienen efectos inmediatos en sus niveles de glucemia. Es importante contar los carbohidratos que ingiere y consumir alcohol con prudencia. Esta informacin no tiene Theme park managercomo fin reemplazar el consejo del mdico. Asegrese de hacerle al mdico cualquier pregunta que tenga. Document Released: 06/11/2007 Document Revised: 11/12/2016 Document Reviewed: 06/24/2016 Elsevier Patient Education  2020 Elsevier Inc.  Engineer, agriculturalCitica Sciatica  La citica es el dolor, debilidad, hormigueo o prdida de la sensibilidad (adormecimiento) a lo largo del nervio citico. El nervio citico comienza en la parte inferior de la espalda y desciende por la parte posterior de cada pierna. Suele desaparecer por s sola o con tratamiento. A veces, la citica puede volver a aparecer (ser recurrente). Cules son las causas? Esta afeccin se produce cuando el nervio citico se comprime o se ejerce presin sobre l. Esto puede ser el resultado de:  Un disco que  sobresale demasiado entre los huesos de la columna vertebral (hernia de disco).  Los cambios que se producen Teachers Insurance and Annuity Associationen los discos vertebrales al Secretary/administratorenvejecer.  Una afeccin en un msculo de las nalgas.  Un crecimiento seo adicional cerca del nervio citico.  Una rotura (fractura) de la zona que est entre los huesos de la cadera (pelvis).  Embarazo.  Tumor. Esto es poco frecuente. Qu incrementa el riesgo? Es ms probable que tengan esta afeccin las personas que:  Software engineerractican deportes que ponen presin o tensin sobre la columna vertebral.  Tienen poca fuerza y facilidad de movimiento (flexibilidad).  Han tenido una lesin en la espalda en el pasado.  Han tenido una ciruga en la espalda.  Permanecen sentadas durante largos perodos.  Realizan actividades que implican agacharse o levantar objetos una y Floralotra vez.  Tienen mucho sobrepeso (es obeso). Cules son los signos o los sntomas? Los sntomas pueden variar de leves a muy graves. Pueden incluir los siguientes:  Cualquiera de los siguientes problemas en la parte inferior de la espalda, piernas, cadera o nalgas: ? Hormigueo leve, prdida de la sensibilidad o dolor sordo. ? Sensacin de ardor. ? Dolor agudo.  Prdida de la sensibilidad en la parte posterior de la pantorrilla o la planta del pie.  Debilidad en las piernas.  Dolor muy intenso en la espalda que dificulta el movimiento. Estos sntomas pueden  empeorar al toser, estornudar o rer. Tambin pueden empeorar al sentarse o estar de pie durante largos perodos. Cmo se trata? A menudo, esta afeccin mejora sin tratamiento. Sin embargo, el tratamiento puede incluir:  Multimedia programmerCambiar de Drexelactividad fsica o reducirla cuando siente dolor.  Hacer ejercicios y estiramientos.  Aplicar hielo o calor sobre la zona afectada.  Medicamentos para lo siguiente: ? Aliviar el dolor y la inflamacin. ? Relajar los msculos.  Inyecciones de medicamentos que ayudan a Engineer, materialsaliviar el dolor, la  irritacin y la hinchazn.  Ciruga. Siga estas instrucciones en su casa: Medicamentos  Baxter Internationalome los medicamentos de venta libre y los recetados solamente como se lo haya indicado el mdico.  Consulte a su mdico si el medicamento que le recetaron: ? Hace que sea necesario que evite conducir o usar maquinaria pesada. ? Puede causarle dificultad para defecar (estreimiento). Es posible que deba tomar estas medidas para prevenir o tratar los problemas para defecar:  Product managerBeber suficiente lquido para Radio producermantener el pis (la orina) de color amarillo plido.  Tomar medicamentos recetados o de H. J. Heinzventa libre.  Comer alimentos ricos en fibra. Entre ellos, frijoles, cereales integrales y frutas y verduras frescas.  Limitar los alimentos con alto contenido de grasa y International aid/development workerazcar. Estos incluyen alimentos fritos o dulces. Control del dolor      Si se lo indican, aplique hielo en la zona afectada. ? Ponga el hielo en una bolsa plstica. ? Coloque una FirstEnergy Corptoalla entre la piel y Copyla bolsa. ? Coloque el hielo durante 20minutos, 2 a 3veces por da.  Si se lo indican, aplique calor en la zona afectada. Use la fuente de calor que el mdico le indique, por ejemplo, una compresa de calor hmedo o una almohadilla trmica. ? Coloque una FirstEnergy Corptoalla entre la piel y la fuente de Airline pilotcalor. ? Aplique calor durante 20 a 30minutos. ? Retire la fuente de calor si la piel se pone de color rojo brillante. Esto es muy importante si no puede Financial risk analystsentir dolor, calor o fro. Puede correr un riesgo mayor de sufrir quemaduras. Actividad   Retome sus actividades habituales como se lo haya indicado el mdico. Pregntele al mdico qu actividades son seguras para usted.  Evite las Liberty Mutualactividades que empeoran los sntomas.  Descanse por breves perodos Administratordurante el da. ? Cuando descanse durante perodos ms largos, haga alguna actividad fsica o un estiramiento entre los perodos de descanso. ? Evite estar sentado durante largos perodos sin moverse.  Levntese y Garden Prairiemuvase al menos una vez cada hora.  Haga ejercicios y estrese con regularidad, como se lo indic el mdico.  No levante nada que pese ms de 10libras (4.5kg) mientras tenga sntomas de citica. ? Aunque no tenga sntomas, evite levantar objetos pesados. ? Evite levantar objetos pesados de forma repetida.  Al levantar objetos, hgalo siempre de una forma que sea segura para su cuerpo. Para esto, debe hacer lo siguiente: ? Flexione las rodillas. ? Mantenga el objeto cerca del cuerpo. ? No gire el cuerpo. Instrucciones generales  Mantenga un peso saludable.  Use calzado cmodo, que le d soporte al pie. Evite usar tacones.  Evite dormir sobre un colchn que sea demasiado blando o demasiado duro. Es posible que sienta menos dolor si duerme en un colchn con apoyo suficientemente firme para la espalda.  Concurra a todas las visitas de [address] West Eldor[address] Parkwayseguimiento como se lo haya indicado el mdico. Esto es importante. Comunquese con un mdico si:  Tiene un dolor con estas caractersticas: ? Lo despierta cuando est dormido. ? Empeora al The TJX Companiesestar  recostado. ? Es Event organiser que tena en el pasado. ? Dura ms de 4semanas.  Pierde peso sin proponrselo. Solicite ayuda inmediatamente si:  No puede controlar la orina (miccin) ni la evacuacin de la materia fecal (defecacin).  Tiene debilidad en alguna de estas zonas, y la debilidad empeora: ? La parte inferior de la espalda. ? La zona que se encuentra entre los [employer] caderas. ? Las nalgas. ? Las piernas.  Siente irritacin o inflamacin en la espalda.  Tiene sensacin de ardor al [employer]. Resumen  La citica es el dolor, debilidad, hormigueo o prdida de la sensibilidad (adormecimiento) a lo largo del nervio citico.  Esta afeccin se produce cuando el nervio citico se comprime o se ejerce presin sobre l.  La citica puede Engineer, drilling, hormigueo o prdida de la sensibilidad (adormecimiento) en la parte inferior  de la espalda, las piernas, las caderas y las nalgas.  El tratamiento a menudo incluye reposo, ejercicio, medicamentos y Midwife hielo o calor en la zona afectada. Esta informacin no tiene Marine scientist el consejo del mdico. Asegrese de hacerle al mdico cualquier pregunta que tenga. Document Released: 04/06/2010 Document Revised: 04/29/2018 Document Reviewed: 04/29/2018 Elsevier Patient Education  2020 [employer].

## 2018-09-15 NOTE — Progress Notes (Signed)
Isaac Hall 48 y.o.   Chief Complaint  Patient presents with  . Establish Care    former patient of Urban GibsonMani  . Leg Pain    LEFT to the left buttock since the beginning of June  . Medication Refill    Atorvastatin, Citalopram and Lisinopril    HISTORY OF PRESENT ILLNESS: This is a 48 y.o. male here to establish care with me.  Used to see PA United AutoMani.  Has the following medical problems: 1.  Diabetes, on metformin 500 mg twice a day.  Also on Lipitor 10 mg and lisinopril 5 mg daily. Lab Results  Component Value Date   HGBA1C 6.5 (H) 07/29/2017   2. Depression.  Presently on Celexa 20 mg.  Working well.  No complaints. 3.  Acute problem: Left sciatica pain for 1 month.  Denies injuries.  Sharp constant pain to left lumbar area radiating down the back of his leg, better with rest worse with movement, not associated with any other symptoms.  No bladder or bowel problems.  Leg Pain   Medication Refill Pertinent negatives include no abdominal pain, chest pain, chills, congestion, coughing, fever, headaches, nausea, rash, sore throat, vomiting or weakness.     Prior to Admission medications   Medication Sig Start Date End Date Taking? Authorizing Provider  atorvastatin (LIPITOR) 10 MG tablet Take 1 tablet (10 mg total) by mouth daily. 07/29/17  Yes Wallis BambergMani, Mario, PA-C  citalopram (CELEXA) 20 MG tablet TAKE ONE (1) TABLET BY MOUTH EVERY DAY 03/23/18  Yes Stallings, Zoe A, MD  lisinopril (PRINIVIL,ZESTRIL) 5 MG tablet Take 1 tablet (5 mg total) by mouth daily. 07/29/17  Yes Wallis BambergMani, Mario, PA-C  metFORMIN (GLUCOPHAGE) 500 MG tablet Take 1 tablet (500 mg total) by mouth 2 (two) times daily with a meal. 07/29/17  Yes Wallis BambergMani, Mario, PA-C  albuterol (PROVENTIL HFA;VENTOLIN HFA) 108 (90 Base) MCG/ACT inhaler Inhale 2 puffs into the lungs every 6 (six) hours as needed for wheezing or shortness of breath. Patient not taking: Reported on 09/15/2018 08/04/17   Morrell RiddleWeber, Sarah L, PA-C  meclizine (ANTIVERT) 25  MG tablet Take 1 tablet (25 mg total) by mouth 3 (three) times daily as needed for dizziness. Patient not taking: Reported on 09/15/2018 08/18/17   Wallis BambergMani, Mario, PA-C  ondansetron (ZOFRAN-ODT) 8 MG disintegrating tablet Take 1 tablet (8 mg total) by mouth every 8 (eight) hours as needed for nausea. Patient not taking: Reported on 09/15/2018 08/18/17   Wallis BambergMani, Mario, PA-C  [facility]  08/01/17   [provider]  valACYclovir (VALTREX) 1000 MG tablet Take 1 tablet (1,000 mg total) by mouth 3 (three) times daily. Patient not taking: Reported on 09/15/2018 08/18/17   Wallis BambergMani, Mario, PA-C    No Known Allergies  There are no active problems to display for this patient.   Past Medical History:  Diagnosis Date  . Anxiety   . Diabetes mellitus without complication [facility](HCC)     Past Surgical History:  Procedure Laterality Date  . VASECTOMY      Social History   Socioeconomic History  . Marital status: Married    Spouse name: Mary  . Number of children: 2  . Years of education: 12th grade  . Highest education level: Not on file  Occupational History  . Occupation: MACHINE OPERATOR    Employer: [employer]  Social Needs  . Financial resource strain: Not on file  . Food insecurity    Worry: Not on file    Inability:  Not on file  . Transportation needs    Medical: Not on file    Non-medical: Not on file  Tobacco Use  . Smoking status: Never Smoker  . Smokeless tobacco: Never Used  Substance and Sexual Activity  . Alcohol use: No  . Drug use: No  . Sexual activity: Not on file  Lifestyle  . Physical activity    Days per week: Not on file    Minutes per session: Not on file  . Stress: Not on file  Relationships  . Social Musician on phone: Not on file    Gets together: Not on file    Attends religious service: Not on file    Active member of club or organization: Not on file    Attends meetings of clubs or organizations: Not on file     Relationship status: Not on file  . Intimate partner violence    Fear of current or ex partner: Not on file    Emotionally abused: Not on file    Physically abused: Not on file    Forced sexual activity: Not on file  Other Topics Concern  . Not on file  Social History Narrative   Lives with his wife and their 2 children.   Originally from [place].  Came to the [place] in 1995.    Family History  Problem Relation Age of Onset  . Cancer Mother   . Diabetes Mother   . Diabetes Father   . Diabetes Sister   . Diabetes Sister   . Diabetes Sister   . Diabetes Sister   . Hypertension Sister      Review of Systems  Constitutional: Negative.  Negative for chills and fever.  HENT: Negative.  Negative for congestion and sore throat.   Eyes: Negative.   Respiratory: Negative.  Negative for cough and shortness of breath.   Cardiovascular: Negative for chest pain and palpitations.  Gastrointestinal: Negative.  Negative for abdominal pain, diarrhea, nausea and vomiting.  Genitourinary: Negative.  Negative for dysuria and hematuria.  Musculoskeletal: Positive for back pain.  Skin: Negative.  Negative for rash.  Neurological: Negative.  Negative for dizziness, sensory change, focal weakness, weakness and headaches.  Endo/Heme/Allergies: Negative.   All other systems reviewed and are negative.  Vitals:   09/15/18 1518  BP: 125/78  Pulse: 81  Resp: 16  Temp: 98.8 F (37.1 C)  SpO2: 98%     Physical Exam Vitals signs reviewed.  Constitutional:      Appearance: Normal appearance.  HENT:     Head: Normocephalic and atraumatic.     Nose: Nose normal.  Eyes:     Extraocular Movements: Extraocular movements intact.     Conjunctiva/sclera: Conjunctivae normal.     Pupils: Pupils are equal, round, and reactive to light.  Neck:     Musculoskeletal: Normal range of motion and neck supple. No muscular tenderness.     Vascular: No carotid bruit.  Cardiovascular:     Rate and Rhythm:  Normal rate and regular rhythm.     Heart sounds: Normal heart sounds.  Pulmonary:     Effort: Pulmonary effort is normal.     Breath sounds: Normal breath sounds.  Abdominal:     General: There is no distension.     Palpations: Abdomen is soft.     Tenderness: There is no abdominal tenderness.  Musculoskeletal: Normal range of motion.        General: No tenderness or signs of  injury.     Right lower leg: No edema.     Left lower leg: No edema.  Lymphadenopathy:     Cervical: No cervical adenopathy.  Skin:    General: Skin is warm and dry.     Capillary Refill: Capillary refill takes less than 2 seconds.  Neurological:     General: No focal deficit present.     Mental Status: He is alert and oriented to person, place, and time.     Sensory: No sensory deficit.     Motor: No weakness.     Coordination: Coordination normal.  Psychiatric:        Mood and Affect: Mood normal.        Behavior: Behavior normal.      ASSESSMENT & PLAN: Phuong was seen today for establish care, leg pain and medication refill.  Diagnoses and all orders for this visit:  Sciatica of left side -     cyclobenzaprine (FLEXERIL) 10 MG tablet; Take 1 tablet (10 mg total) by mouth at bedtime. -     meloxicam (MOBIC) 15 MG tablet; Take 1 tablet (15 mg total) by mouth daily.  Depression with anxiety -     Discontinue: citalopram (CELEXA) 20 MG tablet; Take 1 tablet (20 mg total) by mouth daily. -     citalopram (CELEXA) 20 MG tablet; Take 1 tablet (20 mg total) by mouth daily.  Controlled type 2 diabetes mellitus without complication, without long-term current use of insulin (HCC) -     lisinopril (ZESTRIL) 5 MG tablet; Take 1 tablet (5 mg total) by mouth daily. -     atorvastatin (LIPITOR) 10 MG tablet; Take 1 tablet (10 mg total) by mouth daily. -     Comprehensive metabolic panel -     Lipid panel -     Hemoglobin A1c  Low HDL (under 40) -     atorvastatin (LIPITOR) 10 MG tablet; Take 1 tablet  (10 mg total) by mouth daily.  Encounter to establish care    Patient Instructions       If you have lab work done today you will be contacted with your lab results within the next 2 weeks.  If you have not heard from us then please contact us. The fastest way to get your results is to register for My Chart.   IF you received an x-ray today, you will receive an invoice from [facility]. Please contact [facility] at [phone] with questions or concerns regarding your invoice.   IF you received labwork today, you will receive an invoice from [facility]. Please contact [facility] at [phone] with questions or concerns regarding your invoice.   Our billing staff will not be able to assist you with questions regarding bills from these companies.  You will be contacted with the lab results as soon as they are available. The fastest way to get your results is to activate your My Chart account. Instructions are located on the last page of this paperwork. If you have not heard from us regarding the results in 2 weeks, please contact this office.     Diabetes mellitus y nutricin, en adultos Diabetes Mellitus and Nutrition, Adult Si sufre de diabetes (diabetes mellitus), es muy importante tener hbitos alimenticios saludables debido a que sus niveles de Psychologist, counsellingazcar en la sangre (glucosa) se ven afectados en gran medida por lo que come y bebe. [place] alimentos saludables en las cantidades [address]adas, aproximadamente a la Smith Internationalmisma hora todos los das, [place] ayudar  a:  Controlar la glucemia.  Disminuir el riesgo de sufrir una enfermedad cardaca.  Mejorar la presin arterial.  Science writer o mantener un peso saludable. Todas las personas que sufren de diabetes son diferentes y cada una tiene necesidades diferentes en cuanto a un plan de alimentacin. El mdico puede recomendarle que trabaje con un especialista en dietas y nutricin (nutricionista) para Financial trader plan para usted.  Su plan de alimentacin puede variar segn factores como:  Las caloras que necesita.  Los medicamentos que toma.  Su peso.  Sus niveles de glucemia, presin arterial y colesterol.  Su nivel de [place].  Otras afecciones que tenga, como enfermedades cardacas o renales. Cmo me afectan los carbohidratos? Los carbohidratos, o hidratos de carbono, afectan su nivel de glucemia ms que cualquier otro tipo de alimento. La ingesta de carbohidratos naturalmente aumenta la cantidad de [employer]. El recuento de carbohidratos es un mtodo destinado a Catering manager un registro de la cantidad de carbohidratos que se consumen. El recuento de carbohidratos es importante para Theatre manager la glucemia a un nivel saludable, especialmente si utiliza insulina o toma determinados medicamentos por va oral para la diabetes. Es importante conocer la cantidad de carbohidratos que se pueden ingerir en cada comida sin correr Engineer, manufacturing. Esto es Psychologist, forensic. Su nutricionista puede ayudarlo a calcular la cantidad de carbohidratos que debe ingerir en cada comida y en cada refrigerio. Entre los alimentos que contienen carbohidratos, se incluyen:  Pan, cereal, arroz, pastas y galletas.  Papas y maz.  Guisantes, frijoles y lentejas.  Leche y Estate agent.  Lambert Mody y [place].  Postres, como pasteles, galletas, helado y caramelos. Cmo me afecta el alcohol? El alcohol puede provocar disminuciones sbitas de la glucemia (hipoglucemia), especialmente si utiliza insulina o toma determinados medicamentos por va oral para la diabetes. La hipoglucemia es una afeccin potencialmente mortal. Los sntomas de la hipoglucemia (somnolencia, mareos y confusin) son similares a los sntomas de haber consumido demasiado alcohol. Si el mdico afirma que el alcohol es seguro para usted, [place] estas pautas:  Limite el consumo de alcohol a no ms de 3medida por da si es mujer y no est Dresser, y a 54medidas si es  hombre. Una medida equivale a 12oz (312ml) de cerveza, 5oz (114ml) de vino o 1oz (60ml) de bebidas alcohlicas de alta graduacin.  No beba con el estmago vaco.  Mantngase hidratado bebiendo agua, refrescos dietticos o t helado sin azcar.  Tenga en cuenta que los refrescos comunes, los jugos y otras bebida para Optician, dispensing pueden contener mucha azcar y se deben contar como carbohidratos. Cules son algunos consejos para seguir este plan?  Leer las etiquetas de los alimentos  Comience por leer el tamao de la porcin en la "Informacin nutricional" en las etiquetas de los alimentos envasados y las bebidas. La cantidad de caloras, carbohidratos, grasas y otros nutrientes mencionados en la etiqueta se basan en una porcin del alimento. Muchos alimentos contienen ms de una porcin por envase.  Verifique la cantidad total de gramos (g) de carbohidratos totales en una porcin. Puede calcular la cantidad de porciones de carbohidratos al dividir el total de carbohidratos por 15. Por ejemplo, si un alimento tiene un total de 30g de carbohidratos, equivale a 2 porciones de carbohidratos.  Verifique la cantidad de gramos (g) de grasas saturadas y grasas trans en una porcin. Escoja alimentos que no contengan grasa o que tengan un bajo contenido.  Verifique la cantidad de miligramos (mg) de sal (sodio) en Ardelia Mems  porcin. La [employer]ley[employer]son de las personas deben limitar la ingesta de sodio total a menos de  por Futures trader.  Siempre consulte la informacin nutricional de los alimentos etiquetados como "con bajo contenido de grasa" o "sin grasa". Estos alimentos pueden tener un mayor contenido de International aid/development worker agregada o carbohidratos refinados, y deben evitarse.  Hable con su nutricionista para identificar sus objetivos diarios en cuanto a los nutrientes mencionados en la etiqueta. Al ir de compras  Evite comprar alimentos procesados, enlatados o precocinados. Estos alimentos tienden a Counselling psychologist mayor cantidad  de [place], sodio y azcar agregada.  Compre en la zona exterior de la tienda de comestibles. Esta zona incluye frutas y verduras frescas, granos a granel, carnes frescas y productos lcteos frescos. Al cocinar  Utilice mtodos de coccin a baja temperatura, como hornear, en lugar de mtodos de coccin a alta temperatura, como frer en abundante aceite.  Cocine con aceites saludables, como el aceite de [place], canola o [place].  Evite cocinar con manteca, crema o carnes con alto contenido de grasa. Planificacin de las comidas  Coma las comidas y los refrigerios regularmente, preferentemente a la misma hora todos [place]. Evite pasar largos perodos de tiempo sin comer.  Consuma alimentos ricos en fibra, como frutas frescas, verduras, frijoles y cereales integrales. Consulte a su nutricionista sobre cuntas porciones de carbohidratos puede consumir en cada comida.  Consuma entre 4 y 6 onzas (oz) de protenas magras por da, como carnes [place], pollo, pescado, huevos o tofu. Una onza de protena magra equivale a: ? 1 onza de carne, pollo o pescado. ? 1huevo. ?  taza de tofu.  Coma algunos alimentos por da que contengan grasas saludables, como aguacates, frutos secos, semillas y pescado. Estilo de vida  Controle su nivel de glucemia con regularidad.  Haga actividad fsica habitualmente como se lo haya indicado el mdico. Esto puede incluir lo siguiente: ? semanales de ejercicio de intensidad moderada o alta. Esto podra incluir caminatas dinmicas, ciclismo o gimnasia acutica. ? Realizar ejercicios de elongacin y de fortalecimiento, como yoga o levantamiento de pesas, por lo menos 2veces por semana.  Tome los Monsanto Company se lo haya indicado el mdico.  No consuma ningn producto que contenga nicotina o tabaco, como cigarrillos y Administrator, Civil Service. Si necesita ayuda para dejar de fumar, consulte al CIGNA con un asesor o instructor en diabetes  para identificar estrategias para controlar el estrs y cualquier desafo emocional y social. Preguntas para hacerle al mdico  Es necesario que consulte a IT trainer en el cuidado de la diabetes?  Es necesario que me rena con un nutricionista?  A qu nmero puedo llamar si tengo preguntas?  Cules son los mejores momentos para controlar la glucemia? Dnde encontrar ms informacin:  Asociacin Estadounidense de la Diabetes (American Diabetes Association): diabetes.org  Academia de Nutricin y Pension scheme manager (Academy of Nutrition and Dietetics): www.eatright.org  The [employer]ger de la Diabetes y las Enfermedades Digestivas y [place] of Diabetes and Digestive and Kidney Diseases, NIH): CarFlippers.tn Resumen  Un plan de alimentacin saludable lo ayudar a Scientist, physiological glucemia y Pharmacologist un estilo de vida saludable.  Trabajar con un especialista en dietas y nutricin (nutricionista) puede ayudarlo a Designer, television/film set de alimentacin para usted.  Tenga en cuenta que los carbohidratos (hidratos de carbono) y el alcohol tienen efectos inmediatos en sus niveles de glucemia. Es importante contar los carbohidratos que ingiere y consumir alcohol con prudencia. Esta informacin no tiene Theme park manager el consejo  del mdico. Asegrese de hacerle al mdico cualquier pregunta que tenga. Document Released: 06/11/2007 Document Revised: 11/12/2016 Document Reviewed: 06/24/2016 Elsevier Patient Education  2020 Elsevier Inc.  Engineer, agricultural Sciatica  La citica es el dolor, debilidad, hormigueo o prdida de la sensibilidad (adormecimiento) a lo largo del nervio citico. El nervio citico comienza en la parte inferior de la espalda y desciende por la parte posterior de cada pierna. Suele desaparecer por s sola o con tratamiento. A veces, la citica puede volver a aparecer (ser recurrente). Cules son las causas? Esta afeccin se produce cuando el nervio citico se  comprime o se ejerce presin sobre l. Esto puede ser el resultado de:  Un disco que sobresale demasiado entre los huesos de la columna vertebral (hernia de disco).  Los cambios que se producen Teachers Insurance and Annuity Association discos vertebrales al Secretary/administrator.  Una afeccin en un msculo de las nalgas.  Un crecimiento seo adicional cerca del nervio citico.  Una rotura (fractura) de la zona que est entre los huesos de la cadera (pelvis).  Embarazo.  Tumor. Esto es poco frecuente. Qu incrementa el riesgo? Es ms probable que tengan esta afeccin las personas que:  Software engineer deportes que ponen presin o tensin sobre la columna vertebral.  Tienen poca fuerza y facilidad de movimiento (flexibilidad).  Han tenido una lesin en la espalda en el pasado.  Han tenido una ciruga en la espalda.  Permanecen sentadas durante largos perodos.  Realizan actividades que implican agacharse o levantar objetos una y [place].  Tienen mucho sobrepeso (es obeso). Cules son los signos o los sntomas? Los sntomas pueden variar de leves a muy graves. Pueden incluir los siguientes:  Cualquiera de los siguientes problemas en la parte inferior de la espalda, piernas, cadera o nalgas: ? Hormigueo leve, prdida de la sensibilidad o dolor sordo. ? Sensacin de ardor. ? Dolor agudo.  Prdida de la sensibilidad en la parte posterior de la pantorrilla o la planta del pie.  Debilidad en las piernas.  Dolor muy intenso en la espalda que dificulta el movimiento. Estos sntomas pueden empeorar al toser, Engineering geologist o rer. Tambin pueden empeorar al sentarse o estar de pie durante largos perodos. Cmo se trata? A menudo, esta afeccin mejora sin tratamiento. Sin embargo, el tratamiento puede incluir:  Multimedia programmer de [place] fsica o reducirla cuando siente dolor.  Hacer ejercicios y estiramientos.  Aplicar hielo o calor sobre la zona afectada.  Medicamentos para lo siguiente: ? Aliviar el dolor y la inflamacin. ?  Relajar los msculos.  Inyecciones de medicamentos que ayudan a Engineer, materials, la irritacin y la hinchazn.  Ciruga. Siga estas instrucciones en su casa: Medicamentos  Baxter International de venta libre y los recetados solamente como se lo haya indicado el mdico.  Consulte a su mdico si el medicamento que le recetaron: ? Hace que sea necesario que evite conducir o usar maquinaria pesada. ? Puede causarle dificultad para defecar (estreimiento). Es posible que deba tomar estas medidas para prevenir o tratar los problemas para defecar:  Product manager suficiente lquido para Radio producer pis (la orina) de color amarillo plido.  Tomar medicamentos recetados o de H. J. Heinz.  Comer alimentos ricos en fibra. Entre ellos, frijoles, cereales integrales y frutas y verduras frescas.  Limitar los alimentos con alto contenido de grasa y International aid/development worker. Estos incluyen alimentos fritos o dulces. Control del dolor      Si se lo indican, aplique hielo en la zona afectada. ? Ponga el hielo en una bolsa plstica. ? Coloque una  toalla entre la piel y la bolsa. ? Coloque el hielo durante 20minutos, 2 a 3veces por da.  Si se lo indican, aplique calor en la zona afectada. Use la fuente de calor que el mdico le indique, por ejemplo, una compresa de calor hmedo o una almohadilla trmica. ? Coloque una FirstEnergy Corptoalla entre la piel y la fuente de Airline pilotcalor. ? Aplique calor durante 20 a 30minutos. ? Retire la fuente de calor si la piel se pone de color rojo brillante. Esto es muy importante si no puede Financial risk analystsentir dolor, calor o fro. Puede correr un riesgo mayor de sufrir quemaduras. Actividad   Retome sus actividades habituales como se lo haya indicado el mdico. Pregntele al mdico qu actividades son seguras para usted.  Evite las Liberty Mutualactividades que empeoran los sntomas.  Descanse por breves perodos Administratordurante el da. ? Cuando descanse durante perodos ms largos, haga alguna actividad fsica o un estiramiento entre  los perodos de descanso. ? Evite estar sentado durante largos perodos sin moverse. Levntese y Centraliamuvase al menos una vez cada hora.  Haga ejercicios y estrese con regularidad, como se lo indic el mdico.  No levante nada que pese ms de 10libras (4.5kg) mientras tenga sntomas de citica. ? Aunque no tenga sntomas, evite levantar objetos pesados. ? Evite levantar objetos pesados de forma repetida.  Al levantar objetos, hgalo siempre de una forma que sea segura para su cuerpo. Para esto, debe hacer lo siguiente: ? Flexione las rodillas. ? Mantenga el objeto cerca del cuerpo. ? No gire el cuerpo. Instrucciones generales  Mantenga un peso saludable.  Use calzado cmodo, que le d soporte al pie. Evite usar tacones.  Evite dormir sobre un colchn que sea demasiado blando o demasiado duro. Es posible que sienta menos dolor si duerme en un colchn con apoyo suficientemente firme para la espalda.  Concurra a todas las visitas de [address]se[address] como se lo haya indicado el mdico. Esto es importante. Comunquese con un mdico si:  Tiene un dolor con estas caractersticas: ? Lo despierta cuando est dormido. ? Empeora al estar recostado. ? Es Government social research officerpeor que el dolor que tena en el pasado. ? Dura ms de 4semanas.  Pierde peso sin proponrselo. Solicite ayuda inmediatamente si:  No puede controlar la orina (miccin) ni la evacuacin de la materia fecal (defecacin).  Tiene debilidad en alguna de estas zonas, y la debilidad empeora: ? La parte inferior de la espalda. ? La zona que se encuentra entre los Affiliated Computer Serviceshuesos de las caderas. ? Las nalgas. ? Las piernas.  Siente irritacin o inflamacin en la espalda.  Tiene sensacin de ardor al ConocoPhillipsorinar. Resumen  La citica es el dolor, debilidad, hormigueo o prdida de la sensibilidad (adormecimiento) a lo largo del nervio citico.  Esta afeccin se produce cuando el nervio citico se comprime o se ejerce presin sobre l.  La citica puede  Programmer, multimediacausar dolor, hormigueo o prdida de la sensibilidad (adormecimiento) en la parte inferior de la espalda, las piernas, las caderas y las nalgas.  El tratamiento a menudo incluye reposo, ejercicio, medicamentos y Contractoraplicar hielo o calor en la zona afectada. Esta informacin no tiene Theme park managercomo fin reemplazar el consejo del mdico. Asegrese de hacerle al mdico cualquier pregunta que tenga. Document Released: 04/06/2010 Document Revised: 04/29/2018 Document Reviewed: 04/29/2018 Elsevier Patient Education  2020 [employer]      Edwina BarthMiguel , MD [facility]

## 2018-09-16 LAB — COMPREHENSIVE METABOLIC PANEL
ALT: 45 IU/L — ABNORMAL HIGH (ref 0–44)
AST: 33 IU/L (ref 0–40)
Albumin/Globulin Ratio: 1.8 (ref 1.2–2.2)
Albumin: 4.9 g/dL (ref 4.0–5.0)
Alkaline Phosphatase: 74 IU/L (ref 39–117)
BUN/Creatinine Ratio: 15 (ref 9–20)
BUN: 11 mg/dL (ref 6–24)
Bilirubin Total: 0.3 mg/dL (ref 0.0–1.2)
CO2: 20 mmol/L (ref 20–29)
Calcium: 9.8 mg/dL (ref 8.7–10.2)
Chloride: 101 mmol/L (ref 96–106)
Creatinine, Ser: 0.75 mg/dL — ABNORMAL LOW (ref 0.76–1.27)
GFR calc Af Amer: 125 mL/min/{1.73_m2} (ref >59)
GFR calc non Af Amer: 108 mL/min/{1.73_m2} (ref >59)
Globulin, Total: 2.7 g/dL (ref 1.5–4.5)
Glucose: 135 mg/dL — ABNORMAL HIGH (ref 65–99)
Potassium: 4.3 mmol/L (ref 3.5–5.2)
Sodium: 138 mmol/L (ref 134–144)
Total Protein: 7.6 g/dL (ref 6.0–8.5)

## 2018-09-16 LAB — LIPID PANEL
Chol/HDL Ratio: 4.8 ratio (ref 0.0–5.0)
Cholesterol, Total: 158 mg/dL (ref 100–199)
HDL: 33 mg/dL — ABNORMAL LOW (ref >39)
LDL Calculated: 83 mg/dL (ref 0–99)
Triglycerides: 210 mg/dL — ABNORMAL HIGH (ref 0–149)
VLDL Cholesterol Cal: 42 mg/dL — ABNORMAL HIGH (ref 5–40)

## 2018-09-16 LAB — HEMOGLOBIN A1C
Est. average glucose Bld gHb Est-mCnc: 148 mg/dL
Hgb A1c MFr Bld: 6.8 % — ABNORMAL HIGH (ref 4.8–5.6)

## 2018-09-23 DIAGNOSIS — M545 Low back pain: Secondary | ICD-10-CM | POA: Diagnosis not present

## 2018-09-28 DIAGNOSIS — M545 Low back pain: Secondary | ICD-10-CM | POA: Diagnosis not present

## 2018-10-06 DIAGNOSIS — M545 Low back pain: Secondary | ICD-10-CM | POA: Diagnosis not present

## 2018-10-06 DIAGNOSIS — M5126 Other intervertebral disc displacement, lumbar region: Secondary | ICD-10-CM | POA: Diagnosis not present

## 2018-10-06 DIAGNOSIS — M5416 Radiculopathy, lumbar region: Secondary | ICD-10-CM | POA: Diagnosis not present

## 2018-10-07 DIAGNOSIS — M5126 Other intervertebral disc displacement, lumbar region: Secondary | ICD-10-CM | POA: Diagnosis not present

## 2018-10-07 DIAGNOSIS — M5416 Radiculopathy, lumbar region: Secondary | ICD-10-CM | POA: Diagnosis not present

## 2018-10-07 DIAGNOSIS — M545 Low back pain: Secondary | ICD-10-CM | POA: Diagnosis not present

## 2018-10-08 ENCOUNTER — Other Ambulatory Visit: Payer: Self-pay | Admitting: Emergency Medicine

## 2018-10-08 DIAGNOSIS — M5432 Sciatica, left side: Secondary | ICD-10-CM

## 2018-10-08 NOTE — Telephone Encounter (Signed)
Forwarding medication refill to PCP for review. 

## 2018-10-27 DIAGNOSIS — M5126 Other intervertebral disc displacement, lumbar region: Secondary | ICD-10-CM | POA: Diagnosis not present

## 2018-10-27 DIAGNOSIS — M25561 Pain in right knee: Secondary | ICD-10-CM | POA: Diagnosis not present

## 2018-10-27 DIAGNOSIS — M545 Low back pain: Secondary | ICD-10-CM | POA: Diagnosis not present

## 2018-10-27 DIAGNOSIS — M5416 Radiculopathy, lumbar region: Secondary | ICD-10-CM | POA: Diagnosis not present

## 2018-11-15 ENCOUNTER — Emergency Department (HOSPITAL_BASED_OUTPATIENT_CLINIC_OR_DEPARTMENT_OTHER)
Admission: EM | Admit: 2018-11-15 | Discharge: 2018-11-16 | Disposition: A | Discharge status: Home / Self Care | Payer: BC Managed Care – PPO | Attending: Emergency Medicine | Admitting: Emergency Medicine

## 2018-11-15 ENCOUNTER — Other Ambulatory Visit: Payer: Self-pay

## 2018-11-15 ENCOUNTER — Encounter (HOSPITAL_BASED_OUTPATIENT_CLINIC_OR_DEPARTMENT_OTHER): Payer: Self-pay | Admitting: Emergency Medicine

## 2018-11-15 DIAGNOSIS — X500XXA Overexertion from strenuous movement or load, initial encounter: Secondary | ICD-10-CM | POA: Diagnosis not present

## 2018-11-15 DIAGNOSIS — M545 Low back pain: Secondary | ICD-10-CM | POA: Diagnosis not present

## 2018-11-15 DIAGNOSIS — M5442 Lumbago with sciatica, left side: Secondary | ICD-10-CM | POA: Diagnosis not present

## 2018-11-15 DIAGNOSIS — E119 Type 2 diabetes mellitus without complications: Secondary | ICD-10-CM | POA: Insufficient documentation

## 2018-11-15 DIAGNOSIS — Z7984 Long term (current) use of oral hypoglycemic drugs: Secondary | ICD-10-CM | POA: Diagnosis not present

## 2018-11-15 DIAGNOSIS — N200 Calculus of kidney: Secondary | ICD-10-CM | POA: Diagnosis not present

## 2018-11-15 DIAGNOSIS — R300 Dysuria: Secondary | ICD-10-CM | POA: Diagnosis not present

## 2018-11-15 DIAGNOSIS — R1030 Lower abdominal pain, unspecified: Secondary | ICD-10-CM | POA: Insufficient documentation

## 2018-11-15 DIAGNOSIS — Z79899 Other long term (current) drug therapy: Secondary | ICD-10-CM | POA: Diagnosis not present

## 2018-11-15 LAB — CBG MONITORING, ED: Glucose-Capillary: 133 mg/dL — ABNORMAL HIGH (ref 70–99)

## 2018-11-15 MED ORDER — FENTANYL CITRATE (PF) 100 MCG/2ML IJ SOLN
50.0000 ug | INTRAMUSCULAR | Status: DC | PRN
  Administered 2018-11-15: 50 ug via INTRAVENOUS

## 2018-11-15 MED ORDER — FENTANYL CITRATE (PF) 100 MCG/2ML IJ SOLN
INTRAMUSCULAR | Status: AC
  Filled 2018-11-15: qty 2

## 2018-11-15 NOTE — ED Triage Notes (Signed)
Pt arrives via EMS from home, reports L back pain starting today. Reports he lifts 200 pounds or more regularly. States he has been nonambulatory for about an hour. Arrives on stretcher in prone position. hx sciatica, diabetes. Reports tingling/numbness in legs, but states this is not new for him. No loss of control of b&B

## 2018-11-16 ENCOUNTER — Emergency Department (HOSPITAL_BASED_OUTPATIENT_CLINIC_OR_DEPARTMENT_OTHER): Payer: BC Managed Care – PPO

## 2018-11-16 ENCOUNTER — Emergency Department (HOSPITAL_COMMUNITY)
Admission: EM | Admit: 2018-11-16 | Discharge: 2018-11-16 | Disposition: A | Discharge status: Home / Self Care | Payer: BC Managed Care – PPO | Source: Home / Self Care | Attending: Emergency Medicine | Admitting: Emergency Medicine

## 2018-11-16 ENCOUNTER — Encounter (HOSPITAL_COMMUNITY): Payer: Self-pay | Admitting: Emergency Medicine

## 2018-11-16 ENCOUNTER — Other Ambulatory Visit: Payer: Self-pay

## 2018-11-16 DIAGNOSIS — R200 Anesthesia of skin: Secondary | ICD-10-CM | POA: Insufficient documentation

## 2018-11-16 DIAGNOSIS — E119 Type 2 diabetes mellitus without complications: Secondary | ICD-10-CM | POA: Insufficient documentation

## 2018-11-16 DIAGNOSIS — M5442 Lumbago with sciatica, left side: Secondary | ICD-10-CM | POA: Insufficient documentation

## 2018-11-16 DIAGNOSIS — Z79899 Other long term (current) drug therapy: Secondary | ICD-10-CM | POA: Insufficient documentation

## 2018-11-16 DIAGNOSIS — N200 Calculus of kidney: Secondary | ICD-10-CM | POA: Diagnosis not present

## 2018-11-16 DIAGNOSIS — Z7984 Long term (current) use of oral hypoglycemic drugs: Secondary | ICD-10-CM | POA: Insufficient documentation

## 2018-11-16 LAB — BASIC METABOLIC PANEL
Anion gap: 12 (ref 5–15)
BUN: 15 mg/dL (ref 6–20)
CO2: 22 mmol/L (ref 22–32)
Calcium: 9.5 mg/dL (ref 8.9–10.3)
Chloride: 102 mmol/L (ref 98–111)
Creatinine, Ser: 0.87 mg/dL (ref 0.61–1.24)
GFR calc Af Amer: >60 mL/min (ref >60)
GFR calc non Af Amer: >60 mL/min (ref >60)
Glucose, Bld: 153 mg/dL — ABNORMAL HIGH (ref 70–99)
Potassium: 4 mmol/L (ref 3.5–5.1)
Sodium: 136 mmol/L (ref 135–145)

## 2018-11-16 LAB — CBC WITH DIFFERENTIAL/PLATELET
Abs Immature Granulocytes: 0.02 10*3/uL (ref 0.00–0.07)
Basophils Absolute: 0 10*3/uL (ref 0.0–0.1)
Basophils Relative: 0 %
Eosinophils Absolute: 0.1 10*3/uL (ref 0.0–0.5)
Eosinophils Relative: 2 %
HCT: 45.2 % (ref 39.0–52.0)
Hemoglobin: 15.1 g/dL (ref 13.0–17.0)
Immature Granulocytes: 0 %
Lymphocytes Relative: 17 %
Lymphs Abs: 1.4 10*3/uL (ref 0.7–4.0)
MCH: 29.9 pg (ref 26.0–34.0)
MCHC: 33.4 g/dL (ref 30.0–36.0)
MCV: 89.5 fL (ref 80.0–100.0)
Monocytes Absolute: 0.4 10*3/uL (ref 0.1–1.0)
Monocytes Relative: 6 %
Neutro Abs: 6 10*3/uL (ref 1.7–7.7)
Neutrophils Relative %: 75 %
Platelets: 195 10*3/uL (ref 150–400)
RBC: 5.05 MIL/uL (ref 4.22–5.81)
RDW: 12.3 % (ref 11.5–15.5)
WBC: 8 10*3/uL (ref 4.0–10.5)
nRBC: 0 % (ref 0.0–0.2)

## 2018-11-16 LAB — URINALYSIS, MICROSCOPIC (REFLEX): Bacteria, UA: NONE SEEN

## 2018-11-16 LAB — URINALYSIS, ROUTINE W REFLEX MICROSCOPIC
Bilirubin Urine: NEGATIVE
Glucose, UA: NEGATIVE mg/dL
Ketones, ur: NEGATIVE mg/dL
Leukocytes,Ua: NEGATIVE
Nitrite: NEGATIVE
Protein, ur: NEGATIVE mg/dL
Specific Gravity, Urine: 1.015 (ref 1.005–1.030)
pH: 7 (ref 5.0–8.0)

## 2018-11-16 MED ORDER — GABAPENTIN 300 MG PO CAPS
ORAL_CAPSULE | ORAL | Status: AC
  Administered 2018-11-16: 600 mg
  Filled 2018-11-16: qty 2

## 2018-11-16 MED ORDER — GABAPENTIN 600 MG PO TABS
600.0000 mg | ORAL_TABLET | Freq: Once | ORAL | Status: DC
  Filled 2018-11-16: qty 1

## 2018-11-16 MED ORDER — HYDROMORPHONE HCL 1 MG/ML IJ SOLN
1.0000 mg | Freq: Once | INTRAMUSCULAR | Status: AC
  Administered 2018-11-16: 1 mg via INTRAMUSCULAR
  Filled 2018-11-16: qty 1

## 2018-11-16 MED ORDER — HYDROMORPHONE HCL 1 MG/ML IJ SOLN
1.0000 mg | Freq: Once | INTRAMUSCULAR | Status: AC
  Administered 2018-11-16: 12:00:00 1 mg via INTRAVENOUS
  Filled 2018-11-16: qty 1

## 2018-11-16 MED ORDER — KETOROLAC TROMETHAMINE 30 MG/ML IJ SOLN
30.0000 mg | Freq: Once | INTRAMUSCULAR | Status: AC
  Administered 2018-11-16: 30 mg via INTRAVENOUS
  Filled 2018-11-16: qty 1

## 2018-11-16 MED ORDER — OXYCODONE-ACETAMINOPHEN 5-325 MG PO TABS: 1.0000 | ORAL_TABLET | Freq: Four times a day (QID) | ORAL | 0 refills | Status: DC | PRN

## 2018-11-16 MED ORDER — CYCLOBENZAPRINE HCL 10 MG PO TABS: 10.0000 mg | ORAL_TABLET | Freq: Two times a day (BID) | ORAL | 0 refills | Status: DC | PRN

## 2018-11-16 MED ORDER — KETAMINE HCL 10 MG/ML IJ SOLN
0.3000 mg/kg | Freq: Once | INTRAMUSCULAR | Status: AC
  Administered 2018-11-16: 26 mg via INTRAVENOUS

## 2018-11-16 MED ORDER — HALOPERIDOL LACTATE 5 MG/ML IJ SOLN
2.0000 mg | Freq: Once | INTRAMUSCULAR | Status: AC
  Administered 2018-11-16: 2 mg via INTRAVENOUS
  Filled 2018-11-16: qty 1

## 2018-11-16 MED ORDER — LIDOCAINE 5 % EX PTCH: 1.0000 | MEDICATED_PATCH | CUTANEOUS | 0 refills | Status: DC

## 2018-11-16 MED ORDER — HYDROMORPHONE HCL 1 MG/ML IJ SOLN
2.0000 mg | Freq: Once | INTRAMUSCULAR | Status: AC
  Administered 2018-11-16: 2 mg via INTRAVENOUS
  Filled 2018-11-16: qty 2

## 2018-11-16 MED ORDER — KETAMINE HCL 10 MG/ML IJ SOLN
INTRAMUSCULAR | Status: AC
  Filled 2018-11-16: qty 1

## 2018-11-16 MED ORDER — KETOROLAC TROMETHAMINE 30 MG/ML IJ SOLN
30.0000 mg | Freq: Once | INTRAMUSCULAR | Status: AC
  Administered 2018-11-16: 30 mg via INTRAMUSCULAR
  Filled 2018-11-16: qty 1

## 2018-11-16 MED ORDER — DEXAMETHASONE SODIUM PHOSPHATE 10 MG/ML IJ SOLN
10.0000 mg | Freq: Once | INTRAMUSCULAR | Status: AC
  Administered 2018-11-16: 10 mg via INTRAMUSCULAR
  Filled 2018-11-16: qty 1

## 2018-11-16 MED ORDER — PREDNISONE 10 MG (21) PO TBPK: ORAL_TABLET | ORAL | 0 refills | Status: DC

## 2018-11-16 MED ORDER — LORAZEPAM 2 MG/ML IJ SOLN
1.0000 mg | Freq: Once | INTRAMUSCULAR | Status: AC
  Administered 2018-11-16: 12:00:00 1 mg via INTRAVENOUS
  Filled 2018-11-16: qty 1

## 2018-11-16 NOTE — Discharge Instructions (Addendum)
1. Medications: Start taking steroid taper as prescribed.  Do not take ibuprofen, Advil, Motrin, or Aleve while taking this medication.  You can take 1 to 2 tablets of Tylenol every 6 hours additionally however.  Be aware the steroid taper can increase your blood sugar so please check these at home and call your primary care provider if your blood sugars increase significantly.  When you are done taking the steroid taper you can alternate 600 mg of ibuprofen and [phone] mg of Tylenol every 3 hours as needed for pain. Do not exceed 4000 mg of Tylenol daily.  Take ibuprofen with food to avoid upset stomach issues.  You can take Flexeril as needed for muscle spasm up to twice daily but do not drive, drink alcohol, or operate heavy machinery while taking this medicine because it may make you drowsy.  I typically recommend taking this medicine only at night when you are going to sleep.  You can also cut these tablets in half if they make you feel very drowsy.  Apply lidocaine patches to areas of pain.  You can take the percocet prescribed to you by Dr. Dina Rich as needed for severe pain but do not drive, drink alcohol, operate heavy machinery while taking this medication as it can cause drowsiness.  Also be aware that this contains Tylenol.  This medication can also cause constipation so take with a stool softener. 2. Treatment: rest, drink plenty of fluids, gentle stretching as discussed (see attached), alternate ice and heat (or stick with whichever feels best) 20 minutes on 20 minutes off. 3. Follow Up: Please followup with your orthopedist in 2 days as scheduled for reevaluation of your symptoms; return to the ER for worsening back pain, difficulty walking, loss of bowel or bladder control or other concerning symptoms

## 2018-11-16 NOTE — ED Provider Notes (Signed)
[place] EMERGENCY DEPARTMENT Provider Note   CSN: [id number] Arrival date & time: 11/16/18  1015     History   Chief Complaint Chief Complaint  Patient presents with  . Sciatica    HPI Jefferson Stavola is a 48 y.o. male with a history of anxiety, diabetes mellitus presents for evaluation of acute onset, ongoing left-sided low back pain with radiation to the left upper extremity.  Symptoms began on Wednesday 5 days ago after lifting a heavy appliance.  Since then he has had constant sharp and aching pain that radiates from the low back down both left lower extremity.  It worsens with certain positions and attempts to ambulate, improves laying on his right side.  Denies bowel or bladder incontinence, saddle anesthesia, fevers, or history of IV drug use.  Reports that he was seen in reviewing orthopedics and underwent an ESI 2 weeks ago with significant improvement in his symptoms but his pain flared up after lifting the heavy appliance.  To review shows that he was seen and evaluated at [facility] overnight received numerous pain medications including Dilaudid, fentanyl, Decadron, Toradol, Ketamine, gabapentin, Haldol without relief in his symptoms.  He began to complain of groin pain so he underwent a CT scanning of his abdomen which showed no evidence of nephrolithiasis or acute surgical abdominal pathology.  He denies any urinary symptoms, nausea, vomiting, diarrhea, constipation, fevers, chills, chest pain, or shortness of breath.  He was offered an MRI at [facility] but refused as he recently had an MRI with his orthopedist.     The history is provided by the patient.    Past Medical History:  Diagnosis Date  . Anxiety   . Diabetes mellitus without complication ([place])     There are no active problems to display for this patient.   Past Surgical History:  Procedure Laterality Date  . VASECTOMY          Home Medications     Prior to Admission medications   Medication Sig Start Date End Date Taking? Authorizing Provider  albuterol (PROVENTIL HFA;VENTOLIN HFA) 108 (90 Base) MCG/ACT inhaler Inhale 2 puffs into the lungs every 6 (six) hours as needed for wheezing or shortness of breath. Patient not taking: Reported on 09/15/2018 08/04/17   Mancel Bale, PA-C  atorvastatin (LIPITOR) 10 MG tablet Take 1 tablet (10 mg total) by mouth daily. 09/15/18   Horald Pollen, MD  citalopram (CELEXA) 20 MG tablet Take 1 tablet (20 mg total) by mouth daily. 09/15/18 12/14/18  Horald Pollen, MD  cyclobenzaprine (FLEXERIL) 10 MG tablet Take 1 tablet (10 mg total) by mouth 2 (two) times daily as needed. 11/16/18   ,  A, PA-C  gabapentin (NEURONTIN) 300 MG capsule Take 300 mg by mouth 2 (two) times daily. 09/23/18   [provider]  lidocaine (LIDODERM) 5 % Place 1 patch onto the skin daily. Remove & Discard patch within 12 hours or as directed by MD 11/16/18   Rodell Perna A, PA-C  lisinopril (ZESTRIL) 5 MG tablet Take 1 tablet (5 mg total) by mouth daily. 09/15/18   Horald Pollen, MD  meclizine (ANTIVERT) 25 MG tablet Take 1 tablet (25 mg total) by mouth 3 (three) times daily as needed for dizziness. Patient not taking: Reported on 09/15/2018 08/18/17   [name], PA-C  meloxicam [facility]) 15 MG tablet TAKE 1 TABLET BY MOUTH EVERY DAY 10/08/18   Horald Pollen, MD  metFORMIN (GLUCOPHAGE) 500 MG tablet Take 1 tablet (500 mg total) by mouth 2 (two) times daily with a meal. 07/29/17   Wallis BambergMani, Mario, PA-C  ondansetron (ZOFRAN-ODT) 8 MG disintegrating tablet Take 1 tablet (8 mg total) by mouth every 8 (eight) hours as needed for nausea. Patient not taking: Reported on 09/15/2018 08/18/17   Wallis BambergMani, Mario, PA-C  [facility] LP[facility] DEL[facility] LANCETS 33G MISC  08/01/17   [provider]  oxyCODONE-acetaminophen (PERCOCET/ROXICET) 5-325 MG tablet Take 1-2 tablets by mouth every 6 (six) hours as needed for severe pain. 11/16/18    Horton, Mayer Maskerourtney F, MD  predniSONE (STERAPRED UNI-PAK 21 TAB) 10 MG (21) TBPK tablet Take 6 tabs by mouth daily x2 days, then 5 tabs x2 days, then 4 tabs x2 days, then 3 tabs x2 days, 2 tabs x2 days, then 1 tab daily x2 days 11/16/18   Michela PitcherFawze,  A, PA-C  valACYclovir (VALTREX) 1000 MG tablet Take 1 tablet (1,000 mg total) by mouth 3 (three) times daily. Patient not taking: Reported on 09/15/2018 08/18/17   Wallis BambergMani, Mario, PA-C    Family History Family History  Problem Relation Age of Onset  . Cancer Mother   . Diabetes Mother   . Diabetes Father   . Diabetes Sister   . Diabetes Sister   . Diabetes Sister   . Diabetes Sister   . Hypertension Sister     Social History Social History   Tobacco Use  . Smoking status: Never Smoker  . Smokeless tobacco: Never Used  Substance Use Topics  . Alcohol use: No  . Drug use: No     Allergies   Patient has no known allergies.   Review of Systems Review of Systems  Constitutional: Negative for chills and fever.  Respiratory: Negative for shortness of breath.   Cardiovascular: Negative for chest pain.  Gastrointestinal: Negative for abdominal pain, constipation, diarrhea, nausea and vomiting.  Genitourinary: Negative for dysuria, frequency, hematuria and testicular pain.  Musculoskeletal: Positive for back pain.  Neurological: Positive for numbness. Negative for weakness.  All other systems reviewed and are negative.    Physical Exam Updated Vital Signs BP 127/73 (BP Location: Left Arm)   Pulse (!) 125   Temp 98.5 F (36.9 C) (Oral)   Resp 18   SpO2 94%   Physical Exam Vitals signs and nursing note reviewed.  Constitutional:      Appearance: He is well-developed.     Comments: Appears uncomfortable, frequently moving his lower extremities, flexing at the hips  HENT:     Head: Normocephalic and atraumatic.  Eyes:     General:        Right eye: No discharge.        Left eye: No discharge.     Conjunctiva/sclera:  Conjunctivae normal.  Neck:     Vascular: No JVD.     Trachea: No tracheal deviation.  Cardiovascular:     Rate and Rhythm: Regular rhythm. Tachycardia present.     Pulses: Normal pulses.     Comments: 2+ DP/PT pulses bilaterally, Homans sign absent bilaterally, no lower extremity edema, no palpable cords, compartments are soft  Pulmonary:     Effort: Pulmonary effort is normal.     Breath sounds: Normal breath sounds.  Abdominal:     General: Bowel sounds are normal. There is no distension.     Palpations: Abdomen is soft.     Tenderness: There is no abdominal tenderness. There is no right CVA tenderness, left CVA tenderness, guarding or  rebound.  Musculoskeletal:        General: Tenderness present.     Comments: Tenderness to the midline lumbar spine at around the level of L4-S1, left paralumbar muscle tenderness and left SI joint tenderness noted.  No deformity, crepitus, or step-off.  5/5 strength of BLE major muscle groups.  Skin:    General: Skin is warm and dry.     Findings: No erythema.  Neurological:     Mental Status: He is alert.     Comments: Fluent speech, no facial droop, sensation intact to light touch of bilateral lower extremities.  Psychiatric:        Behavior: Behavior normal.      ED Treatments / Results  Labs (all labs ordered are listed, but only abnormal results are displayed) Labs Reviewed - No data to display  EKG None  Radiology Ct Renal Stone Study  Result Date: 11/16/2018 CLINICAL DATA:  Left-sided back and flank pain beginning today. EXAM: CT ABDOMEN AND PELVIS WITHOUT CONTRAST TECHNIQUE: Multidetector CT imaging of the abdomen and pelvis was performed following the standard protocol without IV contrast. COMPARISON:  None. FINDINGS: Lower chest: No acute findings. Hepatobiliary: No mass visualized on this unenhanced exam. Moderate diffuse steatosis. Gallbladder is unremarkable. No evidence of biliary ductal dilatation. Pancreas: No mass or  inflammatory process visualized on this unenhanced exam. Spleen:  Within normal limits in size. Adrenals/Urinary tract: 2 mm calculus seen in lower pole of left kidney. No evidence of ureteral calculi or hydronephrosis. Unremarkable unopacified urinary bladder. Stomach/Bowel: No evidence of obstruction, inflammatory process, or abnormal fluid collections. Normal appendix visualized. Vascular/Lymphatic: No pathologically enlarged lymph nodes identified. No evidence of abdominal aortic aneurysm. Reproductive:  No mass or other significant abnormality. Other:  None. Musculoskeletal:  No suspicious bone lesions identified. IMPRESSION: 1. Tiny nonobstructing left renal calculus. No evidence of ureteral calculi, hydronephrosis, or other acute findings. 2. Moderate hepatic steatosis. Electronically Signed   By: Danae OrleansJohn A Stahl M.D.   On: 11/16/2018 04:10    Procedures Procedures (including critical care time)  Medications Ordered in ED Medications  HYDROmorphone (DILAUDID) injection 1 mg (1 mg Intravenous Given 11/16/18 1220)  ketorolac (TORADOL) 30 MG/ML injection 30 mg (30 mg Intravenous Given 11/16/18 1219)  LORazepam (ATIVAN) injection 1 mg (1 mg Intravenous Given 11/16/18 1219)     Initial Impression / Assessment and Plan / ED Course  I have reviewed the triage vital signs and the nursing notes.  Pertinent labs & imaging results that were available during my care of the patient were reviewed by me and considered in my medical decision making (see chart for details).        Patient presents to the ED for evaluation of ongoing back pain.  [facility] and [facility] the same complaints and spent the entire night there with numerous attempts to control his pain which were unsuccessful.  In the ED here he is afebrile, persistently tachycardic but appears uncomfortable.  Symptoms are consistent with radicular pain.  He sees Delbert HarnessMurphy Wainer for his symptoms and recently had a  steroid injection which was helpful until he aggravated his pain by lifting something heavy.  As symptoms have been ongoing for about 5 days now and his blood pressure is normal I have a low suspicion of dissection.  He has some mild generalized abdominal discomfort on palpation but no rebound or peritoneal signs; he did [facility] which  showed no evidence of acute surgical abdominal pathology.  He is tolerating p.o. without difficulty.  I do not feel that he needs any repeat imaging at this time.  He has no red flag signs to suggest cauda equina or spinal abscess.  Lab work that was done at [facility] reviewed independently by myself shows no leukocytosis, no anemia, no metabolic derangements.  He is neurovascularly intact.  He is ambulatory despite pain.  He refused MRI at [facility].  12:23 PM Spoke with Donavan Foil at [facility]. The patient has an appointment to see Dr. Cleophas Dunker at 8:15AM on 9/2 in 2 days.  I have reevaluated the patient who reports that he is still in pain.  Previously he has had muscle relaxers and steroid tapers in the past which have worked for him.  Advised that the steroid taper may cause an increase in his blood sugar and that he should check this regularly at home and discontinue if his sugars increase significantly.  Discussed appropriate use of narcotic pain medicines and muscle relaxants.  We also had an excessive discussion regarding conservative therapy with home exercises, gentle stretching, lidocaine patches, heat therapy.  Recommend follow-up with his orthopedist as scheduled in 2 days.  Discussed strict ED return precautions. Patient verbalized understanding of and agreement with plan and is safe for discharge home at this time.  Discussed with Dr. Adela Lank who agrees with assessment and plan at this time.  Final Clinical Impressions(s) / ED Diagnoses   Final diagnoses:  Acute left-sided low back  pain with left-sided sciatica    ED Discharge Orders         Ordered    cyclobenzaprine (FLEXERIL) 10 MG tablet  2 times daily PRN     11/16/18 1345    lidocaine (LIDODERM) 5 %  Every 24 hours     11/16/18 1345    predniSONE (STERAPRED UNI-PAK 21 TAB) 10 MG (21) TBPK tablet     11/16/18 1345           , [place] A, PA-C 11/16/18 2227    Melene Plan, DO 11/17/18 1504

## 2018-11-16 NOTE — ED Triage Notes (Addendum)
Pt returns with reports of on going left sciatic nerve pain. He reports being at [facility] all night long for care but his pain never got better. He continues to report tingling and numbness in the same extremity.   Addendum: Pt now endorses that he wanted to be discharged from the ER home and didn't want an MRI because he had already had one a month ago by [facility]. He also includes that he picked up the rx for percocet and has already taken 3 this morning but the pain is still there.

## 2018-11-16 NOTE — ED Notes (Signed)
ED Provider at bedside. 

## 2018-11-16 NOTE — ED Notes (Signed)
Continues to report 10/10 pain, no relief from haldol. Pt unable to sit still in bed, obviously uncomfortable. Oxygen saturation down to 87% on room air with good wave form. Placed on 2L [place].

## 2018-11-16 NOTE — ED Notes (Signed)
Patient transported to CT 

## 2018-11-16 NOTE — Discharge Instructions (Addendum)
You were seen today for back pain.  Your symptoms seem consistent with sciatica.  Take medications as prescribed.  Follow-up closely with Weston Anna.  If you develop weakness or bowel or bladder difficulty, you should be reevaluated.

## 2018-11-16 NOTE — ED Notes (Signed)
EDP at bedside, pt now reporting L groin pain. See new orders.

## 2018-11-16 NOTE — ED Notes (Signed)
Pt unable to lay still in bed. Medicated for pain. Awaiting CT scan. VSS 02 sats 99% on RA. HR 87, 151/94

## 2018-11-16 NOTE — ED Provider Notes (Signed)
[facility] EMERGENCY DEPARTMENT Provider Note   CSN: [id number] Arrival date & time: 11/15/18  2325     History   Chief Complaint Chief Complaint  Patient presents with   Back Pain    HPI Isaac Hall is a 48 y.o. male.     HPI  This a 48 year old male with a history of anxiety diabetes who presents with back pain.  Patient reports history of sciatica in the past.  He normally takes anti-inflammatories, gabapentin, muscle relaxers.  He does frequent heavy lifting.  He states that he did heavy lifting on Thursday and has had progressively worsening back pain.  He rates pain at 10 out of 10.  It radiates from his lumbar spine into his left buttock and down his left leg.  He reports numbness and tingling in the legs.  He has had difficulty ambulating but no weakness.  Denies loss of bowel or bladder.  No history of cancer, immunosuppression, fevers.  He does report getting an injection several weeks ago with minimal relief.    Past Medical History:  Diagnosis Date   Anxiety    Diabetes mellitus without complication (HCC)     There are no active problems to display for this patient.   Past Surgical History:  Procedure Laterality Date   VASECTOMY          Home Medications    Prior to Admission medications   Medication Sig Start Date End Date Taking? Authorizing Provider  albuterol (PROVENTIL HFA;VENTOLIN HFA) 108 (90 Base) MCG/ACT inhaler Inhale 2 puffs into the lungs every 6 (six) hours as needed for wheezing or shortness of breath. Patient not taking: Reported on 09/15/2018 08/04/17   Morrell RiddleWeber, Sarah L, PA-C  atorvastatin (LIPITOR) 10 MG tablet Take 1 tablet (10 mg total) by mouth daily. 09/15/18   Georgina QuintSagardia, Miguel Jose, MD  citalopram (CELEXA) 20 MG tablet Take 1 tablet (20 mg total) by mouth daily. 09/15/18 12/14/18  Georgina QuintSagardia, Miguel Jose, MD  cyclobenzaprine (FLEXERIL) 10 MG tablet Take 1 tablet (10 mg total) by mouth at bedtime. 09/15/18   Georgina QuintSagardia,  Miguel Jose, MD  gabapentin (NEURONTIN) 300 MG capsule Take 300 mg by mouth 2 (two) times daily. 09/23/18   [provider]  lisinopril (ZESTRIL) 5 MG tablet Take 1 tablet (5 mg total) by mouth daily. 09/15/18   Georgina QuintSagardia, Miguel Jose, MD  meclizine (ANTIVERT) 25 MG tablet Take 1 tablet (25 mg total) by mouth 3 (three) times daily as needed for dizziness. Patient not taking: Reported on 09/15/2018 08/18/17   Wallis BambergMani, Mario, PA-C  meloxicam (MOBIC) 15 MG tablet TAKE 1 TABLET BY MOUTH EVERY DAY 10/08/18   Georgina QuintSagardia, Miguel Jose, MD  metFORMIN (GLUCOPHAGE) 500 MG tablet Take 1 tablet (500 mg total) by mouth 2 (two) times daily with a meal. 07/29/17   Wallis BambergMani, Mario, PA-C  ondansetron (ZOFRAN-ODT) 8 MG disintegrating tablet Take 1 tablet (8 mg total) by mouth every 8 (eight) hours as needed for nausea. Patient not taking: Reported on 09/15/2018 08/18/17   Wallis BambergMani, Mario, PA-C  [facility]  08/01/17   [provider]  oxyCODONE-acetaminophen (PERCOCET/ROXICET) 5-325 MG tablet Take 1-2 tablets by mouth every 6 (six) hours as needed for severe pain. 11/16/18   , Mayer Maskerourtney F, MD  valACYclovir (VALTREX) 1000 MG tablet Take 1 tablet (1,000 mg total) by mouth 3 (three) times daily. Patient not taking: Reported on 09/15/2018 08/18/17   Wallis BambergMani, Mario, PA-C    Family History Family History  Problem Relation Age of Onset   Cancer Mother    Diabetes Mother    Diabetes Father    Diabetes Sister    Diabetes Sister    Diabetes Sister    Diabetes Sister    Hypertension Sister     Social History Social History   Tobacco Use   Smoking status: Never Smoker   Smokeless tobacco: Never Used  Substance Use Topics   Alcohol use: No   Drug use: No     Allergies   Patient has no known allergies.   Review of Systems Review of Systems  Constitutional: Negative for fever.  Respiratory: Negative for shortness of breath.   Cardiovascular: Negative for chest pain.    Genitourinary: Negative for dysuria and hematuria.  Musculoskeletal: Positive for back pain.  All other systems reviewed and are negative.    Physical Exam Updated Vital Signs BP (!) 143/94    Pulse 83    Temp 98.3 F (36.8 C) (Oral)    Resp (!) 22    Ht 1.626 m (5\' 4" )    Wt 86.2 kg    SpO2 99%    BMI 32.61 kg/m   Physical Exam Vitals signs and nursing note reviewed.  Constitutional:      Appearance: He is well-developed. He is not toxic-appearing.     Comments: Uncomfortable appearing but nontoxic  HENT:     Head: Normocephalic and atraumatic.     Mouth/Throat:     Mouth: Mucous membranes are moist.  Eyes:     Pupils: Pupils are equal, round, and reactive to light.  Neck:     Musculoskeletal: Neck supple.  Cardiovascular:     Rate and Rhythm: Normal rate and regular rhythm.     Heart sounds: Normal heart sounds. No murmur.  Pulmonary:     Effort: Pulmonary effort is normal. No respiratory distress.     Breath sounds: Normal breath sounds. No wheezing.  Abdominal:     General: Bowel sounds are normal.     Palpations: Abdomen is soft.     Tenderness: There is no abdominal tenderness. There is no rebound.  Musculoskeletal:     Comments: Tenderness palpation lower lumbar spine and left gluteus  Lymphadenopathy:     Cervical: No cervical adenopathy.  Skin:    General: Skin is warm and dry.  Neurological:     Mental Status: He is alert and oriented to person, place, and time.     Comments: 5 out of 5 strength bilateral lower extremities, positive left straight leg raise, equal symmetric patellar reflexes bilaterally, no clonus  Psychiatric:        Mood and Affect: Mood normal.      ED Treatments / Results  Labs (all labs ordered are listed, but only abnormal results are displayed) Labs Reviewed  URINALYSIS, ROUTINE W REFLEX MICROSCOPIC - Abnormal; Notable for the following components:      Result Value   Hgb urine dipstick TRACE (*)    All other components  within normal limits  BASIC METABOLIC PANEL - Abnormal; Notable for the following components:   Glucose, Bld 153 (*)    All other components within normal limits  CBG MONITORING, ED - Abnormal; Notable for the following components:   Glucose-Capillary 133 (*)    All other components within normal limits  CBC WITH DIFFERENTIAL/PLATELET  URINALYSIS, MICROSCOPIC (REFLEX)    EKG None  Radiology Ct Renal Stone Study  Result Date: 11/16/2018 CLINICAL DATA:  Left-sided back and flank  pain beginning today. EXAM: CT ABDOMEN AND PELVIS WITHOUT CONTRAST TECHNIQUE: Multidetector CT imaging of the abdomen and pelvis was performed following the standard protocol without IV contrast. COMPARISON:  None. FINDINGS: Lower chest: No acute findings. Hepatobiliary: No mass visualized on this unenhanced exam. Moderate diffuse steatosis. Gallbladder is unremarkable. No evidence of biliary ductal dilatation. Pancreas: No mass or inflammatory process visualized on this unenhanced exam. Spleen:  Within normal limits in size. Adrenals/Urinary tract: 2 mm calculus seen in lower pole of left kidney. No evidence of ureteral calculi or hydronephrosis. Unremarkable unopacified urinary bladder. Stomach/Bowel: No evidence of obstruction, inflammatory process, or abnormal fluid collections. Normal appendix visualized. Vascular/Lymphatic: No pathologically enlarged lymph nodes identified. No evidence of abdominal aortic aneurysm. Reproductive:  No mass or other significant abnormality. Other:  None. Musculoskeletal:  No suspicious bone lesions identified. IMPRESSION: 1. Tiny nonobstructing left renal calculus. No evidence of ureteral calculi, hydronephrosis, or other acute findings. 2. Moderate hepatic steatosis. Electronically Signed   By: Danae OrleansJohn A Stahl M.D.   On: 11/16/2018 04:10    Procedures Procedures (including critical care time)  Medications Ordered in ED Medications  fentaNYL (SUBLIMAZE) 100 MCG/2ML injection (has no  administration in time range)  fentaNYL (SUBLIMAZE) injection 50 mcg (50 mcg Intravenous Given by Other 11/15/18 2341)  gabapentin (NEURONTIN) tablet 600 mg (0 mg Oral Hold 11/16/18 0245)  ketamine (KETALAR) 10 MG/ML injection (has no administration in time range)  dexamethasone (DECADRON) injection 10 mg (10 mg Intramuscular Given 11/16/18 0056)  HYDROmorphone (DILAUDID) injection 1 mg (1 mg Intramuscular Given 11/16/18 0052)  ketorolac (TORADOL) 30 MG/ML injection 30 mg (30 mg Intramuscular Given 11/16/18 0053)  ketamine (KETALAR) injection 26 mg (26 mg Intravenous Given 11/16/18 0245)  gabapentin (NEURONTIN) 300 MG capsule (600 mg  Given 11/16/18 0242)  HYDROmorphone (DILAUDID) injection 2 mg (2 mg Intravenous Given 11/16/18 0337)  haloperidol lactate (HALDOL) injection 2 mg (2 mg Intravenous Given 11/16/18 0446)     Initial Impression / Assessment and Plan / ED Course  I have reviewed the triage vital signs and the nursing notes.  Pertinent labs & imaging results that were available during my care of the patient were reviewed by me and considered in my medical decision making (see chart for details).  Clinical Course as of Nov 16 534  Mon Nov 16, 2018  [id number] On multiple rechecks, patient continues to endorse significant pain.  He appears uncomfortable.  On initial recheck, he was redosed pain medication with ketamine and gabapentin.  He states that nothing has helped.  He has received anti-inflammatory, narcotic, ketamine.  He is now complaining of left groin pain.  No scrotal swelling or testicle pain.  Denies urinary symptoms.  Work-up broadened given ongoing and refractory pain.  Will obtain basic lab work and CT stone study to rule out kidney stone.  Redosed 2 mg of Dilaudid.   [CH]  0441 Patient still endorsing 10 out of 10 pain.  Multiple pain medications used without any relief.  Work-up reviewed and largely reassuring.  CT stone study negative.  Lab work-up and urinalysis reassuring.  Will  attempt Haldol.  If not improving, he may need to have MRI.   [CH]  0532 Patient continues to endorse 10 out of 10 pain.  He appears however much more comfortable and seems to move without as much difficulty.  He was able to bear weight and ambulate with assistance with nursing.  I discussed with the patient that I have very few options left regarding pain  management.  His symptoms appear classic for sciatica.  He reports recently having an MRI through [facility].  I cannot see those results.  I have offered to transfer given refractory pain for repeat MRI versus discharge home with pain medication and follow-up with [facility].  Patient would like to be discharged.   [CH]    Clinical Course User Index [CH] , Barbette Hair, MD       Patient presents with back pain.  Pain is consistent with his prior sciatica but worse.  He has no red flags at this time.  His neurologic exam is somewhat hindered by pain but no clear weakness.  Denies bowel or bladder difficulty.  He is overall nontoxic-appearing.  Just uncomfortable.  Attempted pain management.  See clinical course above.  After multiple medications and additional work-up, patient continued to have pain.  Lab work obtained and does not show any significant leukocytosis.  CT renal stone study obtained given also groin pain and this was negative for obstructing stone.  I gave him the option to go to [facility] for MRI.  He reports that he recently had an MRI with a spinal injection by [facility].  At this time, he would elect to go home even though his pain has been refractory.  He is able to bear weight with assistance.  I have reviewed the narcotic database.  He has no current prescriptions.  Will send home with a course of Percocet.  Will avoid steroids given his history of diabetes.  Recommend additional anti-inflammatories and very close follow-up with his orthopedist.  After history, exam, and medical workup I feel the patient has been  appropriately medically screened and is safe for discharge home. Pertinent diagnoses were discussed with the patient. Patient was given return precautions.  Final Clinical Impressions(s) / ED Diagnoses   Final diagnoses:  Acute left-sided back pain with sciatica    ED Discharge Orders         Ordered    oxyCODONE-acetaminophen (PERCOCET/ROXICET) 5-325 MG tablet  Every 6 hours PRN     11/16/18 0535           Merryl Hacker, MD 11/16/18 [phone]

## 2018-11-17 DIAGNOSIS — M5126 Other intervertebral disc displacement, lumbar region: Secondary | ICD-10-CM | POA: Diagnosis not present

## 2018-11-17 DIAGNOSIS — M545 Low back pain: Secondary | ICD-10-CM | POA: Diagnosis not present

## 2018-11-17 DIAGNOSIS — M5416 Radiculopathy, lumbar region: Secondary | ICD-10-CM | POA: Diagnosis not present

## 2018-11-18 DIAGNOSIS — M545 Low back pain: Secondary | ICD-10-CM | POA: Diagnosis not present

## 2018-11-20 DIAGNOSIS — M5126 Other intervertebral disc displacement, lumbar region: Secondary | ICD-10-CM | POA: Diagnosis not present

## 2018-11-20 DIAGNOSIS — M5137 Other intervertebral disc degeneration, lumbosacral region: Secondary | ICD-10-CM | POA: Diagnosis not present

## 2018-12-03 ENCOUNTER — Other Ambulatory Visit: Payer: Self-pay | Admitting: *Deleted

## 2018-12-03 ENCOUNTER — Other Ambulatory Visit: Payer: Self-pay | Admitting: Neurological Surgery

## 2018-12-30 ENCOUNTER — Ambulatory Visit (INDEPENDENT_AMBULATORY_CARE_PROVIDER_SITE_OTHER): Payer: BC Managed Care – PPO | Admitting: Vascular Surgery

## 2018-12-30 ENCOUNTER — Other Ambulatory Visit: Payer: Self-pay

## 2018-12-30 ENCOUNTER — Encounter: Payer: Self-pay | Admitting: Vascular Surgery

## 2018-12-30 ENCOUNTER — Inpatient Hospital Stay (HOSPITAL_COMMUNITY): Admission: RE | Admit: 2018-12-30 | Payer: BC Managed Care – PPO | Source: Ambulatory Visit

## 2018-12-30 VITALS — BP 122/83 | HR 83 | Temp 97.3°F | Resp 20 | Ht 64.0 in | Wt 190.0 lb

## 2018-12-30 DIAGNOSIS — M5137 Other intervertebral disc degeneration, lumbosacral region: Secondary | ICD-10-CM

## 2018-12-30 DIAGNOSIS — M5136 Other intervertebral disc degeneration, lumbar region: Secondary | ICD-10-CM | POA: Diagnosis not present

## 2018-12-30 NOTE — H&P (View-Only) (Signed)
REASON FOR CONSULT:    To evaluate for anterior retroperitoneal exposure of L5-S1.  The consult is requested by Dr. Danielle Dess.  ASSESSMENT & PLAN:   DEGENERATIVE DISC DISEASE L5-S1: Patient peers to be a good candidate for anterior retroperitoneal exposure of L5-S1.  I have reviewed a CT scan from August of this year I do not see any complicating factors with respect to exposure of the L5-S1 disc space from a vascular standpoint. I have reviewed our role in exposure of the spine in order to allow anterior lumbar interbody fusion at the appropriate levels. We have discussed the potential complications of surgery, including but not limited to, arterial or venous injury, thrombosis, or bleeding. We have also discussed the potential risks of wound healing problems, the development of a hernia, nerve injury, leg swelling, or other unpredictable medical problems. All the patient's questions were answered and they are agreeable to proceed.   Isaac Ferrari, MD, FACS Beeper [phone] Office: [phone]   HPI:   Isaac Hall is a pleasant 48 y.o. male, who has had back pain related to an injury beginning back in June.  He has significant back pain with pain radiating down the left leg.  This is significantly limited his ability to work.  He is failed conservative treatment.  He was evaluated by Dr. Danielle Dess and felt to be a good candidate for anterior lumbar interbody fusion at L5-S1.  We have been asked to provide anterior retroperitoneal exposure.  The patient's risk factors for peripheral vascular disease include diabetes and hypercholesterolemia.  He denies any history of hypertension, family history of premature cardiovascular disease or tobacco use.  He denies any claudication, rest pain, or nonhealing ulcers.  He denies any previous history of DVT.  Past Medical History:  Diagnosis Date  . Anxiety   . Back pain   . Diabetes mellitus without complication (HCC)   . Lower extremity  pain 08/2018    Family History  Problem Relation Age of Onset  . Cancer Mother   . Diabetes Mother   . Diabetes Father   . Diabetes Sister   . Diabetes Sister   . Diabetes Sister   . Diabetes Sister   . Hypertension Sister     SOCIAL HISTORY: Social History   Socioeconomic History  . Marital status: Married    Spouse name: Isaac Hall  . Number of children: 2  . Years of education: 12th grade  . Highest education level: Not on file  Occupational History  . Occupation: MACHINE OPERATOR    Employer: [employer]  Social Needs  . Financial resource strain: Not on file  . Food insecurity    Worry: Not on file    Inability: Not on file  . Transportation needs    Medical: Not on file    Non-medical: Not on file  Tobacco Use  . Smoking status: Never Smoker  . Smokeless tobacco: Never Used  Substance and Sexual Activity  . Alcohol use: No  . Drug use: No  . Sexual activity: Not on file  Lifestyle  . Physical activity    Days per week: Not on file    Minutes per session: Not on file  . Stress: Not on file  Relationships  . Social Musician on phone: Not on file    Gets together: Not on file    Attends religious service: Not on file    Active member of club or organization: Not on file    Attends  meetings of clubs or organizations: Not on file    Relationship status: Not on file  . Intimate partner violence    Fear of current or ex partner: Not on file    Emotionally abused: Not on file    Physically abused: Not on file    Forced sexual activity: Not on file  Other Topics Concern  . Not on file  Social History Narrative   Lives with his wife and their 2 children.   Originally from [place]  Came to [place] in 1995.    No Known Allergies  Current Outpatient Medications  Medication Sig Dispense Refill  . atorvastatin (LIPITOR) 10 MG tablet Take 1 tablet (10 mg total) by mouth daily. 90 tablet 3  . diclofenac (VOLTAREN) 75 MG EC tablet Take 75 mg by  mouth 2 (two) times daily.    Marland Kitchen. gabapentin (NEURONTIN) 300 MG capsule Take 300 mg by mouth 2 (two) times daily.    Marland Kitchen. lisinopril (ZESTRIL) 5 MG tablet Take 1 tablet (5 mg total) by mouth daily. 90 tablet 3  . metFORMIN (GLUCOPHAGE) 500 MG tablet Take 1 tablet (500 mg total) by mouth 2 (two) times daily with a meal. 180 tablet 3  . methocarbamol (ROBAXIN) 500 MG tablet Take 500 mg by mouth 2 (two) times daily.    Letta Pate. ONETOUCH DELICA LANCETS 33G MISC     . tobramycin (TOBREX) 0.3 % ophthalmic solution Place 1 drop into the left eye 2 (two) times daily as needed (stye).     . citalopram (CELEXA) 20 MG tablet Take 1 tablet (20 mg total) by mouth daily. 90 tablet 3  . cyclobenzaprine (FLEXERIL) 10 MG tablet Take 1 tablet (10 mg total) by mouth 2 (two) times daily as needed. (Patient not taking: Reported on 12/28/2018) 10 tablet 0  . lidocaine (LIDODERM) 5 % Place 1 patch onto the skin daily. Remove & Discard patch within 12 hours or as directed by MD (Patient not taking: Reported on 12/28/2018) 30 patch 0  . meloxicam (MOBIC) 15 MG tablet TAKE 1 TABLET BY MOUTH EVERY DAY (Patient not taking: Reported on 12/28/2018) 30 tablet 0  . oxyCODONE-acetaminophen (PERCOCET/ROXICET) 5-325 MG tablet Take 1-2 tablets by mouth every 6 (six) hours as needed for severe pain. (Patient not taking: Reported on 12/28/2018) 15 tablet 0   No current facility-administered medications for this visit.     REVIEW OF SYSTEMS:  [X]  denotes positive finding, [ ]  denotes negative finding Cardiac  Comments:  Chest pain or chest pressure:    Shortness of breath upon exertion:    Short of breath when lying flat:    Irregular heart rhythm:        Vascular    Pain in calf, thigh, or hip brought on by ambulation:    Pain in feet at night that wakes you up from your sleep:     Blood clot in your veins:    Leg swelling:  x       Pulmonary    Oxygen at home:    Productive cough:     Wheezing:         Neurologic    Sudden  weakness in arms or legs:     Sudden numbness in arms or legs:     Sudden onset of difficulty speaking or slurred speech:    Temporary loss of vision in one eye:     Problems with dizziness:         Gastrointestinal  Blood in stool:     Vomited blood:         Genitourinary    Burning when urinating:     Blood in urine:        Psychiatric    Major depression:         Hematologic    Bleeding problems:    Problems with blood clotting too easily:        Skin    Rashes or ulcers:        Constitutional    Fever or chills:     PHYSICAL EXAM:   Vitals:   12/30/18 1030  BP: 122/83  Pulse: 83  Resp: 20  Temp: (!) 97.3 F (36.3 C)  SpO2: 97%  Weight: 190 lb (86.2 kg)  Height: 5\' 4"  (1.626 m)   GENERAL: The patient is a well-nourished male, in no acute distress. The vital signs are documented above. CARDIAC: There is a regular rate and rhythm.  VASCULAR: I do not detect carotid bruits. He has palpable femoral and dorsalis pedis pulses bilaterally. He has no significant lower extremity swelling. PULMONARY: There is good air exchange bilaterally without wheezing or rales. ABDOMEN: Soft and non-tender with normal pitched bowel sounds.  MUSCULOSKELETAL: There are no major deformities or cyanosis. NEUROLOGIC: No focal weakness or paresthesias are detected. SKIN: There are no ulcers or rashes noted. PSYCHIATRIC: The patient has a normal affect.  DATA:    CT ABDOMEN PELVIS: I did review the CT of the abdomen and pelvis that was done to work-up of kidney stone.  I do not see any complicating factors from a vascular standpoint related to exposure of L5-S1.

## 2018-12-30 NOTE — Progress Notes (Signed)
REASON FOR CONSULT:    To evaluate for anterior retroperitoneal exposure of L5-S1.  The consult is requested by Dr. Danielle Dess.  ASSESSMENT & PLAN:   DEGENERATIVE DISC DISEASE L5-S1: Patient peers to be a good candidate for anterior retroperitoneal exposure of L5-S1.  I have reviewed a CT scan from August of this year I do not see any complicating factors with respect to exposure of the L5-S1 disc space from a vascular standpoint. I have reviewed our role in exposure of the spine in order to allow anterior lumbar interbody fusion at the appropriate levels. We have discussed the potential complications of surgery, including but not limited to, arterial or venous injury, thrombosis, or bleeding. We have also discussed the potential risks of wound healing problems, the development of a hernia, nerve injury, leg swelling, or other unpredictable medical problems. All the patient's questions were answered and they are agreeable to proceed.   Isaac Ferrari, MD, FACS Beeper [phone] Office: [phone]   HPI:   Isaac Hall is a pleasant 48 y.o. male, who has had back pain related to an injury beginning back in June.  He has significant back pain with pain radiating down the left leg.  This is significantly limited his ability to work.  He is failed conservative treatment.  He was evaluated by Dr. Danielle Dess and felt to be a good candidate for anterior lumbar interbody fusion at L5-S1.  We have been asked to provide anterior retroperitoneal exposure.  The patient's risk factors for peripheral vascular disease include diabetes and hypercholesterolemia.  He denies any history of hypertension, family history of premature cardiovascular disease or tobacco use.  He denies any claudication, rest pain, or nonhealing ulcers.  He denies any previous history of DVT.  Past Medical History:  Diagnosis Date  . Anxiety   . Back pain   . Diabetes mellitus without complication (HCC)   . Lower extremity  pain 08/2018    Family History  Problem Relation Age of Onset  . Cancer Mother   . Diabetes Mother   . Diabetes Father   . Diabetes Sister   . Diabetes Sister   . Diabetes Sister   . Diabetes Sister   . Hypertension Sister     SOCIAL HISTORY: Social History   Socioeconomic History  . Marital status: Married    Spouse name: Isaac Hall  . Number of children: 2  . Years of education: 12th grade  . Highest education level: Not on file  Occupational History  . Occupation: MACHINE OPERATOR    Employer: [employer]  Social Needs  . Financial resource strain: Not on file  . Food insecurity    Worry: Not on file    Inability: Not on file  . Transportation needs    Medical: Not on file    Non-medical: Not on file  Tobacco Use  . Smoking status: Never Smoker  . Smokeless tobacco: Never Used  Substance and Sexual Activity  . Alcohol use: No  . Drug use: No  . Sexual activity: Not on file  Lifestyle  . Physical activity    Days per week: Not on file    Minutes per session: Not on file  . Stress: Not on file  Relationships  . Social Musician on phone: Not on file    Gets together: Not on file    Attends religious service: Not on file    Active member of club or organization: Not on file    Attends  meetings of clubs or organizations: Not on file    Relationship status: Not on file  . Intimate partner violence    Fear of current or ex partner: Not on file    Emotionally abused: Not on file    Physically abused: Not on file    Forced sexual activity: Not on file  Other Topics Concern  . Not on file  Social History Narrative   Lives with his wife and their 2 children.   Originally from [place].  Came to [place] in 1995.    No Known Allergies  Current Outpatient Medications  Medication Sig Dispense Refill  . atorvastatin (LIPITOR) 10 MG tablet Take 1 tablet (10 mg total) by mouth daily. 90 tablet 3  . diclofenac (VOLTAREN) 75 MG EC tablet Take 75 mg by  mouth 2 (two) times daily.    . gabapentin (NEURONTIN) 300 MG capsule Take 300 mg by mouth 2 (two) times daily.    . lisinopril (ZESTRIL) 5 MG tablet Take 1 tablet (5 mg total) by mouth daily. 90 tablet 3  . metFORMIN (GLUCOPHAGE) 500 MG tablet Take 1 tablet (500 mg total) by mouth 2 (two) times daily with a meal. 180 tablet 3  . methocarbamol (ROBAXIN) 500 MG tablet Take 500 mg by mouth 2 (two) times daily.    . ONETOUCH DELICA LANCETS 33G MISC     . tobramycin (TOBREX) 0.3 % ophthalmic solution Place 1 drop into the left eye 2 (two) times daily as needed (stye).     . citalopram (CELEXA) 20 MG tablet Take 1 tablet (20 mg total) by mouth daily. 90 tablet 3  . cyclobenzaprine (FLEXERIL) 10 MG tablet Take 1 tablet (10 mg total) by mouth 2 (two) times daily as needed. (Patient not taking: Reported on 12/28/2018) 10 tablet 0  . lidocaine (LIDODERM) 5 % Place 1 patch onto the skin daily. Remove & Discard patch within 12 hours or as directed by MD (Patient not taking: Reported on 12/28/2018) 30 patch 0  . meloxicam (MOBIC) 15 MG tablet TAKE 1 TABLET BY MOUTH EVERY DAY (Patient not taking: Reported on 12/28/2018) 30 tablet 0  . oxyCODONE-acetaminophen (PERCOCET/ROXICET) 5-325 MG tablet Take 1-2 tablets by mouth every 6 (six) hours as needed for severe pain. (Patient not taking: Reported on 12/28/2018) 15 tablet 0   No current facility-administered medications for this visit.     REVIEW OF SYSTEMS:  [X] denotes positive finding, [ ] denotes negative finding Cardiac  Comments:  Chest pain or chest pressure:    Shortness of breath upon exertion:    Short of breath when lying flat:    Irregular heart rhythm:        Vascular    Pain in calf, thigh, or hip brought on by ambulation:    Pain in feet at night that wakes you up from your sleep:     Blood clot in your veins:    Leg swelling:  x       Pulmonary    Oxygen at home:    Productive cough:     Wheezing:         Neurologic    Sudden  weakness in arms or legs:     Sudden numbness in arms or legs:     Sudden onset of difficulty speaking or slurred speech:    Temporary loss of vision in one eye:     Problems with dizziness:         Gastrointestinal      Blood in stool:     Vomited blood:         Genitourinary    Burning when urinating:     Blood in urine:        Psychiatric    Major depression:         Hematologic    Bleeding problems:    Problems with blood clotting too easily:        Skin    Rashes or ulcers:        Constitutional    Fever or chills:     PHYSICAL EXAM:   Vitals:   12/30/18 1030  BP: 122/83  Pulse: 83  Resp: 20  Temp: (!) 97.3 F (36.3 C)  SpO2: 97%  Weight: 190 lb (86.2 kg)  Height: 5\' 4"  (1.626 m)   GENERAL: The patient is a well-nourished male, in no acute distress. The vital signs are documented above. CARDIAC: There is a regular rate and rhythm.  VASCULAR: I do not detect carotid bruits. He has palpable femoral and dorsalis pedis pulses bilaterally. He has no significant lower extremity swelling. PULMONARY: There is good air exchange bilaterally without wheezing or rales. ABDOMEN: Soft and non-tender with normal pitched bowel sounds.  MUSCULOSKELETAL: There are no major deformities or cyanosis. NEUROLOGIC: No focal weakness or paresthesias are detected. SKIN: There are no ulcers or rashes noted. PSYCHIATRIC: The patient has a normal affect.  DATA:    CT ABDOMEN PELVIS: I did review the CT of the abdomen and pelvis that was done to work-up of kidney stone.  I do not see any complicating factors from a vascular standpoint related to exposure of L5-S1.

## 2018-12-31 ENCOUNTER — Encounter (HOSPITAL_COMMUNITY): Payer: Self-pay

## 2018-12-31 ENCOUNTER — Other Ambulatory Visit: Payer: Self-pay

## 2018-12-31 ENCOUNTER — Encounter (HOSPITAL_COMMUNITY)
Admission: RE | Admit: 2018-12-31 | Discharge: 2018-12-31 | Disposition: A | Discharge status: Home / Self Care | Payer: BC Managed Care – PPO | Source: Ambulatory Visit | Attending: Neurological Surgery | Admitting: Neurological Surgery

## 2018-12-31 DIAGNOSIS — F419 Anxiety disorder, unspecified: Secondary | ICD-10-CM | POA: Diagnosis not present

## 2018-12-31 DIAGNOSIS — M5126 Other intervertebral disc displacement, lumbar region: Secondary | ICD-10-CM | POA: Diagnosis not present

## 2018-12-31 DIAGNOSIS — M79669 Pain in unspecified lower leg: Secondary | ICD-10-CM | POA: Insufficient documentation

## 2018-12-31 DIAGNOSIS — Z7984 Long term (current) use of oral hypoglycemic drugs: Secondary | ICD-10-CM | POA: Diagnosis not present

## 2018-12-31 DIAGNOSIS — E118 Type 2 diabetes mellitus with unspecified complications: Secondary | ICD-10-CM | POA: Diagnosis not present

## 2018-12-31 DIAGNOSIS — I10 Essential (primary) hypertension: Secondary | ICD-10-CM | POA: Insufficient documentation

## 2018-12-31 DIAGNOSIS — Z791 Long term (current) use of non-steroidal anti-inflammatories (NSAID): Secondary | ICD-10-CM | POA: Diagnosis not present

## 2018-12-31 DIAGNOSIS — Z7901 Long term (current) use of anticoagulants: Secondary | ICD-10-CM | POA: Diagnosis not present

## 2018-12-31 DIAGNOSIS — M549 Dorsalgia, unspecified: Secondary | ICD-10-CM | POA: Insufficient documentation

## 2018-12-31 DIAGNOSIS — Z01818 Encounter for other preprocedural examination: Secondary | ICD-10-CM | POA: Insufficient documentation

## 2018-12-31 HISTORY — DX: Essential (primary) hypertension: I10

## 2018-12-31 HISTORY — DX: Unspecified osteoarthritis, unspecified site: M19.90

## 2018-12-31 LAB — BASIC METABOLIC PANEL
Anion gap: 11 (ref 5–15)
BUN: 13 mg/dL (ref 6–20)
CO2: 22 mmol/L (ref 22–32)
Calcium: 9.9 mg/dL (ref 8.9–10.3)
Chloride: 105 mmol/L (ref 98–111)
Creatinine, Ser: 0.6 mg/dL — ABNORMAL LOW (ref 0.61–1.24)
GFR calc Af Amer: >60 mL/min (ref >60)
GFR calc non Af Amer: >60 mL/min (ref >60)
Glucose, Bld: 115 mg/dL — ABNORMAL HIGH (ref 70–99)
Potassium: 3.8 mmol/L (ref 3.5–5.1)
Sodium: 138 mmol/L (ref 135–145)

## 2018-12-31 LAB — CBC
HCT: 44.2 % (ref 39.0–52.0)
Hemoglobin: 15.4 g/dL (ref 13.0–17.0)
MCH: 31.1 pg (ref 26.0–34.0)
MCHC: 34.8 g/dL (ref 30.0–36.0)
MCV: 89.3 fL (ref 80.0–100.0)
Platelets: 179 10*3/uL (ref 150–400)
RBC: 4.95 MIL/uL (ref 4.22–5.81)
RDW: 12.3 % (ref 11.5–15.5)
WBC: 6.1 10*3/uL (ref 4.0–10.5)
nRBC: 0 % (ref 0.0–0.2)

## 2018-12-31 LAB — TYPE AND SCREEN
ABO/RH(D): O POS
Antibody Screen: NEGATIVE

## 2018-12-31 LAB — SURGICAL PCR SCREEN
MRSA, PCR: NEGATIVE
Staphylococcus aureus: NEGATIVE

## 2018-12-31 LAB — HEMOGLOBIN A1C
Hgb A1c MFr Bld: 8.3 % — ABNORMAL HIGH (ref 4.8–5.6)
Mean Plasma Glucose: 191.51 mg/dL

## 2018-12-31 LAB — ABO/RH: ABO/RH(D): O POS

## 2018-12-31 LAB — GLUCOSE, CAPILLARY: Glucose-Capillary: 140 mg/dL — ABNORMAL HIGH (ref 70–99)

## 2018-12-31 MED ORDER — CHLORHEXIDINE GLUCONATE CLOTH 2 % EX PADS: 6.0000 | MEDICATED_PAD | Freq: Once | CUTANEOUS | Status: DC

## 2018-12-31 NOTE — Progress Notes (Signed)
PCP - MIGUEL SAGARDIA   Cardiologist - C DICKSON  GOT CLEARANCE  -     Chest x-ray - NA EKG -TODAY  Stress Test -  ECHO - NA     Fasting Blood Sugar - 140 Checks Blood Sugar __2___ times a day  Blood Thinner Instructions:NA  Aspirin Instructions:STOP  COVID TEST- 16TH   Anesthesia review: HTN     CLEARANCE DR DICKSON  Patient denies shortness of breath, fever, cough and chest pain at PAT appointment   All instructions explained to the patient, with a verbal understanding of the material. Patient agrees to go over the instructions while at home for a better understanding. Patient also instructed to self quarantine after being tested for COVID-19. The opportunity to ask questions was provided.

## 2018-12-31 NOTE — Pre-Procedure Instructions (Signed)
Isaac Hall  12/31/2018      [id number] - [place], [place] [address] [place] [address] Phone: [phone] Fax: [phone]    Your procedure is scheduled on 01/05/19.  Report to [facility] Admitting at 530 A.M.  Call this number if you have problems the morning of surgery:  [phone]   Remember:  Do not eat or drink after midnight.      Take these medicines the morning of surgery with A SIP OF WATER ---celexa,neurontin,robaxin    Do not wear jewelry, make-up or nail polish.  Do not wear lotions, powders, or perfumes, or deodorant.  Do not shave 48 hours prior to surgery.  Men may shave face and neck.  Do not bring valuables to the hospital.  [facility] is not responsible for any belongings or valuables.  Contacts, dentures or bridgework may not be worn into surgery.  Leave your suitcase in the car.  After surgery it may be brought to your room.  For patients admitted to the hospital, discharge time will be determined by your treatment team.  Patients discharged the day of surgery will not be allowed to drive home.    Special instructions:  Do not take any aspirin,anti-inflammatories,vitamins,or herbal supplements 5-7 days prior to surgery.[facility] - Preparing for Surgery  Before surgery, you can play an important role.  Because skin is not sterile, your skin needs to be as free of germs as possible.  You can reduce the number of germs on you skin by washing with CHG (chlorahexidine gluconate) soap before surgery.  CHG is an antiseptic cleaner which kills germs and bonds with the skin to continue killing germs even after washing.  Oral Hygiene is also important in reducing the risk of infection.  Remember to brush your teeth with your regular toothpaste the morning of surgery.  Please DO NOT use if you have an allergy to CHG or antibacterial soaps.  If your skin  becomes reddened/irritated stop using the CHG and inform your nurse when you arrive at Short Stay.  Do not shave (including legs and underarms) for at least 48 hours prior to the first CHG shower.  You may shave your face.  Please follow these instructions carefully:   1.  Shower with CHG Soap the night before surgery and the morning of Surgery.  2.  If you choose to wash your hair, wash your hair first as usual with your normal shampoo.  3.  After you shampoo, rinse your hair and body thoroughly to remove the shampoo. 4.  Use CHG as you would any other liquid soap.  You can apply chg directly to the skin and wash gently with a      scrungie or washcloth.           5.  Apply the CHG Soap to your body ONLY FROM THE NECK DOWN.   Do not use on open wounds or open sores. Avoid contact with your eyes, ears, mouth and genitals (private parts).  Wash genitals (private parts) with your normal soap.  6.  Wash thoroughly, paying special attention to the area where your surgery will be performed.  7.  Thoroughly rinse your body with warm water from the neck down.  8.  DO NOT shower/wash with your normal soap after using and rinsing off the CHG Soap.  9.  Pat yourself dry with a clean towel.  10.  Wear clean pajamas.            11.  Place clean sheets on your bed the night of your first shower and do not sleep with pets.  Day of Surgery  Do not apply any lotions/deoderants the morning of surgery.   Please wear clean clothes to the hospital/surgery center. Remember to brush your teeth with toothpaste.    Please read over the following fact sheets that you were given. MRSA Information    How to Manage Your Diabetes Before and After Surgery  Why is it important to control my blood sugar before and after surgery? . Improving blood sugar levels before and after surgery helps healing and can limit problems. . A way of improving blood sugar control is eating a healthy diet by: o  Eating  less sugar and carbohydrates o  Increasing activity/exercise o  Talking with your doctor about reaching your blood sugar goals . High blood sugars (greater than 180 mg/dL) can raise your risk of infections and slow your recovery, so you will need to focus on controlling your diabetes during the weeks before surgery. . Make sure that the doctor who takes care of your diabetes knows about your planned surgery including the date and location.  How do I manage my blood sugar before surgery? . Check your blood sugar at least 4 times a day, starting 2 days before surgery, to make sure that the level is not too high or low. o Check your blood sugar the morning of your surgery when you wake up and every 2 hours until you get to the Short Stay unit. . If your blood sugar is less than 70 mg/dL, you will need to treat for low blood sugar: o Do not take insulin. o Treat a low blood sugar (less than 70 mg/dL) with  cup of clear juice (cranberry or apple), 4 glucose tablets, OR glucose gel. Recheck blood sugar in 15 minutes after treatment (to make sure it is greater than 70 mg/dL). If your blood sugar is not greater than 70 mg/dL on recheck, call [phone] o  for further instructions. . Report your blood sugar to the short stay nurse when you get to Short Stay.  . If you are admitted to the hospital after surgery: o Your blood sugar will be checked by the staff and you will probably be given insulin after surgery (instead of oral diabetes medicines) to make sure you have good blood sugar levels. o The goal for blood sugar control after surgery is 80-180 mg/dL.              WHAT DO I DO ABOUT MY DIABETES MEDICATION?   Marland Kitchen Do not take oral diabetes medicines (pills) the morning of surgery.  . THE NIGHT BEFORE SURGERY, take ___________ units of ___________insulin.       . THE MORNING OF SURGERY, take _____________ units of __________insulin.  . The day of surgery, do not take other  diabetes injectables, including Byetta (exenatide), Bydureon (exenatide ER), Victoza (liraglutide), or Trulicity (dulaglutide).  . If your CBG is greater than 220 mg/dL, you may take  of your sliding scale (correction) dose of insulin.  Other Instructions:          Patient Signature:  Date:   Nurse Signature:  Date:   Reviewed and Endorsed by [facility] Patient Education Committee, August 2015

## 2019-01-01 ENCOUNTER — Other Ambulatory Visit (HOSPITAL_COMMUNITY): Payer: BC Managed Care – PPO

## 2019-01-01 ENCOUNTER — Other Ambulatory Visit (HOSPITAL_COMMUNITY)
Admission: RE | Admit: 2019-01-01 | Discharge: 2019-01-01 | Disposition: A | Discharge status: Home / Self Care | Payer: BC Managed Care – PPO | Source: Ambulatory Visit | Attending: Neurological Surgery | Admitting: Neurological Surgery

## 2019-01-01 DIAGNOSIS — Z20828 Contact with and (suspected) exposure to other viral communicable diseases: Secondary | ICD-10-CM | POA: Insufficient documentation

## 2019-01-01 NOTE — Anesthesia Preprocedure Evaluation (Addendum)
Anesthesia Evaluation  Patient identified by MRN, date of birth, ID band Patient awake    Reviewed: Allergy & Precautions, NPO status , Patient's Chart, lab work & pertinent test results  Airway Mallampati: II  TM Distance: >3 FB Neck ROM: Full    Dental no notable dental hx. (+) Teeth Intact, Dental Advisory Given   Pulmonary neg pulmonary ROS,    Pulmonary exam normal breath sounds clear to auscultation       Cardiovascular hypertension, Pt. on medications Normal cardiovascular exam Rhythm:Regular Rate:Normal     Neuro/Psych Anxiety negative neurological ROS     GI/Hepatic negative GI ROS, Neg liver ROS,   Endo/Other  diabetes, Type 2  Renal/GU K 3.8 Cr 0.60     Musculoskeletal  (+) Arthritis ,   Abdominal   Peds  Hematology HGB 15.4 Plt 179   Anesthesia Other Findings   Reproductive/Obstetrics                          Anesthesia Physical Anesthesia Plan  ASA: III  Anesthesia Plan: General   Post-op Pain Management:    Induction: Intravenous  PONV Risk Score and Plan: 3 and Treatment may vary due to age or medical condition, Dexamethasone and Ondansetron  Airway Management Planned: Oral ETT  Additional Equipment:   Intra-op Plan:   Post-operative Plan: Extubation in OR  Informed Consent: I have reviewed the patients History and Physical, chart, labs and discussed the procedure including the risks, benefits and alternatives for the proposed anesthesia with the patient or authorized representative who has indicated his/her understanding and acceptance.     Dental advisory given  Plan Discussed with: CRNA  Anesthesia Plan Comments: (PAT note written by Myra Gianotti, PA-C. GA  )      Anesthesia Quick Evaluation

## 2019-01-01 NOTE — Progress Notes (Signed)
Anesthesia Chart Review:  Case: [id number] Date/Time: 01/05/19 0715   Procedures:      Lumbar 5 Sacral 1 Anterior lumbar interbody fusion (N/A ) - Lumbar 5 Sacral 1 Anterior lumbar interbody fusion     ABDOMINAL EXPOSURE (N/A )   Anesthesia type: General   Pre-op diagnosis: Herniated nucleus pulposus, Lumbar   Location: [facility] OR ROOM 20 / [place] OR   Surgeon: Kristeen Miss, MD; Angelia Mould, MD      DISCUSSION: Patient is a 48 year old male scheduled for the above procedure.  History includes never smoker, DM2, HTN. BMI is consistent with mild obesity.  CBG at PAT 140 and glucose 115 with labs. A1c 8.3%, routed to Dr. Ellene Route. Patient will get fasting CBG on the day of surgery.   Presurgical Covid test is scheduled for 01/01/2019.  Anesthesia team to evaluate on the day of surgery.   VS: BP 126/88   Pulse 89   Temp (!) 36.2 C (Tympanic)   Resp 18   Ht 5\' 4"  (1.626 m)   Wt 82.2 kg   SpO2 96%   BMI 31.11 kg/m   PROVIDERS: Horald Pollen, MD is PCP   LABS: Preoperative labs noted. A1c 8.3. LFTS 09/05/18 were overall unremarkable. ALT 45 (normal range 0-44), AST 33, total bilirubin 0.3.  (all labs ordered are listed, but only abnormal results are displayed)  Labs Reviewed  GLUCOSE, CAPILLARY - Abnormal; Notable for the following components:      Result Value   Glucose-Capillary 140 (*)    All other components within normal limits  BASIC METABOLIC PANEL - Abnormal; Notable for the following components:   Glucose, Bld 115 (*)    Creatinine, Ser 0.60 (*)    All other components within normal limits  HEMOGLOBIN A1C - Abnormal; Notable for the following components:   Hgb A1c MFr Bld 8.3 (*)    All other components within normal limits  SURGICAL PCR SCREEN  CBC  TYPE AND SCREEN  ABO/RH    EKG: 12/31/18: NSR - baseline artifact in V3   CV: N/A   Past Medical History:  Diagnosis Date  . Anxiety   . Arthritis   . Back pain   . Diabetes mellitus without  complication ([place])   . Hypertension   . Lower extremity pain 08/2018    Past Surgical History:  Procedure Laterality Date  . VASECTOMY      MEDICATIONS: . atorvastatin (LIPITOR) 10 MG tablet  . citalopram (CELEXA) 20 MG tablet  . cyclobenzaprine (FLEXERIL) 10 MG tablet  . diclofenac (VOLTAREN) 75 MG EC tablet  . gabapentin (NEURONTIN) 300 MG capsule  . lidocaine (LIDODERM) 5 %  . lisinopril (ZESTRIL) 5 MG tablet  . meloxicam (MOBIC) 15 MG tablet  . metFORMIN (GLUCOPHAGE) 500 MG tablet  . methocarbamol (ROBAXIN) 500 MG tablet  . ONETOUCH DELICA LANCETS 95A MISC  . oxyCODONE-acetaminophen (PERCOCET/ROXICET) 5-325 MG tablet  . tobramycin (TOBREX) 0.3 % ophthalmic solution   No current facility-administered medications for this encounter.     Myra Gianotti, PA-C Surgical Short Stay/Anesthesiology [facility] Phone [phone] [facility] Phone [phone] 01/01/2019 1:13 PM

## 2019-01-02 LAB — NOVEL CORONAVIRUS, NAA (HOSP ORDER, SEND-OUT TO REF LAB; TAT 18-24 HRS): SARS-CoV-2, NAA: NOT DETECTED

## 2019-01-05 ENCOUNTER — Inpatient Hospital Stay (HOSPITAL_COMMUNITY): Payer: BC Managed Care – PPO | Admitting: Anesthesiology

## 2019-01-05 ENCOUNTER — Inpatient Hospital Stay (HOSPITAL_COMMUNITY): Payer: BC Managed Care – PPO

## 2019-01-05 ENCOUNTER — Other Ambulatory Visit: Payer: Self-pay

## 2019-01-05 ENCOUNTER — Encounter (HOSPITAL_COMMUNITY): Payer: Self-pay

## 2019-01-05 ENCOUNTER — Inpatient Hospital Stay (HOSPITAL_COMMUNITY): Payer: BC Managed Care – PPO | Admitting: Vascular Surgery

## 2019-01-05 ENCOUNTER — Encounter (HOSPITAL_COMMUNITY)
Admission: RE | Disposition: A | Discharge status: Home / Self Care | Payer: Self-pay | Source: Home / Self Care | Attending: Neurological Surgery

## 2019-01-05 ENCOUNTER — Inpatient Hospital Stay (HOSPITAL_COMMUNITY)
Admission: RE | Admit: 2019-01-05 | Discharge: 2019-01-06 | DRG: 460 | Disposition: A | Discharge status: Home / Self Care | Payer: BC Managed Care – PPO | Attending: Neurological Surgery | Admitting: Neurological Surgery

## 2019-01-05 DIAGNOSIS — M21372 Foot drop, left foot: Secondary | ICD-10-CM | POA: Diagnosis present

## 2019-01-05 DIAGNOSIS — M47897 Other spondylosis, lumbosacral region: Secondary | ICD-10-CM | POA: Diagnosis present

## 2019-01-05 DIAGNOSIS — M5127 Other intervertebral disc displacement, lumbosacral region: Secondary | ICD-10-CM | POA: Diagnosis present

## 2019-01-05 DIAGNOSIS — F419 Anxiety disorder, unspecified: Secondary | ICD-10-CM | POA: Diagnosis not present

## 2019-01-05 DIAGNOSIS — Z79899 Other long term (current) drug therapy: Secondary | ICD-10-CM | POA: Diagnosis not present

## 2019-01-05 DIAGNOSIS — Z79891 Long term (current) use of opiate analgesic: Secondary | ICD-10-CM | POA: Diagnosis not present

## 2019-01-05 DIAGNOSIS — M1990 Unspecified osteoarthritis, unspecified site: Secondary | ICD-10-CM | POA: Diagnosis present

## 2019-01-05 DIAGNOSIS — Z833 Family history of diabetes mellitus: Secondary | ICD-10-CM

## 2019-01-05 DIAGNOSIS — Z419 Encounter for procedure for purposes other than remedying health state, unspecified: Secondary | ICD-10-CM

## 2019-01-05 DIAGNOSIS — I10 Essential (primary) hypertension: Secondary | ICD-10-CM | POA: Diagnosis not present

## 2019-01-05 DIAGNOSIS — M5117 Intervertebral disc disorders with radiculopathy, lumbosacral region: Secondary | ICD-10-CM | POA: Diagnosis not present

## 2019-01-05 DIAGNOSIS — M4326 Fusion of spine, lumbar region: Secondary | ICD-10-CM | POA: Diagnosis not present

## 2019-01-05 DIAGNOSIS — E119 Type 2 diabetes mellitus without complications: Secondary | ICD-10-CM | POA: Diagnosis not present

## 2019-01-05 DIAGNOSIS — Z791 Long term (current) use of non-steroidal anti-inflammatories (NSAID): Secondary | ICD-10-CM | POA: Diagnosis not present

## 2019-01-05 DIAGNOSIS — Z20828 Contact with and (suspected) exposure to other viral communicable diseases: Secondary | ICD-10-CM | POA: Diagnosis present

## 2019-01-05 DIAGNOSIS — Z809 Family history of malignant neoplasm, unspecified: Secondary | ICD-10-CM

## 2019-01-05 DIAGNOSIS — Z7984 Long term (current) use of oral hypoglycemic drugs: Secondary | ICD-10-CM

## 2019-01-05 DIAGNOSIS — Z0389 Encounter for observation for other suspected diseases and conditions ruled out: Secondary | ICD-10-CM | POA: Diagnosis not present

## 2019-01-05 DIAGNOSIS — M4727 Other spondylosis with radiculopathy, lumbosacral region: Secondary | ICD-10-CM | POA: Diagnosis not present

## 2019-01-05 HISTORY — PX: ANTERIOR LUMBAR FUSION: SHX1170

## 2019-01-05 HISTORY — PX: ABDOMINAL EXPOSURE: SHX5708

## 2019-01-05 LAB — GLUCOSE, CAPILLARY
Glucose-Capillary: 137 mg/dL — ABNORMAL HIGH (ref 70–99)
Glucose-Capillary: 191 mg/dL — ABNORMAL HIGH (ref 70–99)
Glucose-Capillary: 169 mg/dL — ABNORMAL HIGH (ref 70–99)
Glucose-Capillary: 191 mg/dL — ABNORMAL HIGH (ref 70–99)
Glucose-Capillary: 215 mg/dL — ABNORMAL HIGH (ref 70–99)

## 2019-01-05 SURGERY — ANTERIOR LUMBAR FUSION 1 LEVEL
Anesthesia: General

## 2019-01-05 MED ORDER — BUPIVACAINE HCL (PF) 0.5 % IJ SOLN
INTRAMUSCULAR | Status: DC | PRN
  Administered 2019-01-05: 10 mL

## 2019-01-05 MED ORDER — ALBUMIN HUMAN 5 % IV SOLN
12.5000 g | Freq: Once | INTRAVENOUS | Status: AC
  Administered 2019-01-05: 12.5 g via INTRAVENOUS

## 2019-01-05 MED ORDER — PROPOFOL 10 MG/ML IV BOLUS
INTRAVENOUS | Status: DC | PRN
  Administered 2019-01-05: 170 mg via INTRAVENOUS
  Administered 2019-01-05: 30 mg via INTRAVENOUS

## 2019-01-05 MED ORDER — MEPERIDINE HCL 25 MG/ML IJ SOLN: 6.2500 mg | INTRAMUSCULAR | Status: DC | PRN

## 2019-01-05 MED ORDER — SENNA 8.6 MG PO TABS
1.0000 | ORAL_TABLET | Freq: Two times a day (BID) | ORAL | Status: DC
  Administered 2019-01-05 – 2019-01-06 (×2): 8.6 mg via ORAL
  Filled 2019-01-05 (×2): qty 1

## 2019-01-05 MED ORDER — LIDOCAINE 2% (20 MG/ML) 5 ML SYRINGE
INTRAMUSCULAR | Status: DC | PRN
  Administered 2019-01-05: 60 mg via INTRAVENOUS

## 2019-01-05 MED ORDER — LACTATED RINGERS IV SOLN
INTRAVENOUS | Status: DC | PRN
  Administered 2019-01-05 (×2): via INTRAVENOUS

## 2019-01-05 MED ORDER — SODIUM CHLORIDE 0.9% FLUSH
3.0000 mL | Freq: Two times a day (BID) | INTRAVENOUS | Status: DC
  Administered 2019-01-05: 21:00:00 3 mL via INTRAVENOUS

## 2019-01-05 MED ORDER — PHENYLEPHRINE 40 MCG/ML (10ML) SYRINGE FOR IV PUSH (FOR BLOOD PRESSURE SUPPORT)
PREFILLED_SYRINGE | INTRAVENOUS | Status: DC | PRN
  Administered 2019-01-05: 40 ug via INTRAVENOUS
  Administered 2019-01-05: 80 ug via INTRAVENOUS

## 2019-01-05 MED ORDER — CITALOPRAM HYDROBROMIDE 20 MG PO TABS
20.0000 mg | ORAL_TABLET | Freq: Every day | ORAL | Status: DC
  Administered 2019-01-06: 20 mg via ORAL
  Filled 2019-01-05: qty 1

## 2019-01-05 MED ORDER — SUCCINYLCHOLINE CHLORIDE 200 MG/10ML IV SOSY
PREFILLED_SYRINGE | INTRAVENOUS | Status: AC
  Filled 2019-01-05: qty 10

## 2019-01-05 MED ORDER — ACETAMINOPHEN 650 MG RE SUPP: 650.0000 mg | RECTAL | Status: DC | PRN

## 2019-01-05 MED ORDER — PROPOFOL 10 MG/ML IV BOLUS
INTRAVENOUS | Status: AC
  Filled 2019-01-05: qty 20

## 2019-01-05 MED ORDER — SODIUM CHLORIDE 0.9% FLUSH: 3.0000 mL | INTRAVENOUS | Status: DC | PRN

## 2019-01-05 MED ORDER — MIDAZOLAM HCL 5 MG/5ML IJ SOLN
INTRAMUSCULAR | Status: DC | PRN
  Administered 2019-01-05: 2 mg via INTRAVENOUS

## 2019-01-05 MED ORDER — BUPIVACAINE HCL (PF) 0.5 % IJ SOLN
INTRAMUSCULAR | Status: AC
  Filled 2019-01-05: qty 30

## 2019-01-05 MED ORDER — SODIUM CHLORIDE 0.9 % IV SOLN
INTRAVENOUS | Status: DC | PRN
  Administered 2019-01-05: 500 mL

## 2019-01-05 MED ORDER — MORPHINE SULFATE (PF) 2 MG/ML IV SOLN: 2.0000 mg | INTRAVENOUS | Status: DC | PRN

## 2019-01-05 MED ORDER — HYDROMORPHONE HCL 1 MG/ML IJ SOLN
INTRAMUSCULAR | Status: AC
  Administered 2019-01-05: 0.25 mg via INTRAVENOUS
  Filled 2019-01-05: qty 1

## 2019-01-05 MED ORDER — LISINOPRIL 10 MG PO TABS
5.0000 mg | ORAL_TABLET | Freq: Every day | ORAL | Status: DC
  Administered 2019-01-06: 09:00:00 5 mg via ORAL
  Filled 2019-01-05: qty 1

## 2019-01-05 MED ORDER — INFLUENZA VAC SPLIT QUAD 0.5 ML IM SUSY
0.5000 mL | PREFILLED_SYRINGE | INTRAMUSCULAR | Status: DC
  Filled 2019-01-05: qty 0.5

## 2019-01-05 MED ORDER — PROMETHAZINE HCL 25 MG/ML IJ SOLN: 6.2500 mg | INTRAMUSCULAR | Status: DC | PRN

## 2019-01-05 MED ORDER — DOCUSATE SODIUM 100 MG PO CAPS
100.0000 mg | ORAL_CAPSULE | Freq: Two times a day (BID) | ORAL | Status: DC
  Administered 2019-01-05 – 2019-01-06 (×2): 100 mg via ORAL
  Filled 2019-01-05 (×2): qty 1

## 2019-01-05 MED ORDER — MIDAZOLAM HCL 2 MG/2ML IJ SOLN
INTRAMUSCULAR | Status: AC
  Filled 2019-01-05: qty 2

## 2019-01-05 MED ORDER — POLYETHYLENE GLYCOL 3350 17 G PO PACK: 17.0000 g | PACK | Freq: Every day | ORAL | Status: DC | PRN

## 2019-01-05 MED ORDER — ATORVASTATIN CALCIUM 10 MG PO TABS
10.0000 mg | ORAL_TABLET | Freq: Every day | ORAL | Status: DC
  Administered 2019-01-05: 10 mg via ORAL
  Filled 2019-01-05: qty 1

## 2019-01-05 MED ORDER — ONDANSETRON HCL 4 MG/2ML IJ SOLN: 4.0000 mg | Freq: Four times a day (QID) | INTRAMUSCULAR | Status: DC | PRN

## 2019-01-05 MED ORDER — METHOCARBAMOL 500 MG PO TABS
500.0000 mg | ORAL_TABLET | Freq: Two times a day (BID) | ORAL | Status: DC
  Administered 2019-01-05 – 2019-01-06 (×2): 500 mg via ORAL
  Filled 2019-01-05 (×2): qty 1

## 2019-01-05 MED ORDER — GLYCOPYRROLATE PF 0.2 MG/ML IJ SOSY
PREFILLED_SYRINGE | INTRAMUSCULAR | Status: DC | PRN
  Administered 2019-01-05: .2 mg via INTRAVENOUS

## 2019-01-05 MED ORDER — FENTANYL CITRATE (PF) 250 MCG/5ML IJ SOLN
INTRAMUSCULAR | Status: AC
  Filled 2019-01-05: qty 5

## 2019-01-05 MED ORDER — ALBUMIN HUMAN 5 % IV SOLN
INTRAVENOUS | Status: DC | PRN
  Administered 2019-01-05: 10:00:00 via INTRAVENOUS

## 2019-01-05 MED ORDER — METHOCARBAMOL 500 MG PO TABS
500.0000 mg | ORAL_TABLET | Freq: Four times a day (QID) | ORAL | Status: DC | PRN
  Administered 2019-01-05: 500 mg via ORAL

## 2019-01-05 MED ORDER — SUGAMMADEX SODIUM 200 MG/2ML IV SOLN
INTRAVENOUS | Status: DC | PRN
  Administered 2019-01-05: 200 mg via INTRAVENOUS

## 2019-01-05 MED ORDER — THROMBIN 5000 UNITS EX SOLR
CUTANEOUS | Status: AC
  Filled 2019-01-05: qty 5000

## 2019-01-05 MED ORDER — METFORMIN HCL 500 MG PO TABS
500.0000 mg | ORAL_TABLET | Freq: Two times a day (BID) | ORAL | Status: DC
  Administered 2019-01-05 – 2019-01-06 (×2): 500 mg via ORAL
  Filled 2019-01-05 (×2): qty 1

## 2019-01-05 MED ORDER — HYDROMORPHONE HCL 1 MG/ML IJ SOLN
INTRAMUSCULAR | Status: AC
  Filled 2019-01-05: qty 0.5

## 2019-01-05 MED ORDER — GABAPENTIN 300 MG PO CAPS
300.0000 mg | ORAL_CAPSULE | Freq: Two times a day (BID) | ORAL | Status: DC
  Administered 2019-01-05 – 2019-01-06 (×3): 300 mg via ORAL
  Filled 2019-01-05 (×3): qty 1

## 2019-01-05 MED ORDER — ALBUMIN HUMAN 5 % IV SOLN
INTRAVENOUS | Status: AC
  Filled 2019-01-05: qty 250

## 2019-01-05 MED ORDER — HYDROMORPHONE HCL 1 MG/ML IJ SOLN
0.2500 mg | INTRAMUSCULAR | Status: DC | PRN
  Administered 2019-01-05: 0.25 mg via INTRAVENOUS
  Administered 2019-01-05: 0.5 mg via INTRAVENOUS
  Administered 2019-01-05: 12:00:00 0.25 mg via INTRAVENOUS

## 2019-01-05 MED ORDER — ACETAMINOPHEN 10 MG/ML IV SOLN
INTRAVENOUS | Status: AC
  Administered 2019-01-05: 11:00:00 1000 mg via INTRAVENOUS
  Filled 2019-01-05: qty 100

## 2019-01-05 MED ORDER — METHOCARBAMOL 1000 MG/10ML IJ SOLN
500.0000 mg | Freq: Four times a day (QID) | INTRAVENOUS | Status: DC | PRN
  Filled 2019-01-05: qty 5

## 2019-01-05 MED ORDER — INSULIN ASPART 100 UNIT/ML ~~LOC~~ SOLN
0.0000 [IU] | Freq: Three times a day (TID) | SUBCUTANEOUS | Status: DC
  Administered 2019-01-05: 3 [IU] via SUBCUTANEOUS
  Administered 2019-01-06: 2 [IU] via SUBCUTANEOUS

## 2019-01-05 MED ORDER — DEXAMETHASONE SODIUM PHOSPHATE 10 MG/ML IJ SOLN
INTRAMUSCULAR | Status: AC
  Filled 2019-01-05: qty 1

## 2019-01-05 MED ORDER — ONDANSETRON HCL 4 MG PO TABS: 4.0000 mg | ORAL_TABLET | Freq: Four times a day (QID) | ORAL | Status: DC | PRN

## 2019-01-05 MED ORDER — LACTATED RINGERS IV SOLN
INTRAVENOUS | Status: DC | PRN
  Administered 2019-01-05: 08:00:00 via INTRAVENOUS

## 2019-01-05 MED ORDER — ROCURONIUM BROMIDE 10 MG/ML (PF) SYRINGE
PREFILLED_SYRINGE | INTRAVENOUS | Status: AC
  Filled 2019-01-05: qty 30

## 2019-01-05 MED ORDER — SUCCINYLCHOLINE CHLORIDE 200 MG/10ML IV SOSY
PREFILLED_SYRINGE | INTRAVENOUS | Status: DC | PRN
  Administered 2019-01-05: 80 mg via INTRAVENOUS

## 2019-01-05 MED ORDER — LACTATED RINGERS IV SOLN: INTRAVENOUS | Status: DC

## 2019-01-05 MED ORDER — THROMBIN 5000 UNITS EX SOLR
OROMUCOSAL | Status: DC | PRN
  Administered 2019-01-05: 07:00:00 5 mL via TOPICAL

## 2019-01-05 MED ORDER — BISACODYL 10 MG RE SUPP: 10.0000 mg | Freq: Every day | RECTAL | Status: DC | PRN

## 2019-01-05 MED ORDER — LIDOCAINE-EPINEPHRINE 1 %-1:100000 IJ SOLN
INTRAMUSCULAR | Status: AC
  Filled 2019-01-05: qty 1

## 2019-01-05 MED ORDER — KETOROLAC TROMETHAMINE 15 MG/ML IJ SOLN
15.0000 mg | Freq: Four times a day (QID) | INTRAMUSCULAR | Status: AC
  Administered 2019-01-05 – 2019-01-06 (×4): 15 mg via INTRAVENOUS
  Filled 2019-01-05 (×4): qty 1

## 2019-01-05 MED ORDER — SODIUM CHLORIDE 0.9 % IV SOLN
INTRAVENOUS | Status: DC | PRN
  Administered 2019-01-05: 08:00:00 20 ug/min via INTRAVENOUS

## 2019-01-05 MED ORDER — GLYCOPYRROLATE PF 0.2 MG/ML IJ SOSY
PREFILLED_SYRINGE | INTRAMUSCULAR | Status: AC
  Filled 2019-01-05: qty 1

## 2019-01-05 MED ORDER — OXYCODONE-ACETAMINOPHEN 5-325 MG PO TABS
1.0000 | ORAL_TABLET | ORAL | Status: DC | PRN
  Administered 2019-01-05: 1 via ORAL
  Administered 2019-01-06 (×2): 2 via ORAL
  Filled 2019-01-05: qty 1
  Filled 2019-01-05 (×3): qty 2

## 2019-01-05 MED ORDER — ONDANSETRON HCL 4 MG/2ML IJ SOLN
INTRAMUSCULAR | Status: DC | PRN
  Administered 2019-01-05: 4 mg via INTRAVENOUS

## 2019-01-05 MED ORDER — 0.9 % SODIUM CHLORIDE (POUR BTL) OPTIME
TOPICAL | Status: DC | PRN
  Administered 2019-01-05: 1000 mL

## 2019-01-05 MED ORDER — KETAMINE HCL 10 MG/ML IJ SOLN
INTRAMUSCULAR | Status: DC | PRN
  Administered 2019-01-05: 20 mg via INTRAVENOUS
  Administered 2019-01-05: 10 mg via INTRAVENOUS
  Administered 2019-01-05: 20 mg via INTRAVENOUS

## 2019-01-05 MED ORDER — ACETAMINOPHEN 10 MG/ML IV SOLN
1000.0000 mg | Freq: Once | INTRAVENOUS | Status: DC | PRN
  Administered 2019-01-05: 11:00:00 1000 mg via INTRAVENOUS

## 2019-01-05 MED ORDER — METHOCARBAMOL 500 MG PO TABS
ORAL_TABLET | ORAL | Status: AC
  Filled 2019-01-05: qty 1

## 2019-01-05 MED ORDER — THROMBIN 20000 UNITS EX SOLR
CUTANEOUS | Status: DC | PRN
  Administered 2019-01-05: 20 mL via TOPICAL

## 2019-01-05 MED ORDER — CHLORHEXIDINE GLUCONATE 4 % EX LIQD: 60.0000 mL | Freq: Once | CUTANEOUS | Status: DC

## 2019-01-05 MED ORDER — PHENOL 1.4 % MT LIQD: 1.0000 | OROMUCOSAL | Status: DC | PRN

## 2019-01-05 MED ORDER — KETAMINE HCL 50 MG/5ML IJ SOSY
PREFILLED_SYRINGE | INTRAMUSCULAR | Status: AC
  Filled 2019-01-05: qty 5

## 2019-01-05 MED ORDER — HYDROCODONE-ACETAMINOPHEN 7.5-325 MG PO TABS
1.0000 | ORAL_TABLET | Freq: Once | ORAL | Status: AC | PRN
  Administered 2019-01-05: 12:00:00 1 via ORAL

## 2019-01-05 MED ORDER — HYDROMORPHONE HCL 1 MG/ML IJ SOLN
INTRAMUSCULAR | Status: DC | PRN
  Administered 2019-01-05: 0.5 mg via INTRAVENOUS

## 2019-01-05 MED ORDER — LIDOCAINE 2% (20 MG/ML) 5 ML SYRINGE
INTRAMUSCULAR | Status: AC
  Filled 2019-01-05: qty 5

## 2019-01-05 MED ORDER — PHENYLEPHRINE 40 MCG/ML (10ML) SYRINGE FOR IV PUSH (FOR BLOOD PRESSURE SUPPORT)
PREFILLED_SYRINGE | INTRAVENOUS | Status: AC
  Filled 2019-01-05: qty 10

## 2019-01-05 MED ORDER — ALUM & MAG HYDROXIDE-SIMETH 200-200-20 MG/5ML PO SUSP: 30.0000 mL | Freq: Four times a day (QID) | ORAL | Status: DC | PRN

## 2019-01-05 MED ORDER — EPHEDRINE 5 MG/ML INJ
INTRAVENOUS | Status: AC
  Filled 2019-01-05: qty 10

## 2019-01-05 MED ORDER — THROMBIN 20000 UNITS EX SOLR
CUTANEOUS | Status: AC
  Filled 2019-01-05: qty 20000

## 2019-01-05 MED ORDER — DEXAMETHASONE SODIUM PHOSPHATE 10 MG/ML IJ SOLN
INTRAMUSCULAR | Status: DC | PRN
  Administered 2019-01-05: 4 mg via INTRAVENOUS

## 2019-01-05 MED ORDER — FENTANYL CITRATE (PF) 100 MCG/2ML IJ SOLN
INTRAMUSCULAR | Status: DC | PRN
  Administered 2019-01-05: 200 ug via INTRAVENOUS
  Administered 2019-01-05: 50 ug via INTRAVENOUS

## 2019-01-05 MED ORDER — FLEET ENEMA 7-19 GM/118ML RE ENEM: 1.0000 | ENEMA | Freq: Once | RECTAL | Status: DC | PRN

## 2019-01-05 MED ORDER — TOBRAMYCIN 0.3 % OP SOLN
1.0000 [drp] | Freq: Two times a day (BID) | OPHTHALMIC | Status: DC | PRN
  Filled 2019-01-05: qty 5

## 2019-01-05 MED ORDER — CEFAZOLIN SODIUM-DEXTROSE 2-4 GM/100ML-% IV SOLN
2.0000 g | INTRAVENOUS | Status: AC
  Administered 2019-01-05: 2 g via INTRAVENOUS
  Filled 2019-01-05: qty 100

## 2019-01-05 MED ORDER — ROCURONIUM BROMIDE 10 MG/ML (PF) SYRINGE
PREFILLED_SYRINGE | INTRAVENOUS | Status: DC | PRN
  Administered 2019-01-05: 60 mg via INTRAVENOUS
  Administered 2019-01-05 (×2): 20 mg via INTRAVENOUS

## 2019-01-05 MED ORDER — MENTHOL 3 MG MT LOZG: 1.0000 | LOZENGE | OROMUCOSAL | Status: DC | PRN

## 2019-01-05 MED ORDER — HYDROCODONE-ACETAMINOPHEN 7.5-325 MG PO TABS
ORAL_TABLET | ORAL | Status: AC
  Filled 2019-01-05: qty 1

## 2019-01-05 MED ORDER — ACETAMINOPHEN 325 MG PO TABS: 650.0000 mg | ORAL_TABLET | ORAL | Status: DC | PRN

## 2019-01-05 MED ORDER — ONDANSETRON HCL 4 MG/2ML IJ SOLN
INTRAMUSCULAR | Status: AC
  Filled 2019-01-05: qty 2

## 2019-01-05 SURGICAL SUPPLY — 88 items
ADH SKN CLS APL DERMABOND .7 (GAUZE/BANDAGES/DRESSINGS) ×1
APPLIER CLIP 11 MED OPEN (CLIP) ×6
APR CLP MED 11 20 MLT OPN (CLIP) ×2
BAG DECANTER FOR FLEXI CONT (MISCELLANEOUS) ×3 IMPLANT
BASE TI IMP 6X42X30 15D (Cage) ×2 IMPLANT
BASKET BONE COLLECTION (BASKET) IMPLANT
BOLT BASE TI 5.0X25 VARIABLE (Bolt) ×2 IMPLANT
BOLT BASE TI 5.0X25MM VARIABLE (Bolt) ×2 IMPLANT
BOLT BASE TI 5X20 VARIABLE (Bolt) ×2 IMPLANT
BONE MATRIX OSTEOCEL PRO MED (Bone Implant) ×2 IMPLANT
BUR BARREL STRAIGHT FLUTE 4.0 (BURR) IMPLANT
BUR MATCHSTICK NEURO 3.0 LAGG (BURR) IMPLANT
CANISTER SUCT 3000ML PPV (MISCELLANEOUS) ×3 IMPLANT
CLIP APPLIE 11 MED OPEN (CLIP) ×2 IMPLANT
COVER BACK TABLE 60X90IN (DRAPES) ×6 IMPLANT
COVER WAND RF STERILE (DRAPES) ×6 IMPLANT
DECANTER SPIKE VIAL GLASS SM (MISCELLANEOUS) ×3 IMPLANT
DERMABOND ADVANCED (GAUZE/BANDAGES/DRESSINGS) ×2
DERMABOND ADVANCED .7 DNX12 (GAUZE/BANDAGES/DRESSINGS) ×2 IMPLANT
DRAPE C-ARM 42X72 X-RAY (DRAPES) ×3 IMPLANT
DRAPE C-ARMOR (DRAPES) ×3 IMPLANT
DRAPE INCISE IOBAN 66X45 STRL (DRAPES) ×3 IMPLANT
DRAPE LAPAROTOMY 100X72X124 (DRAPES) ×3 IMPLANT
DURAPREP 26ML APPLICATOR (WOUND CARE) ×3 IMPLANT
ELECT BLADE 4.0 EZ CLEAN MEGAD (MISCELLANEOUS) ×3
ELECT REM PT RETURN 9FT ADLT (ELECTROSURGICAL) ×3
ELECTRODE BLDE 4.0 EZ CLN MEGD (MISCELLANEOUS) ×1 IMPLANT
ELECTRODE REM PT RTRN 9FT ADLT (ELECTROSURGICAL) ×1 IMPLANT
GAUZE 4X4 16PLY RFD (DISPOSABLE) IMPLANT
GLOVE BIOGEL PI IND STRL 7.5 (GLOVE) IMPLANT
GLOVE BIOGEL PI IND STRL 8 (GLOVE) ×1 IMPLANT
GLOVE BIOGEL PI IND STRL 8.5 (GLOVE) IMPLANT
GLOVE BIOGEL PI INDICATOR 7.5 (GLOVE)
GLOVE BIOGEL PI INDICATOR 8 (GLOVE) ×2
GLOVE BIOGEL PI INDICATOR 8.5 (GLOVE)
GLOVE ECLIPSE 7.5 STRL STRAW (GLOVE) ×3 IMPLANT
GLOVE ECLIPSE 8.5 STRL (GLOVE) ×3 IMPLANT
GOWN STRL REUS W/ TWL LRG LVL3 (GOWN DISPOSABLE) ×4 IMPLANT
GOWN STRL REUS W/ TWL XL LVL3 (GOWN DISPOSABLE) ×1 IMPLANT
GOWN STRL REUS W/TWL 2XL LVL3 (GOWN DISPOSABLE) ×3 IMPLANT
GOWN STRL REUS W/TWL LRG LVL3 (GOWN DISPOSABLE) ×12
GOWN STRL REUS W/TWL XL LVL3 (GOWN DISPOSABLE) ×3
HEMOSTAT POWDER KIT SURGIFOAM (HEMOSTASIS) IMPLANT
HEMOSTAT POWDER SURGIFOAM 1G (HEMOSTASIS) ×2 IMPLANT
INSERT FOGARTY 61MM (MISCELLANEOUS) IMPLANT
INSERT FOGARTY SM (MISCELLANEOUS) IMPLANT
KIT BASIN OR (CUSTOM PROCEDURE TRAY) ×3 IMPLANT
KIT TURNOVER KIT B (KITS) ×6 IMPLANT
LOOP VESSEL MAXI BLUE (MISCELLANEOUS) IMPLANT
LOOP VESSEL MINI RED (MISCELLANEOUS) IMPLANT
NDL HYPO 25X1 1.5 SAFETY (NEEDLE) IMPLANT
NDL SPNL 18GX3.5 QUINCKE PK (NEEDLE) IMPLANT
NEEDLE HYPO 25X1 1.5 SAFETY (NEEDLE) IMPLANT
NEEDLE SPNL 18GX3.5 QUINCKE PK (NEEDLE) ×3 IMPLANT
NS IRRIG 1000ML POUR BTL (IV SOLUTION) ×3 IMPLANT
PACK LAMINECTOMY NEURO (CUSTOM PROCEDURE TRAY) ×3 IMPLANT
PAD ARMBOARD 7.5X6 YLW CONV (MISCELLANEOUS) ×2 IMPLANT
SPONGE INTESTINAL PEANUT (DISPOSABLE) ×6 IMPLANT
SPONGE LAP 18X18 RF (DISPOSABLE) ×3 IMPLANT
SPONGE LAP 4X18 RFD (DISPOSABLE) IMPLANT
SPONGE SURGIFOAM ABS GEL 100 (HEMOSTASIS) ×3 IMPLANT
STAPLER VISISTAT (STAPLE) IMPLANT
SUT PDS AB 1 CTX 36 (SUTURE) ×3 IMPLANT
SUT PROLENE 4 0 RB 1 (SUTURE)
SUT PROLENE 4-0 RB1 .5 CRCL 36 (SUTURE) IMPLANT
SUT PROLENE 5 0 CC1 (SUTURE) IMPLANT
SUT PROLENE 6 0 C 1 30 (SUTURE) ×3 IMPLANT
SUT PROLENE 6 0 CC (SUTURE) IMPLANT
SUT SILK 0 TIES 10X30 (SUTURE) ×3 IMPLANT
SUT SILK 2 0 TIES 10X30 (SUTURE) ×6 IMPLANT
SUT SILK 2 0SH CR/8 30 (SUTURE) IMPLANT
SUT SILK 3 0 TIES 10X30 (SUTURE) ×3 IMPLANT
SUT SILK 3 0SH CR/8 30 (SUTURE) IMPLANT
SUT VIC AB 0 CT1 27 (SUTURE) ×3
SUT VIC AB 0 CT1 27XBRD ANBCTR (SUTURE) ×1 IMPLANT
SUT VIC AB 1 CT1 18XBRD ANBCTR (SUTURE) ×1 IMPLANT
SUT VIC AB 1 CT1 8-18 (SUTURE) ×3
SUT VIC AB 2-0 CP2 18 (SUTURE) ×3 IMPLANT
SUT VIC AB 2-0 CTB1 (SUTURE) ×3 IMPLANT
SUT VIC AB 3-0 SH 27 (SUTURE) ×3
SUT VIC AB 3-0 SH 27X BRD (SUTURE) ×1 IMPLANT
SUT VIC AB 3-0 SH 8-18 (SUTURE) ×3 IMPLANT
SUT VIC AB 4-0 RB1 18 (SUTURE) ×3 IMPLANT
SUT VICRYL 4-0 PS2 18IN ABS (SUTURE) IMPLANT
TOWEL GREEN STERILE (TOWEL DISPOSABLE) ×6 IMPLANT
TOWEL GREEN STERILE FF (TOWEL DISPOSABLE) ×9 IMPLANT
TRAY FOLEY MTR SLVR 16FR STAT (SET/KITS/TRAYS/PACK) ×3 IMPLANT
WATER STERILE IRR 1000ML POUR (IV SOLUTION) ×3 IMPLANT

## 2019-01-05 NOTE — H&P (Addendum)
Isaac Hall is an 48 y.o. male.   Chief Complaint: Back and lower extremity pain HPI: Patient is a 48 year old individual who had a work-related injury about 2 months ago.  He sustained significant back and left lower extremity pain despite efforts at conservative management has started to develop some leg pain and weakness on the left side he was found to have a severely herniated disc at L5-S1 on the left side in the region of the foramen this was causing significant compression on the L5 nerve root and because the disc had been severely degenerated there was substantial collapse of the disc space.  I did not feel that the patient would do well with a simple attempt at a decompression and this far lateral space and ultimately he would need decompression and stabilization procedure and ultimately I have advised that he undergo surgical decompression and arthrodesis at the L5-S1 level given the nature of his back problem I believe he would be better served with an anterior lumbar interbody arthrodesis.  He is now admitted for this procedure.  Past Medical History:  Diagnosis Date  . Anxiety   . Arthritis   . Back pain   . Diabetes mellitus without complication ([place])   . Hypertension   . Lower extremity pain 08/2018    Past Surgical History:  Procedure Laterality Date  . VASECTOMY      Family History  Problem Relation Age of Onset  . Cancer Mother   . Diabetes Mother   . Diabetes Father   . Diabetes Sister   . Diabetes Sister   . Diabetes Sister   . Diabetes Sister   . Hypertension Sister    Social History:  reports that he has never smoked. He has never used smokeless tobacco. He reports that he does not drink alcohol or use drugs.  Allergies: No Known Allergies  Medications Prior to Admission  Medication Sig Dispense Refill  . atorvastatin (LIPITOR) 10 MG tablet Take 1 tablet (10 mg total) by mouth daily. 90 tablet 3  . citalopram (CELEXA) 20 MG tablet Take 1 tablet  (20 mg total) by mouth daily. 90 tablet 3  . diclofenac (VOLTAREN) 75 MG EC tablet Take 75 mg by mouth 2 (two) times daily.    Marland Kitchen gabapentin (NEURONTIN) 300 MG capsule Take 300 mg by mouth 2 (two) times daily.    Marland Kitchen lisinopril (ZESTRIL) 5 MG tablet Take 1 tablet (5 mg total) by mouth daily. 90 tablet 3  . metFORMIN (GLUCOPHAGE) 500 MG tablet Take 1 tablet (500 mg total) by mouth 2 (two) times daily with a meal. 180 tablet 3  . methocarbamol (ROBAXIN) 500 MG tablet Take 500 mg by mouth 2 (two) times daily.    Marland Kitchen tobramycin (TOBREX) 0.3 % ophthalmic solution Place 1 drop into the left eye 2 (two) times daily as needed (stye).     . cyclobenzaprine (FLEXERIL) 10 MG tablet Take 1 tablet (10 mg total) by mouth 2 (two) times daily as needed. (Patient not taking: Reported on 12/28/2018) 10 tablet 0  . lidocaine (LIDODERM) 5 % Place 1 patch onto the skin daily. Remove & Discard patch within 12 hours or as directed by MD (Patient not taking: Reported on 12/28/2018) 30 patch 0  . meloxicam (MOBIC) 15 MG tablet TAKE 1 TABLET BY MOUTH EVERY DAY (Patient not taking: Reported on 12/28/2018) 30 tablet 0  . ONETOUCH DELICA LANCETS 81X MISC     . oxyCODONE-acetaminophen (PERCOCET/ROXICET) 5-325 MG tablet Take 1-2 tablets by  mouth every 6 (six) hours as needed for severe pain. (Patient not taking: Reported on 12/28/2018) 15 tablet 0    Results for orders placed or performed during the hospital encounter of 01/05/19 (from the past 48 hour(s))  Glucose, capillary     Status: Abnormal   Collection Time: 01/05/19  6:49 AM  Result Value Ref Range   Glucose-Capillary 137 (H) 70 - 99 mg/dL   No results found.  Review of Systems  Constitutional: Negative.   HENT: Negative.   Eyes: Negative.   Respiratory: Negative.   Cardiovascular: Negative.   Gastrointestinal: Negative.   Genitourinary: Negative.   Musculoskeletal: Positive for back pain.  Skin: Negative.   Neurological: Positive for tingling, sensory change  and focal weakness.  Endo/Heme/Allergies: Negative.   Psychiatric/Behavioral: Negative.     Blood pressure 117/67, pulse (!) 106, temperature 98.9 F (37.2 C), resp. rate 20, height 5\' 4"  (1.626 m), weight 82.2 kg, SpO2 99 %. Physical Exam  Constitutional: He is oriented to person, place, and time. He appears well-developed and well-nourished.  HENT:  Head: Normocephalic and atraumatic.  Eyes: Pupils are equal, round, and reactive to light. Conjunctivae and EOM are normal.  Neck: Normal range of motion. Neck supple.  Cardiovascular: Normal rate and regular rhythm.  Respiratory: Effort normal and breath sounds normal.  GI: Soft. Bowel sounds are normal.  Musculoskeletal:     Comments: Positive straight leg raising at 30 degrees Patrick's maneuver is negative bilaterally.  Neurological: He is alert and oriented to person, place, and time.  Mild weakness in the tibialis anterior bilaterally at 4 out of 5 absent deep tendon reflexes in the patellae and the Achilles both.  Upper extremity reflexes and cranial nerve examination is normal.  Skin: Skin is warm and dry.  Psychiatric: He has a normal mood and affect. His behavior is normal. Judgment and thought content normal.     Assessment/Plan Spondylosis with foraminal disc herniation L5-S1.  Severe left lumbar radiculopathy L5.  Plan: Anterior decompression arthrodesis L5-S1.  , MD 01/05/2019, 7:43 AM

## 2019-01-05 NOTE — Op Note (Signed)
    NAME: Isaac Hall    MRN: [id number] DOB: Jun 27, 1970    DATE OF OPERATION: 01/05/2019  PREOP DIAGNOSIS:    Degenerative disc disease L5-S1  POSTOP DIAGNOSIS:    Same  PROCEDURE:    Anterior retroperitoneal exposure L5-S1  EXPOSURE SURGEON: Judeth Cornfield. Scot Dock, MD  SPINE SURGEON: Kristeen Miss, MD  ANESTHESIA: General  EBL: Minimal  INDICATIONS:    Sidhant Eastham is a 48 y.o. male with significant degenerative disc disease at L5-S1.  Vascular surgery was asked to provide anterior retroperitoneal exposure  FINDINGS:   Iliac arteries on the left were soft without significant calcific disease.   TECHNIQUE:   The patient was taken to the operating room and received general anesthetic.  Monitoring lines have been placed by anesthesia.  The level of the L5-S1 disc space was marked under fluoroscopy.  The abdomen was prepped and draped in usual sterile fashion.  A left transverse incision was made at the marked level and dissection carried down to the anterior rectus sheath.  This was opened transversely.  The anterior rectus sheath was mobilized superiorly and inferiorly.  The rectus muscle was then retracted to the patient's right.  The retroperitoneal space was entered.  The dissection carried down to the left external iliac artery.  This was mobilized bluntly and the dissection continued onto the sacral promontory and the L5-S1 disc space.  The middle sacral vessels were divided between 3-0 silk ties.  Using blunt dissection the L5-S1 disc space was exposed.  The Thompson retractor system was placed.  A reverse lip retractor was placed on the right side of the disc, and a retractor placed on the left side and inferiorly allowing adequate exposure of L5-S1.  Correct position was confirmed with fluoroscopy.  The remainder of the dictation is as per Dr. Ellene Route.  Deitra Mayo, MD, FACS [facility]  DATE OF DICTATION:    01/05/2019

## 2019-01-05 NOTE — Progress Notes (Signed)
Orthopedic Tech Progress Note Patient Details:  Isaac Hall [birth year] [id number] BRACE is down in PACU. Dropped off by [facility] this morning. Patient ID: [name], male   DOB: [birth year], 48 y.o.   MRN: [id number]   Janit Pagan 01/05/2019, 12:31 PM

## 2019-01-05 NOTE — Interval H&P Note (Signed)
History and Physical Interval Note:  01/05/2019 7:17 AM  Isaac Hall  has presented today for surgery, with the diagnosis of Herniated nucleus pulposus, Lumbar.  The various methods of treatment have been discussed with the patient and family. After consideration of risks, benefits and other options for treatment, the patient has consented to  Procedure(s) with comments: Lumbar 5 Sacral 1 Anterior lumbar interbody fusion (N/A) - Lumbar 5 Sacral 1 Anterior lumbar interbody fusion ABDOMINAL EXPOSURE (N/A) as a surgical intervention.  The patient's history has been reviewed, patient examined, no change in status, stable for surgery.  I have reviewed the patient's chart and labs.  Questions were answered to the patient's satisfaction.     Deitra Mayo

## 2019-01-05 NOTE — Anesthesia Procedure Notes (Signed)
Procedure Name: Intubation Date/Time: 01/05/2019 7:43 AM Performed by: Marsa Aris, CRNA Pre-anesthesia Checklist: Patient identified, Emergency Drugs available, Suction available and Patient being monitored Patient Re-evaluated:Patient Re-evaluated prior to induction Oxygen Delivery Method: Circle System Utilized Preoxygenation: Pre-oxygenation with 100% oxygen Induction Type: IV induction Ventilation: Mask ventilation without difficulty Laryngoscope Size: Miller and 2 Grade View: Grade I Tube type: Oral Number of attempts: 1 Airway Equipment and Method: Stylet and Bite block Placement Confirmation: ETT inserted through vocal cords under direct vision,  positive ETCO2 and breath sounds checked- equal and bilateral Secured at: 22 cm Tube secured with: Tape Dental Injury: Teeth and Oropharynx as per pre-operative assessment  Comments: No change in dentition from pre-procedure

## 2019-01-05 NOTE — Op Note (Addendum)
Date of surgery: 01/05/2019 Preoperative diagnosis: Spondylosis with herniated nucleus pulposus L5-S1 left left lumbar radiculopathy Postoperative diagnosis: Same Procedure: Anterior lumbar decompression L5-S1 and arthrodesis with internal fixation L5-S1 using 6 x 42 x 30 mm anterior cage with 15 degrees of lordosis and two 5 x 25 mm screws in L5 and one 5 x 20 mm screw in S1. Surgeon: Kristeen Miss Approach: Is Dr. Shanon Rosser, vascular surgery Anesthesia: General endotracheal Indications: Isaac Hall is a 48 year old individual who had a work-related injury about 2 months ago he was found to have a severe lumbar radiculopathy with elements of a foot drop on the left side he was treated conservatively but to no avail he was found to have an advanced disc degeneration with a large foraminal disc protrusion on the left side at L5-S1.  Is felt to adequately decompress this area he would require not only decompression of the disc herniation but stabilization of the joint this is being done via an anterior lumbar interbody arthrodesis.  Procedure: Patient was brought to the operating room supine on the stretcher.  After the smooth induction of general endotracheal anesthesia the anterior portion of the abdomen was marked using fluoroscopic guidance.  Then Dr. Doren Custard started the procedure by creating a transverse incision and dissecting to the retroperitoneal space to expose L5-S1.  Once propria retractors were in place I started my portion by opening the anterior longitudinal ligament and then dissecting into the disc space and removing a substantial quantity of severely degenerated and desiccated disc material within the disc space.  The endplates were then carefully curettaged smoothed region the posterior longitudinal ligament was carefully uncovered and this was dissected first out to the right side and then over to the left side on the left side there was noted to be significant material protruding  through the posterior longitudinal ligament and this was in the area of the foramen itself several large fragments of disc were retrieved from this area and this allowed for good visualization of the L5 nerve root laterally and the S1 nerve root medially with a discectomy being completed the area of the common dural tube could also be visualized more medially.  Once this area was cleaned and hemostasis was achieved the endplates were completely smoothed with a 4 mm barrel bit to allow for good decortication of the endplates themselves.  Then the interspace was appropriately sized for spacer and was felt that a 42 x 30 mm spacer with 6 mm of height posteriorly and 14 mm of height anteriorly creating 15 degrees lordosis would fit best into the interval in the AP and lateral projections using fluoroscopic guidance.The prosthesis was filled with allograft and then fitted into the interspace under flouroscopic guidance.  Then it was secured in position with two 5.0 x 25 mm screws and L5 and one 5 x 20 mm screw in S1.  Final radiographs were then obtained after the retractors were removed hemostasis in the soft tissues was checked in the retroperitoneal space was noted to be quite hemostatic.  Then the anterior rectus sheath was closed with a 0 running PDS suture.  2-0 Vicryl was used in subcutaneous tissues and 10 cc of a mixture of lidocaine and Marcaine was placed into the anterior space and the Scarpa's fascia was closed with 2-0 Vicryl 3-0 Vicryl and 4-0 Vicryl was used in a subcuticular skin Dermabond was placed on the skin and blood loss for the procedure was estimated 250 cc.

## 2019-01-05 NOTE — Transfer of Care (Signed)
Immediate Anesthesia Transfer of Care Note  Patient: Isaac Hall  Procedure(s) Performed: Lumbar five Sacral one Anterior lumbar interbody fusion (N/A ) ABDOMINAL EXPOSURE (N/A )  Patient Location: PACU  Anesthesia Type:General  Level of Consciousness: awake, alert  and oriented  Airway & Oxygen Therapy: Patient Spontanous Breathing and Patient connected to face mask oxygen  Post-op Assessment: Report given to RN and Post -op Vital signs reviewed and stable  Post vital signs: Reviewed and stable  Last Vitals:  Vitals Value Taken Time  BP 107/73 01/05/19 1053  Temp 36.7 C 01/05/19 1038  Pulse 118 01/05/19 1059  Resp    SpO2 94 % 01/05/19 1059  Vitals shown include unvalidated device data.  Last Pain:  Vitals:   01/05/19 0634  PainSc: 5       Patients Stated Pain Goal: 2 (32/20/25 4270)  Complications: No apparent anesthesia complications

## 2019-01-05 NOTE — Anesthesia Postprocedure Evaluation (Signed)
Anesthesia Post Note  Patient: Isaac Hall  Procedure(s) Performed: Lumbar five Sacral one Anterior lumbar interbody fusion (N/A ) ABDOMINAL EXPOSURE (N/A )     Patient location during evaluation: PACU Anesthesia Type: General Level of consciousness: awake and alert Pain management: pain level controlled Vital Signs Assessment: post-procedure vital signs reviewed and stable Respiratory status: spontaneous breathing, nonlabored ventilation, respiratory function stable and patient connected to nasal cannula oxygen Cardiovascular status: blood pressure returned to baseline and stable Postop Assessment: no apparent nausea or vomiting Anesthetic complications: no    Last Vitals:  Vitals:   01/05/19 1238 01/05/19 1253  BP: 102/73 109/69  Pulse: (!) 126 (!) 126  Resp:  20  Temp:  36.7 C  SpO2: 95% 95%    Last Pain:  Vitals:   01/05/19 1255  TempSrc:   PainSc: 3                  Barnet Glasgow

## 2019-01-05 NOTE — Evaluation (Signed)
Physical Therapy Evaluation Patient Details Name: Isaac Hall MRN: [id number] DOB: 11/05/1970 Today's Date: 01/05/2019   History of Present Illness  pt is a 48 y/o male with h/o HTN and DM admitted due to significant back pain and progressive weakness due to a herniated disc at L5 S1 sustained 2 months ago in a work-related injurty.  Pt s/p L5/S1 Ant lumbar decompression and fusion with internal fixation.  Clinical Impression  Pt admitted with/for lumbar decompress/fusion surgery.  Pt generally at a supervision level..  Pt currently limited functionally due to the problems listed below.  (see problems list.)  Pt will benefit from PT to maximize function and safety to be able to get home safely with available assist.     Follow Up Recommendations No PT follow up    Equipment Recommendations  None recommended by PT    Recommendations for Other Services       Precautions / Restrictions Precautions Precautions: Back Required Braces or Orthoses: Spinal Brace Spinal Brace: Lumbar corset;Applied in sitting position      Mobility  Bed Mobility Overal bed mobility: Needs Assistance Bed Mobility: Rolling;Sidelying to Sit;Sit to Sidelying Rolling: Supervision Sidelying to sit: Supervision     Sit to sidelying: Supervision General bed mobility comments: cues for sequence/technique.  Pt practiced and took critique to build momentum well improving withing session.  Transfers Overall transfer level: Needs assistance Equipment used: None Transfers: Sit to/from Stand Sit to Stand: Supervision            Ambulation/Gait Ambulation/Gait assistance: Supervision Gait Distance (Feet): 300 Feet Assistive device: None Gait Pattern/deviations: Step-through pattern   Gait velocity interpretation: >2.62 ft/sec, indicative of community ambulatory General Gait Details: steady and age appropriate gait speeds  Stairs Stairs: Yes   Stair Management: One rail Right;Alternating  pattern;Forwards Number of Stairs: 12 General stair comments: safe with rail  Wheelchair Mobility    Modified Rankin (Stroke Patients Only)       Balance Overall balance assessment: No apparent balance deficits (not formally assessed)                                           Pertinent Vitals/Pain Pain Assessment: Faces Faces Pain Scale: Hurts a little bit Pain Location: incisional pain Pain Descriptors / Indicators: Guarding Pain Intervention(s): Monitored during session    Home Living Family/patient expects to be discharged to:: Private residence Living Arrangements: Spouse/significant other Available Help at Discharge: Family;Available 24 hours/day Type of Home: House Home Access: Stairs to enter Entrance Stairs-Rails: Doctor, general practice of Steps: 2 Home Layout: One level Home Equipment: Walker - 2 wheels;Cane - single point;Shower seat;Hospital bed      Prior Function Level of Independence: Independent               Hand Dominance        Extremity/Trunk Assessment   Upper Extremity Assessment Upper Extremity Assessment: Overall WFL for tasks assessed    Lower Extremity Assessment Lower Extremity Assessment: Overall WFL for tasks assessed       Communication   Communication: No difficulties  Cognition Arousal/Alertness: Awake/alert Behavior During Therapy: WFL for tasks assessed/performed Overall Cognitive Status: Within Functional Limits for tasks assessed  General Comments General comments (skin integrity, edema, etc.): pt/wife instructed in back care/prec, log roll, lifting restrictions, bracing issues and progression of activity.    Exercises     Assessment/Plan    PT Assessment Patient needs continued PT services  PT Problem List Decreased activity tolerance;Decreased mobility;Decreased knowledge of use of DME;Decreased knowledge of  precautions;Pain       PT Treatment Interventions Gait training;Stair training;Functional mobility training;Therapeutic activities;Patient/family education    PT Goals (Current goals can be found in the Care Plan section)  Acute Rehab PT Goals Patient Stated Goal: Home Independent PT Goal Formulation: With patient Time For Goal Achievement: 01/12/19 Potential to Achieve Goals: Good    Frequency Min 5X/week   Barriers to discharge        Co-evaluation               AM-PAC PT "6 Clicks" Mobility  Outcome Measure Help needed turning from your back to your side while in a flat bed without using bedrails?: None Help needed moving from lying on your back to sitting on the side of a flat bed without using bedrails?: None Help needed moving to and from a bed to a chair (including a wheelchair)?: None Help needed standing up from a chair using your arms (e.g., wheelchair or bedside chair)?: None Help needed to walk in hospital room?: None Help needed climbing 3-5 steps with a railing? : A Little 6 Click Score: 23    End of Session   Activity Tolerance: Patient tolerated treatment well Patient left: in bed;with call bell/phone within reach;with family/visitor present Nurse Communication: Mobility status PT Visit Diagnosis: Other abnormalities of gait and mobility (R26.89);Pain Pain - part of body: (incisional)    Time: 2836-6294 PT Time Calculation (min) (ACUTE ONLY): 18 min   Charges:   PT Evaluation $PT Eval Low Complexity: 1 Low          01/05/2019  Donnella Sham, PT Acute Rehabilitation Services [phone]  (pager) [phone]  (office)  Tessie Fass  01/05/2019, 6:11 PM

## 2019-01-05 NOTE — Progress Notes (Signed)
Patient ID: Isaac Hall, male   DOB: Sep 28, 1970, 48 y.o.   MRN: [id number] Vital signs are stable Motor function appears intact Patient is doing well

## 2019-01-06 ENCOUNTER — Encounter (HOSPITAL_COMMUNITY): Payer: Self-pay | Admitting: Neurological Surgery

## 2019-01-06 LAB — GLUCOSE, CAPILLARY: Glucose-Capillary: 128 mg/dL — ABNORMAL HIGH (ref 70–99)

## 2019-01-06 MED ORDER — METHOCARBAMOL 500 MG PO TABS: 500.0000 mg | ORAL_TABLET | Freq: Four times a day (QID) | ORAL | 3 refills | Status: DC | PRN

## 2019-01-06 MED ORDER — OXYCODONE-ACETAMINOPHEN 5-325 MG PO TABS: 1.0000 | ORAL_TABLET | ORAL | 0 refills | Status: DC | PRN

## 2019-01-06 MED FILL — Sodium Chloride IV Soln 0.9%: INTRAVENOUS | Qty: 1000 | Status: AC

## 2019-01-06 MED FILL — Heparin Sodium (Porcine) Inj 1000 Unit/ML: INTRAMUSCULAR | Qty: 30 | Status: AC

## 2019-01-06 NOTE — Progress Notes (Signed)
Pt discharged home. All questions and concerns addressed and answered. Pt escorted to front and place in personal vehicle. HE was accompanied by his spouse.

## 2019-01-06 NOTE — Progress Notes (Signed)
PT Cancellation Note  Patient Details Name: Isaac Hall MRN: [id number] DOB: 1970-09-04   Cancelled Treatment:    Reason Eval/Treat Not Completed: PT screened, no needs identified, will sign off. Discussed pt case with OT who states pt is currently functioning at a mod I level without an AD and does not require any further acute PT services at this time. Observed pt ambulating in hall without difficulty. Pt will have good family support upon d/c. Will sign off at this time. If needs change, please reconsult.    Thelma Comp 01/06/2019, 10:06 AM   Rolinda Roan, PT, DPT Acute Rehabilitation Services Pager: [phone] Office: [phone]

## 2019-01-06 NOTE — Evaluation (Signed)
Occupational Therapy Evaluation Patient Details Name: Isaac Hall MRN: [id number] DOB: Aug 25, 1970 Today's Date: 01/06/2019    History of Present Illness 48 y/o male with h/o HTN and DM admitted due to significant back pain and progressive weakness due to a herniated disc at L5 S1 sustained 2 months ago in a work-related injurty.  Pt s/p L5/S1 Ant lumbar decompression and fusion with internal fixation.   Clinical Impression   PTA, pt was living with his wife and two children and was independent (prior to back injury). Currently, pt performing ADLs and functional mobility at Mod I level. Provided education and handout on back precautions, brace management, bed mobility, grooming, LB ADLs, toileting, and tub transfer with shower seat; pt demonstrated and verbalized  understanding. Answered all pt questions. Recommend dc home once medically stable per physician. All acute OT needs met and will sign off. Thank you.     Follow Up Recommendations  No OT follow up    Equipment Recommendations  None recommended by OT    Recommendations for Other Services       Precautions / Restrictions Precautions Precautions: Back Precaution Booklet Issued: Yes (comment) Precaution Comments: Pt recalling 2/3 precautions. Requiring min cues for recalling "no twisting" Required Braces or Orthoses: Spinal Brace Spinal Brace: Lumbar corset;Applied in sitting position Restrictions Weight Bearing Restrictions: No      Mobility Bed Mobility Overal bed mobility: Modified Independent Bed Mobility: Rolling;Sidelying to Sit;Sit to Sidelying Rolling: Modified independent (Device/Increase time) Sidelying to sit: Modified independent (Device/Increase time)     Sit to sidelying: Modified independent (Device/Increase time) General bed mobility comments: Pt able to perform log roll with increased time and use of bed rails.   Transfers Overall transfer level: Modified independent   Transfers: Sit  to/from Stand Sit to Stand: Modified independent (Device/Increase time)         General transfer comment: Mod I level with increased time as needed    Balance Overall balance assessment: No apparent balance deficits (not formally assessed)                                         ADL either performed or assessed with clinical judgement   ADL Overall ADL's : Modified independent                                       General ADL Comments: Pt performing ADLs at Mod I level with increased time. Providing education and handout on back precautions, LB ADLs, brace management, bed mobility, grooming, toileting, and functional transfers (including tub transfer and car transfer). Pt demonstrated and verablized understanding     Vision         Perception     Praxis      Pertinent Vitals/Pain Pain Assessment: Faces Faces Pain Scale: Hurts a little bit Pain Location: incisional pain Pain Descriptors / Indicators: Guarding Pain Intervention(s): Monitored during session;Limited activity within patient's tolerance;Repositioned     Hand Dominance     Extremity/Trunk Assessment Upper Extremity Assessment Upper Extremity Assessment: Overall WFL for tasks assessed   Lower Extremity Assessment Lower Extremity Assessment: Overall WFL for tasks assessed   Cervical / Trunk Assessment Cervical / Trunk Assessment: Other exceptions Cervical / Trunk Exceptions: s/p back sx   Communication Communication Communication: No difficulties   Cognition Arousal/Alertness:  Awake/alert Behavior During Therapy: WFL for tasks assessed/performed Overall Cognitive Status: Within Functional Limits for tasks assessed                                     General Comments  Pt very motivated and excited to perform therapy    Exercises     Shoulder Instructions      Home Living Family/patient expects to be discharged to:: Private residence Living  Arrangements: Spouse/significant other;Children(two children (ages 46 and 15 yo)) Available Help at Discharge: Family;Available 24 hours/day Type of Home: House Home Access: Stairs to enter [employer] of Steps: 2 Entrance Stairs-Rails: Right;Left Home Layout: One level     Bathroom Shower/Tub: Tub/shower unit;Curtain   Bathroom Toilet: Handicapped height     Home Equipment: Environmental consultant - 2 wheels;Cane - single point;Shower seat;Hospital bed          Prior Functioning/Environment Level of Independence: Independent        Comments: ADLs, IADLs, and working untill recent back injury. Since injury, pt with progressive weakness and required assistance from family for LB ADLs        OT Problem List: Decreased activity tolerance;Impaired balance (sitting and/or standing);Decreased knowledge of use of DME or AE;Decreased knowledge of precautions;Pain      OT Treatment/Interventions:      OT Goals(Current goals can be found in the care plan section) Acute Rehab OT Goals Patient Stated Goal: "Go home today." OT Goal Formulation: All assessment and education complete, DC therapy  OT Frequency:     Barriers to D/C:            Co-evaluation              AM-PAC OT "6 Clicks" Daily Activity     Outcome Measure Help from another person eating meals?: None Help from another person taking care of personal grooming?: None Help from another person toileting, which includes using toliet, bedpan, or urinal?: None Help from another person bathing (including washing, rinsing, drying)?: None Help from another person to put on and taking off regular upper body clothing?: None Help from another person to put on and taking off regular lower body clothing?: None 6 Click Score: 24   End of Session Equipment Utilized During Treatment: Back brace Nurse Communication: Mobility status  Activity Tolerance: Patient tolerated treatment well Patient left: in bed;with call bell/phone  within reach  OT Visit Diagnosis: Other abnormalities of gait and mobility (R26.89);Pain;Muscle weakness (generalized) (M62.81) Pain - part of body: (Back)                Time: 9678-9381 OT Time Calculation (min): 16 min Charges:  OT General Charges $OT Visit: 1 Visit OT Evaluation $OT Eval Low Complexity: [place], OTR/L Acute Rehab Pager: [phone] Office: [place] 01/06/2019, 8:33 AM

## 2019-01-06 NOTE — Discharge Summary (Signed)
Physician Discharge Summary  Patient ID: Isaac Hall MRN: [id number] DOB/AGE: 18-May-1970 48 y.o.  Admit date: 01/05/2019 Discharge date: 01/06/2019  Admission Diagnoses: Lumbar spondylosis and herniated nucleus pulposus L5-S1 with left lumbar radiculopathy, foot drop  Discharge Diagnoses: Lumbar spondylosis and herniated nucleus pulposus L5-S1 with left lumbar radiculopathy, foot drop.  Diabetes mellitus. Active Problems:   Herniated nucleus pulposus, L5-S1, left   Discharged Condition: good  Hospital Course: Patient was admitted to undergo surgical decompression via an anterior approach he tolerated surgery well he is ambulatory and he has good relief of his left leg pain.  Consults: vascular surgery  Significant Diagnostic Studies: None  Treatments: surgery: Anterior lumbar decompression L5-S1 with arthrodesis using titanium spacer allograft and screw fixation  Discharge Exam: Blood pressure 112/82, pulse 82, temperature 98.2 F (36.8 C), temperature source Oral, resp. rate 20, height 5\' 4"  (1.626 m), weight 82.2 kg, SpO2 97 %. Incision is clean and dry motor function is intact Station and gait are intact  Disposition: Discharge disposition: 01-Home or Self Care       Discharge Instructions    Call MD for:  redness, tenderness, or signs of infection (pain, swelling, redness, odor or green/yellow discharge around incision site)   Complete by: As directed    Call MD for:  severe uncontrolled pain   Complete by: As directed    Call MD for:  temperature >100.4   Complete by: As directed    Diet - low sodium heart healthy   Complete by: As directed    Discharge instructions   Complete by: As directed    Okay to shower. Do not apply salves or appointments to incision. No heavy lifting with the upper extremities greater than 15 pounds. May resume driving when not requiring pain medication and patient feels comfortable with doing so.   Incentive spirometry RT    Complete by: As directed    Increase activity slowly   Complete by: As directed      Allergies as of 01/06/2019   No Known Allergies     Medication List    TAKE these medications   atorvastatin 10 MG tablet Commonly known as: LIPITOR Take 1 tablet (10 mg total) by mouth daily.   citalopram 20 MG tablet Commonly known as: CELEXA Take 1 tablet (20 mg total) by mouth daily.   cyclobenzaprine 10 MG tablet Commonly known as: FLEXERIL Take 1 tablet (10 mg total) by mouth 2 (two) times daily as needed.   diclofenac 75 MG EC tablet Commonly known as: VOLTAREN Take 75 mg by mouth 2 (two) times daily.   gabapentin 300 MG capsule Commonly known as: NEURONTIN Take 300 mg by mouth 2 (two) times daily.   lidocaine 5 % Commonly known as: Lidoderm Place 1 patch onto the skin daily. Remove & Discard patch within 12 hours or as directed by MD   lisinopril 5 MG tablet Commonly known as: ZESTRIL Take 1 tablet (5 mg total) by mouth daily.   meloxicam 15 MG tablet Commonly known as: MOBIC TAKE 1 TABLET BY MOUTH EVERY DAY   metFORMIN 500 MG tablet Commonly known as: GLUCOPHAGE Take 1 tablet (500 mg total) by mouth 2 (two) times daily with a meal.   methocarbamol 500 MG tablet Commonly known as: ROBAXIN Take 500 mg by mouth 2 (two) times daily. What changed: Another medication with the same name was added. Make sure you understand how and when to take each.   methocarbamol 500 MG tablet Commonly known as:  ROBAXIN Take 1 tablet (500 mg total) by mouth every 6 (six) hours as needed for muscle spasms. What changed: You were already taking a medication with the same name, and this prescription was added. Make sure you understand how and when to take each.   OneTouch Delica Lancets 33G Misc   oxyCODONE-acetaminophen 5-325 MG tablet Commonly known as: PERCOCET/ROXICET Take 1-2 tablets by mouth every 4 (four) hours as needed for moderate pain or severe pain. What changed:   when to  take this  reasons to take this   tobramycin 0.3 % ophthalmic solution Commonly known as: TOBREX Place 1 drop into the left eye 2 (two) times daily as needed (stye).        Signed: Shary Key  01/06/2019, 9:10 AM

## 2019-01-06 NOTE — Progress Notes (Signed)
   VASCULAR SURGERY ASSESSMENT & PLAN:   1 Day Post-Op s/p: Anterior retroperitoneal exposure L5-S1  Doing well.  Vascular surgery will be available as needed.  SUBJECTIVE:   Pain well controlled.  PHYSICAL EXAM:   Vitals:   01/05/19 1604 01/05/19 2003 01/05/19 2259 01/06/19 0337  BP: 104/68 111/71 105/72 112/82  Pulse: (!) 106 (!) 116 95 82  Resp: 19 20 20 20   Temp: 98.3 F (36.8 C) 99.5 F (37.5 C) 99.1 F (37.3 C) 98.2 F (36.8 C)  TempSrc: Oral Oral Oral Oral  SpO2: 93% 95% 95% 97%  Weight:      Height:       Palpable pedal pulses.  LABS:    PROBLEM LIST:    Active Problems:   Herniated nucleus pulposus, L5-S1, left   CURRENT MEDS:   . atorvastatin  10 mg Oral q1800  . citalopram  20 mg Oral Daily  . docusate sodium  100 mg Oral BID  . gabapentin  300 mg Oral BID  . influenza vac split quadrivalent PF  0.5 mL Intramuscular Tomorrow-1000  . insulin aspart  0-15 Units Subcutaneous TID WC  . lisinopril  5 mg Oral Daily  . metFORMIN  500 mg Oral BID WC  . methocarbamol  500 mg Oral BID  . senna  1 tablet Oral BID  . sodium chloride flush  3 mL Intravenous Q12H    Deitra Mayo Beeper: [phone] Office: [phone] 01/06/2019

## 2019-01-20 ENCOUNTER — Other Ambulatory Visit (HOSPITAL_COMMUNITY): Payer: Self-pay | Admitting: Neurological Surgery

## 2019-01-20 ENCOUNTER — Ambulatory Visit (HOSPITAL_COMMUNITY)
Admission: RE | Admit: 2019-01-20 | Discharge: 2019-01-20 | Disposition: A | Discharge status: Home / Self Care | Payer: BC Managed Care – PPO | Source: Ambulatory Visit | Attending: Family | Admitting: Family

## 2019-01-20 ENCOUNTER — Other Ambulatory Visit: Payer: Self-pay

## 2019-01-20 DIAGNOSIS — M79605 Pain in left leg: Secondary | ICD-10-CM | POA: Diagnosis not present

## 2019-01-20 DIAGNOSIS — M5137 Other intervertebral disc degeneration, lumbosacral region: Secondary | ICD-10-CM | POA: Diagnosis not present

## 2019-03-15 ENCOUNTER — Ambulatory Visit (INDEPENDENT_AMBULATORY_CARE_PROVIDER_SITE_OTHER): Payer: BC Managed Care – PPO | Admitting: Emergency Medicine

## 2019-03-15 ENCOUNTER — Encounter: Payer: Self-pay | Admitting: Emergency Medicine

## 2019-03-15 ENCOUNTER — Other Ambulatory Visit: Payer: Self-pay

## 2019-03-15 VITALS — BP 123/82 | HR 87 | Temp 98.2°F | Resp 16 | Ht 63.0 in | Wt 193.0 lb

## 2019-03-15 DIAGNOSIS — E1165 Type 2 diabetes mellitus with hyperglycemia: Secondary | ICD-10-CM | POA: Diagnosis not present

## 2019-03-15 DIAGNOSIS — H0014 Chalazion left upper eyelid: Secondary | ICD-10-CM

## 2019-03-15 DIAGNOSIS — Z23 Encounter for immunization: Secondary | ICD-10-CM

## 2019-03-15 LAB — POCT GLYCOSYLATED HEMOGLOBIN (HGB A1C): Hemoglobin A1C: 6.9 % — AB (ref 4.0–5.6)

## 2019-03-15 LAB — GLUCOSE, POCT (MANUAL RESULT ENTRY): POC Glucose: 188 mg/dl — AB (ref 70–99)

## 2019-03-15 MED ORDER — METFORMIN HCL 500 MG PO TABS: 500.0000 mg | ORAL_TABLET | Freq: Two times a day (BID) | ORAL | 3 refills | Status: DC

## 2019-03-15 NOTE — Patient Instructions (Addendum)
If you have lab work done today you will be contacted with your lab results within the next 2 weeks.  If you have not heard from [place] then please contact us. The fastest way to get your results is to register for My Chart.   IF you received an x-ray today, you will receive an invoice from [facility]. Please contact [facility] at [phone] with questions or concerns regarding your invoice.   IF you received labwork today, you will receive an invoice from [place]. Please contact [facility] at [phone] with questions or concerns regarding your invoice.   Our billing staff will not be able to assist you with questions regarding bills from these companies.  You will be contacted with the lab results as soon as they are available. The fastest way to get your results is to activate your My Chart account. Instructions are located on the last page of this paperwork. If you have not heard from [place] regarding the results in 2 weeks, please contact this office.    Chalacin Chalazion  Un chalacin es una inflamacin o una tumoracin en el prpado. Puede afectar al prpado superior o al inferior. Cules son las causas? Esta afeccin puede ser causada por lo siguiente:  La inflamacin crnica de las glndulas del prpado.  La obstruccin de una glndula sebcea en el prpado. Cules son los signos o los sntomas? Los sntomas de esta afeccin incluyen:  Hinchazn del prpado. La hinchazn se puede extender a otras zonas alrededor del ojo.  Un bulto duro en el prpado.  Visin borrosa. El bulto en el prpado puede puede dificultar la visin. Cmo se diagnostica? Esta afeccin se diagnostica con un examen ocular. Cmo se trata? La afeccin se trata mediante la aplicacin de compresas tibias en el prpado. Si la afeccin no mejora, el tratamiento puede incluir lo siguiente:  Medicamentos que un mdico Estate agent.  [place].  Medicamentos que se  Sport and exercise psychologist. Siga estas indicaciones en su casa: Control del dolor y de la hinchazn  Aplquese una compresa tibia y hmeda en el prpado 4 o 6veces al da durante 10 a cada vez. Esto ayudar a destapar las glndulas obstruidas y a Dispensing optician y la inflamacin.  Aplquese los medicamentos de venta libre y los recetados solamente como se lo haya indicado el mdico. Indicaciones generales  No se toque el chalacin.  No trate de extraer el pus. No apriete el chalacin ni lo pinche con un alfiler o una aguja.  No se frote los ojos.  Lvese las manos con frecuencia. Squelas con una toalla limpia.  Mantenga el rostro, el cuero cabelludo y las cejas limpios.  No use maquillaje.  Si el chalacin no se rompe (ruptura) por s solo, regrese al mdico.  Concurra a todas las visitas de control como se lo haya indicado el mdico. Esto es importante. Comunquese con un mdico si:  El prpado no ha mejorado despus de 4semanas.  El prpado est empeorando.  Tiene fiebre.  El chalacin no se rompe por s solo despus de un mes de Medical laboratory scientific officer. Solicite ayuda inmediatamente si:  Environmental education officer.  Presenta cambios en la visin.  El chalacin le causa dolor o est enrojecido.  El chalacin se agranda. Resumen  Un chalacin es una inflamacin o un bulto en el prpado superior o inferior.  Puede estar causado por una inflamacin crnica o por la obstruccin de [place]  sebcea.  Aplquese una compresa tibia y hmeda en el prpado 4 o 6veces al da durante 10 a 9minutos cada vez.  Mantenga el rostro, el cuero cabelludo y las cejas limpios. Esta informacin no tiene Marine scientist el consejo del mdico. Asegrese de hacerle al mdico cualquier pregunta que tenga. Document Released: 03/04/2005 Document Revised: 10/13/2017 Document Reviewed: 10/13/2017 Elsevier Patient Education  2020 [employer].  Diabetes mellitus y  nutricin, en adultos Diabetes Mellitus and Nutrition, Adult Si sufre de diabetes (diabetes mellitus), es muy importante tener hbitos alimenticios saludables debido a que sus niveles de Designer, television/film set sangre (glucosa) se ven afectados en gran medida por lo que come y bebe. [place] alimentos saludables en las cantidades [place], aproximadamente a la [employer], [place] ayudar a:  Aeronautical engineer glucemia.  Disminuir el riesgo de sufrir una enfermedad cardaca.  Mejorar la presin arterial.  Science writer o mantener un peso saludable. Todas las personas que sufren de diabetes son diferentes y cada una tiene necesidades diferentes en cuanto a un plan de alimentacin. El mdico puede recomendarle que trabaje con un especialista en dietas y nutricin (nutricionista) para Financial trader plan para usted. Su plan de alimentacin puede variar segn factores como:  Las caloras que necesita.  Los medicamentos que toma.  Su peso.  Sus niveles de glucemia, presin arterial y colesterol.  Su nivel de [place].  Otras afecciones que tenga, como enfermedades cardacas o renales. Cmo me afectan los carbohidratos? Los carbohidratos, o hidratos de carbono, afectan su nivel de glucemia ms que cualquier otro tipo de alimento. La ingesta de carbohidratos naturalmente aumenta la cantidad de [employer]. El recuento de carbohidratos es un mtodo destinado a Catering manager un registro de la cantidad de carbohidratos que se consumen. El recuento de carbohidratos es importante para Theatre manager la glucemia a un nivel saludable, especialmente si utiliza insulina o toma determinados medicamentos por va oral para la diabetes. Es importante conocer la cantidad de carbohidratos que se pueden ingerir en cada comida sin correr Engineer, manufacturing. Esto es Psychologist, forensic. Su nutricionista puede ayudarlo a calcular la cantidad de carbohidratos que debe ingerir en cada comida y en cada refrigerio. Entre los  alimentos que contienen carbohidratos, se incluyen:  Pan, cereal, arroz, pastas y galletas.  Papas y maz.  Guisantes, frijoles y lentejas.  Leche y Estate agent.  Lambert Mody y [place].  Postres, como pasteles, galletas, helado y caramelos. Cmo me afecta el alcohol? El alcohol puede provocar disminuciones sbitas de la glucemia (hipoglucemia), especialmente si utiliza insulina o toma determinados medicamentos por va oral para la diabetes. La hipoglucemia es una afeccin potencialmente mortal. Los sntomas de la hipoglucemia (somnolencia, mareos y confusin) son similares a los sntomas de haber consumido demasiado alcohol. Si el mdico afirma que el alcohol es seguro para usted, [place] estas pautas:  Limite el consumo de alcohol a no ms de 75medida por da si es mujer y no est [place], y a 54medidas si es hombre. Una medida equivale a 12oz (331ml) de cerveza, 5oz (195ml) de vino o 1oz (33ml) de bebidas alcohlicas de alta graduacin.  No beba con el estmago vaco.  Mantngase hidratado bebiendo agua, refrescos dietticos o t helado sin azcar.  Tenga en cuenta que los refrescos comunes, los jugos y otras bebida para Optician, dispensing pueden contener mucha azcar y se deben contar como carbohidratos. Cules son algunos consejos para seguir este plan?  Leer las etiquetas de los alimentos  Comience por leer el tamao  de la porcin en la "Informacin nutricional" en las etiquetas de los alimentos envasados y las bebidas. La cantidad de caloras, carbohidratos, grasas y otros nutrientes mencionados en la etiqueta se basan en una porcin del alimento. Muchos alimentos contienen ms de una porcin por envase.  Verifique la cantidad total de gramos (g) de carbohidratos totales en una porcin. Puede calcular la cantidad de porciones de carbohidratos al dividir el total de carbohidratos por 15. Por ejemplo, si un alimento tiene un total de 30g de carbohidratos, equivale a 2 porciones de  carbohidratos.  Verifique la cantidad de gramos (g) de grasas saturadas y grasas trans en una porcin. Escoja alimentos que no contengan grasa o que tengan un bajo contenido.  Verifique la cantidad de miligramos (mg) de sal (sodio) en una porcin. La mayora de las personas deben limitar la ingesta de sodio total a menos de  por Futures trader.  Siempre consulte la informacin nutricional de los alimentos etiquetados como "con bajo contenido de grasa" o "sin grasa". Estos alimentos pueden tener un mayor contenido de International aid/development worker agregada o carbohidratos refinados, y deben evitarse.  Hable con su nutricionista para identificar sus objetivos diarios en cuanto a los nutrientes mencionados en la etiqueta. Al ir de compras  Evite comprar alimentos procesados, enlatados o precocinados. Estos alimentos tienden a Counselling psychologist mayor cantidad de [place], sodio y azcar agregada.  Compre en la zona exterior de la tienda de comestibles. Esta zona incluye frutas y verduras frescas, granos a granel, carnes frescas y productos lcteos frescos. Al cocinar  Utilice mtodos de coccin a baja temperatura, como hornear, en lugar de mtodos de coccin a alta temperatura, como frer en abundante aceite.  Cocine con aceites saludables, como el aceite de [place], [place] o [place].  Evite cocinar con manteca, crema o carnes con alto contenido de grasa. Planificacin de las comidas  Coma las comidas y los refrigerios regularmente, preferentemente a la misma hora todos [place]. Evite pasar largos perodos de tiempo sin comer.  Consuma alimentos ricos en fibra, como frutas frescas, verduras, frijoles y cereales integrales. Consulte a su nutricionista sobre cuntas porciones de carbohidratos puede consumir en cada comida.  Consuma entre 4 y 6 onzas (oz) de protenas magras por da, como carnes [place], pollo, pescado, huevos o tofu. Una onza de protena magra equivale a: ? 1 onza de carne, pollo o pescado. ? 1huevo. ?  taza de  tofu.  Coma algunos alimentos por da que contengan grasas saludables, como aguacates, frutos secos, semillas y pescado. Estilo de vida  Controle su nivel de glucemia con regularidad.  Haga actividad fsica habitualmente como se lo haya indicado el mdico. Esto puede incluir lo siguiente: ? semanales de ejercicio de intensidad moderada o alta. Esto podra incluir caminatas dinmicas, ciclismo o gimnasia acutica. ? Realizar ejercicios de elongacin y de fortalecimiento, como yoga o levantamiento de pesas, por lo menos 2veces por semana.  Tome los [employer] se lo haya indicado el mdico.  No consuma ningn producto que contenga nicotina o tabaco, como cigarrillos y Administrator, Civil Service. Si necesita ayuda para dejar de fumar, consulte al CIGNA con un asesor o instructor en diabetes para identificar estrategias para controlar el estrs y cualquier desafo emocional y social. Preguntas para hacerle al mdico  Es necesario que consulte a IT trainer en el cuidado de la diabetes?  Es necesario que me rena con un nutricionista?  A qu nmero puedo llamar si tengo preguntas?  Cules son los mejores momentos para Chief Operating Officer  la glucemia? Dnde encontrar ms informacin:  Asociacin Estadounidense de la Diabetes (American Diabetes Association): diabetes.org  Academia de Nutricin y Pension scheme managerDiettica (Academy of Nutrition and Dietetics): www.eatright.org  The Krogernstituto Nacional de la Diabetes y las Enfermedades Digestivas y [facility](National Institute of Diabetes and Digestive and Kidney Diseases, NIH): CarFlippers.tnwww.niddk.nih.gov Resumen  Un plan de alimentacin saludable lo ayudar a Scientist, physiologicalcontrolar la glucemia y Pharmacologistmantener un estilo de vida saludable.  Trabajar con un especialista en dietas y nutricin (nutricionista) puede ayudarlo a Designer, television/film setelaborar el mejor plan de alimentacin para usted.  Tenga en cuenta que los carbohidratos (hidratos de carbono) y el alcohol tienen efectos  inmediatos en sus niveles de glucemia. Es importante contar los carbohidratos que ingiere y consumir alcohol con prudencia. Esta informacin no tiene Theme park managercomo fin reemplazar el consejo del mdico. Asegrese de hacerle al mdico cualquier pregunta que tenga. Document Released: 06/11/2007 Document Revised: 11/12/2016 Document Reviewed: 06/24/2016 Elsevier Patient Education  2020 [employer]

## 2019-03-15 NOTE — Progress Notes (Signed)
Lab Results  Component Value Date   HGBA1C 8.3 (H) 12/31/2018   BP Readings from Last 3 Encounters:  03/15/19 123/82  01/06/19 112/82  12/31/18 126/88   Wt Readings from Last 3 Encounters:  03/15/19 193 lb (87.5 kg)  01/05/19 181 lb 4 oz (82.2 kg)  12/31/18 181 lb 4 oz (82.2 kg)   Isaac Hall 48 y.o.   Chief Complaint  Patient presents with  . Diabetes    follow up  . Medication Refill    Metformin and Stye on Left eye x 2 months    HISTORY OF PRESENT ILLNESS: This is a 48 y.o. male with history of diabetes here for follow-up.  Presently taking Metformin 500 mg twice a day.  Needs medication refill. Status post lumbar spine surgery last October.  Still complaining of some numbness to left leg.  Has appointment to see neurosurgeon on 03/24/2019. Also complaining of left eye stye for 2 months.  HPI   Prior to Admission medications   Medication Sig Start Date End Date Taking? Authorizing Provider  atorvastatin (LIPITOR) 10 MG tablet Take 1 tablet (10 mg total) by mouth daily. 09/15/18  Yes , Eilleen Kempf, MD  diclofenac (VOLTAREN) 75 MG EC tablet Take 75 mg by mouth 2 (two) times daily.   Yes [provider]  lisinopril (ZESTRIL) 5 MG tablet Take 1 tablet (5 mg total) by mouth daily. 09/15/18  Yes Georgina Quint, MD  metFORMIN (GLUCOPHAGE) 500 MG tablet Take 1 tablet (500 mg total) by mouth 2 (two) times daily with a meal. 07/29/17  Yes Wallis Bamberg, PA-C  pregabalin (LYRICA) 75 MG capsule Take 75 mg by mouth 3 (three) times daily.   Yes [provider]  citalopram (CELEXA) 20 MG tablet Take 1 tablet (20 mg total) by mouth daily. 09/15/18 12/28/18  Georgina Quint, MD  cyclobenzaprine (FLEXERIL) 10 MG tablet Take 1 tablet (10 mg total) by mouth 2 (two) times daily as needed. Patient not taking: Reported on 03/15/2019 11/16/18   Michela Pitcher A, PA-C  gabapentin (NEURONTIN) 300 MG capsule Take 300 mg by mouth 2 (two) times daily. 09/23/18    [provider]  lidocaine (LIDODERM) 5 % Place 1 patch onto the skin daily. Remove & Discard patch within 12 hours or as directed by MD Patient not taking: Reported on 03/15/2019 11/16/18   Michela Pitcher A, PA-C  meloxicam (MOBIC) 15 MG tablet TAKE 1 TABLET BY MOUTH EVERY DAY Patient not taking: Reported on 03/15/2019 10/08/18   Georgina Quint, MD  methocarbamol (ROBAXIN) 500 MG tablet Take 500 mg by mouth 2 (two) times daily.    [provider]  methocarbamol (ROBAXIN) 500 MG tablet Take 1 tablet (500 mg total) by mouth every 6 (six) hours as needed for muscle spasms. Patient not taking: Reported on 03/15/2019 01/06/19   Barnett Abu, MD  [facility] DELICA LANCETS 33G MISC  08/01/17   [provider]  oxyCODONE-acetaminophen (PERCOCET/ROXICET) 5-325 MG tablet Take 1-2 tablets by mouth every 4 (four) hours as needed for moderate pain or severe pain. Patient not taking: Reported on 03/15/2019 01/06/19   Barnett Abu, MD  tobramycin (TOBREX) 0.3 % ophthalmic solution Place 1 drop into the left eye 2 (two) times daily as needed (stye).     [provider]    No Known Allergies  Patient Active Problem List   Diagnosis Date Noted  . Herniated nucleus pulposus, L5-S1, left 01/05/2019    Past Medical History:  Diagnosis  Date  . Anxiety   . Arthritis   . Back pain   . Diabetes mellitus without complication (HCC)   . Hypertension   . Lower extremity pain 08/2018    Past Surgical History:  Procedure Laterality Date  . ABDOMINAL EXPOSURE N/A 01/05/2019   Procedure: ABDOMINAL EXPOSURE;  Surgeon: Chuck Hint, MD;  Location: [facility] OR;  Service: Vascular;  Laterality: N/A;  . ANTERIOR LUMBAR FUSION N/A 01/05/2019   Procedure: Lumbar five Sacral one Anterior lumbar interbody fusion;  Surgeon: Barnett Abu, MD;  Location: [facility] OR;  Service: Neurosurgery;  Laterality: N/A;  . VASECTOMY      Social History   Socioeconomic History  . Marital  status: Married    Spouse name: Mary  . Number of children: 2  . Years of education: 12th grade  . Highest education level: Not on file  Occupational History  . Occupation: MACHINE OPERATOR    Employer: [employer]  Tobacco Use  . Smoking status: Never Smoker  . Smokeless tobacco: Never Used  Substance and Sexual Activity  . Alcohol use: No  . Drug use: No  . Sexual activity: Not on file  Other Topics Concern  . Not on file  Social History Narrative   Lives with his wife and their 2 children.   Originally from [place].  Came to the [place] in 1995.   Social Determinants of Health   Financial Resource Strain:   . Difficulty of Paying Living Expenses: Not on file  Food Insecurity:   . Worried About Programme researcher, broadcasting/film/video in the Last Year: Not on file  . Ran Out of Food in the Last Year: Not on file  Transportation Needs:   . Lack of Transportation (Medical): Not on file  . Lack of Transportation (Non-Medical): Not on file  Physical Activity:   . Days of Exercise per Week: Not on file  . Minutes of Exercise per Session: Not on file  Stress:   . Feeling of Stress : Not on file  Social Connections:   . Frequency of Communication with Friends and Family: Not on file  . Frequency of Social Gatherings with Friends and Family: Not on file  . Attends Religious Services: Not on file  . Active Member of Clubs or Organizations: Not on file  . Attends Banker Meetings: Not on file  . Marital Status: Not on file  Intimate Partner Violence:   . Fear of Current or Ex-Partner: Not on file  . Emotionally Abused: Not on file  . Physically Abused: Not on file  . Sexually Abused: Not on file    Family History  Problem Relation Age of Onset  . Cancer Mother   . Diabetes Mother   . Diabetes Father   . Diabetes Sister   . Diabetes Sister   . Diabetes Sister   . Diabetes Sister   . Hypertension Sister      Review of Systems  Constitutional: Negative.  Negative for  chills and fever.  HENT: Negative.  Negative for congestion and sore throat.   Eyes: Negative.   Respiratory: Negative.  Negative for cough and shortness of breath.   Cardiovascular: Negative.  Negative for chest pain and palpitations.  Gastrointestinal: Negative.  Negative for abdominal pain, blood in stool, diarrhea, melena, nausea and vomiting.  Genitourinary: Negative.  Negative for dysuria and hematuria.  Musculoskeletal: Negative.  Negative for myalgias and neck pain.  Skin: Negative.  Negative for rash.  Neurological: Negative.  Negative  for dizziness and headaches.  All other systems reviewed and are negative.  Today's Vitals   03/15/19 1454  BP: 123/82  Pulse: 87  Resp: 16  Temp: 98.2 F (36.8 C)  TempSrc: Oral  SpO2: 97%  Weight: 193 lb (87.5 kg)  Height:  (1.6 m)   Body mass index is 34.19 kg/m.   Physical Exam Vitals reviewed.  Constitutional:      Appearance: Normal appearance.  HENT:     Head: Normocephalic.  Eyes:     Extraocular Movements: Extraocular movements intact.     Conjunctiva/sclera: Conjunctivae normal.     Pupils: Pupils are equal, round, and reactive to light.     Comments: Left upper eyelid lateral chalazion  Cardiovascular:     Rate and Rhythm: Normal rate and regular rhythm.     Pulses: Normal pulses.     Heart sounds: Normal heart sounds.  Pulmonary:     Effort: Pulmonary effort is normal.     Breath sounds: Normal breath sounds.  Abdominal:     Palpations: Abdomen is soft.     Tenderness: There is no abdominal tenderness.  Musculoskeletal:        General: Normal range of motion.     Cervical back: Normal range of motion and neck supple.  Skin:    General: Skin is warm and dry.     Capillary Refill: Capillary refill takes less than 2 seconds.  Neurological:     General: No focal deficit present.     Mental Status: He is alert and oriented to person, place, and time.  Psychiatric:        Mood and Affect: Mood normal.         Behavior: Behavior normal.    Results for orders placed or performed in visit on 03/15/19 (from the past 24 hour(s))  POCT glucose (manual entry)     Status: Abnormal   Collection Time: 03/15/19  3:18 PM  Result Value Ref Range   POC Glucose 188 (A) 70 - 99 mg/dl  POCT glycosylated hemoglobin (Hb A1C)     Status: Abnormal   Collection Time: 03/15/19  3:24 PM  Result Value Ref Range   Hemoglobin A1C 6.9 (A) 4.0 - 5.6 %   HbA1c POC (<> result, manual entry)     HbA1c, POC (prediabetic range)     HbA1c, POC (controlled diabetic range)       ASSESSMENT & PLAN: Type 2 diabetes mellitus with hyperglycemia, without long-term current use of insulin (HCC) Controlled diabetes with hemoglobin A1c at 6.9 better than last October at 8.3.  Continue Metformin 500 mg twice a day with proper diet and nutrition as discussed.  Increase physical activity.  Continue statin and low-dose lisinopril.  Follow-up in 3 months.  Ona was seen today for diabetes and medication refill.  Diagnoses and all orders for this visit:  Type 2 diabetes mellitus with hyperglycemia, without long-term current use of insulin (HCC) -     POCT glucose (manual entry) -     POCT glycosylated hemoglobin (Hb A1C) -     HM Diabetes Foot Exam -     Comprehensive metabolic panel -     Lipid panel -     Ambulatory referral to Ophthalmology -     metFORMIN (GLUCOPHAGE) 500 MG tablet; Take 1 tablet (500 mg total) by mouth 2 (two) times daily with a meal.  Need for prophylactic vaccination and inoculation against influenza -     Flu Vaccine  QUAD 36+ mos IM  Chalazion left upper eyelid -     Ambulatory referral to Ophthalmology    Patient Instructions       If you have lab work done today you will be contacted with your lab results within the next 2 weeks.  If you have not heard from [place] then please contact us. The fastest way to get your results is to register for My Chart.   IF you received an x-ray today, you will  receive an invoice from [facility]. Please contact [facility] at [phone] with questions or concerns regarding your invoice.   IF you received labwork today, you will receive an invoice from [facility]. Please contact [facility] at [phone] with questions or concerns regarding your invoice.   Our billing staff will not be able to assist you with questions regarding bills from these companies.  You will be contacted with the lab results as soon as they are available. The fastest way to get your results is to activate your My Chart account. Instructions are located on the last page of this paperwork. If you have not heard from [place] regarding the results in 2 weeks, please contact this office.    Chalacin Chalazion  Un chalacin es una inflamacin o una tumoracin en el prpado. Puede afectar al prpado superior o al inferior. Cules son las causas? Esta afeccin puede ser causada por lo siguiente:  La inflamacin crnica de las glndulas del prpado.  La obstruccin de una glndula sebcea en el prpado. Cules son los signos o los sntomas? Los sntomas de esta afeccin incluyen:  Hinchazn del prpado. La hinchazn se puede extender a otras zonas alrededor del ojo.  Un bulto duro en el prpado.  Visin borrosa. El bulto en el prpado puede puede dificultar la visin. Cmo se diagnostica? Esta afeccin se diagnostica con un examen ocular. Cmo se trata? La afeccin se trata mediante la aplicacin de compresas tibias en el prpado. Si la afeccin no mejora, el tratamiento puede incluir lo siguiente:  Medicamentos que un mdico Estate agent.  [place].  Medicamentos que se Sport and exercise psychologist. Siga estas indicaciones en su casa: Control del dolor y de la hinchazn  Aplquese una compresa tibia y hmeda en el prpado 4 o 6veces al da durante 10 a cada vez. Esto ayudar a destapar las glndulas obstruidas y a Dispensing optician  y la inflamacin.  Aplquese los medicamentos de venta libre y los recetados solamente como se lo haya indicado el mdico. Indicaciones generales  No se toque el chalacin.  No trate de extraer el pus. No apriete el chalacin ni lo pinche con un alfiler o una aguja.  No se frote los ojos.  Lvese las manos con frecuencia. Squelas con una toalla limpia.  Mantenga el rostro, el cuero cabelludo y las cejas limpios.  No use maquillaje.  Si el chalacin no se rompe (ruptura) por s solo, regrese al mdico.  Concurra a todas las visitas de control como se lo haya indicado el mdico. Esto es importante. Comunquese con un mdico si:  El prpado no ha mejorado despus de 4semanas.  El prpado est empeorando.  Tiene fiebre.  El chalacin no se rompe por s solo despus de un mes de Medical laboratory scientific officer. Solicite ayuda inmediatamente si:  Environmental education officer.  Presenta cambios en la visin.  El chalacin le causa dolor o est enrojecido.  El chalacin se agranda. Resumen  Un chalacin  es una inflamacin o un bulto en el prpado superior o inferior.  Puede estar causado por una inflamacin crnica o por la obstruccin de una glndula sebcea.  Aplquese una compresa tibia y hmeda en el prpado 4 o 6veces al da durante 10 a 34minutos cada vez.  Mantenga el rostro, el cuero cabelludo y las cejas limpios. Esta informacin no tiene Marine scientist el consejo del mdico. Asegrese de hacerle al mdico cualquier pregunta que tenga. Document Released: 03/04/2005 Document Revised: 10/13/2017 Document Reviewed: 10/13/2017 Elsevier Patient Education  2020 [employer].  Diabetes mellitus y nutricin, en adultos Diabetes Mellitus and Nutrition, Adult Si sufre de diabetes (diabetes mellitus), es muy importante tener hbitos alimenticios saludables debido a que sus niveles de Designer, television/film set sangre (glucosa) se ven afectados en gran medida por lo que come y bebe. [place]  alimentos saludables en las cantidades [place], aproximadamente a la [employer], [place] ayudar a:  Aeronautical engineer glucemia.  Disminuir el riesgo de sufrir una enfermedad cardaca.  Mejorar la presin arterial.  Science writer o mantener un peso saludable. Todas las personas que sufren de diabetes son diferentes y cada una tiene necesidades diferentes en cuanto a un plan de alimentacin. El mdico puede recomendarle que trabaje con un especialista en dietas y nutricin (nutricionista) para Financial trader plan para usted. Su plan de alimentacin puede variar segn factores como:  Las caloras que necesita.  Los medicamentos que toma.  Su peso.  Sus niveles de glucemia, presin arterial y colesterol.  Su nivel de [place].  Otras afecciones que tenga, como enfermedades cardacas o renales. Cmo me afectan los carbohidratos? Los carbohidratos, o hidratos de carbono, afectan su nivel de glucemia ms que cualquier otro tipo de alimento. La ingesta de carbohidratos naturalmente aumenta la cantidad de [employer]. El recuento de carbohidratos es un mtodo destinado a Catering manager un registro de la cantidad de carbohidratos que se consumen. El recuento de carbohidratos es importante para Theatre manager la glucemia a un nivel saludable, especialmente si utiliza insulina o toma determinados medicamentos por va oral para la diabetes. Es importante conocer la cantidad de carbohidratos que se pueden ingerir en cada comida sin correr Engineer, manufacturing. Esto es Psychologist, forensic. Su nutricionista puede ayudarlo a calcular la cantidad de carbohidratos que debe ingerir en cada comida y en cada refrigerio. Entre los alimentos que contienen carbohidratos, se incluyen:  Pan, cereal, arroz, pastas y galletas.  Papas y maz.  Guisantes, frijoles y lentejas.  Leche y Estate agent.  Lambert Mody y [place].  Postres, como pasteles, galletas, helado y caramelos. Cmo me afecta el alcohol? El alcohol puede  provocar disminuciones sbitas de la glucemia (hipoglucemia), especialmente si utiliza insulina o toma determinados medicamentos por va oral para la diabetes. La hipoglucemia es una afeccin potencialmente mortal. Los sntomas de la hipoglucemia (somnolencia, mareos y confusin) son similares a los sntomas de haber consumido demasiado alcohol. Si el mdico afirma que el alcohol es seguro para usted, [place] estas pautas:  Limite el consumo de alcohol a no ms de 94medida por da si es mujer y no est [place], y a 69medidas si es hombre. Una medida equivale a 12oz (346ml) de cerveza, 5oz (161ml) de vino o 1oz (66ml) de bebidas alcohlicas de alta graduacin.  No beba con el estmago vaco.  Mantngase hidratado bebiendo agua, refrescos dietticos o t helado sin azcar.  Tenga en cuenta que los refrescos comunes, los jugos y otras bebida para Optician, dispensing pueden Clinical biochemist  y se deben contar como carbohidratos. Cules son algunos consejos para seguir este plan?  Leer las etiquetas de los alimentos  Comience por leer el tamao de la porcin en la "Informacin nutricional" en las etiquetas de los alimentos envasados y las bebidas. La cantidad de caloras, carbohidratos, grasas y otros nutrientes mencionados en la etiqueta se basan en una porcin del alimento. Muchos alimentos contienen ms de una porcin por envase.  Verifique la cantidad total de gramos (g) de carbohidratos totales en una porcin. Puede calcular la cantidad de porciones de carbohidratos al dividir el total de carbohidratos por 15. Por ejemplo, si un alimento tiene un total de 30g de carbohidratos, equivale a 2 porciones de carbohidratos.  Verifique la cantidad de gramos (g) de grasas saturadas y grasas trans en una porcin. Escoja alimentos que no contengan grasa o que tengan un bajo contenido.  Verifique la cantidad de miligramos (mg) de sal (sodio) en una porcin. La mayora de las personas deben limitar la  ingesta de sodio total a menos de 2300mg  por Futures traderda.  Siempre consulte la informacin nutricional de los alimentos etiquetados como "con bajo contenido de grasa" o "sin grasa". Estos alimentos pueden tener un mayor contenido de International aid/development workerazcar agregada o carbohidratos refinados, y deben evitarse.  Hable con su nutricionista para identificar sus objetivos diarios en cuanto a los nutrientes mencionados en la etiqueta. Al ir de compras  Evite comprar alimentos procesados, enlatados o precocinados. Estos alimentos tienden a Counselling psychologisttener una mayor cantidad de [place]asa, sodio y azcar agregada.  Compre en la zona exterior de la tienda de comestibles. Esta zona incluye frutas y verduras frescas, granos a granel, carnes frescas y productos lcteos frescos. Al cocinar  Utilice mtodos de coccin a baja temperatura, como hornear, en lugar de mtodos de coccin a alta temperatura, como frer en abundante aceite.  Cocine con aceites saludables, como el aceite de [place]oliva, canola o [place]iffgirasol.  Evite cocinar con manteca, crema o carnes con alto contenido de grasa. Planificacin de las comidas  Coma las comidas y los refrigerios regularmente, preferentemente a la misma hora todos [place]los das. Evite pasar largos perodos de tiempo sin comer.  Consuma alimentos ricos en fibra, como frutas frescas, verduras, frijoles y cereales integrales. Consulte a su nutricionista sobre cuntas porciones de carbohidratos puede consumir en cada comida.  Consuma entre 4 y 6 onzas (oz) de protenas magras por da, como carnes [place]magras, pollo, pescado, huevos o tofu. Una onza de protena magra equivale a: ? 1 onza de carne, pollo o pescado. ? 1huevo. ?  taza de tofu.  Coma algunos alimentos por da que contengan grasas saludables, como aguacates, frutos secos, semillas y pescado. Estilo de vida  Controle su nivel de glucemia con regularidad.  Haga actividad fsica habitualmente como se lo haya indicado el mdico. Esto puede incluir lo  siguiente: ? 150minutos semanales de ejercicio de intensidad moderada o alta. Esto podra incluir caminatas dinmicas, ciclismo o gimnasia acutica. ? Realizar ejercicios de elongacin y de fortalecimiento, como yoga o levantamiento de pesas, por lo menos 2veces por semana.  Tome los Monsanto Companymedicamentos como se lo haya indicado el mdico.  No consuma ningn producto que contenga nicotina o tabaco, como cigarrillos y Administrator, Civil Servicecigarrillos electrnicos. Si necesita ayuda para dejar de fumar, consulte al CIGNAmdico.  Trabaje con un asesor o instructor en diabetes para identificar estrategias para controlar el estrs y cualquier desafo emocional y social. Preguntas para hacerle al mdico  Es necesario que consulte a IT trainerun instructor en el cuidado de la  diabetes?  Es necesario que me rena con un nutricionista?  A qu nmero puedo llamar si tengo preguntas?  Cules son los mejores momentos para controlar la glucemia? Dnde encontrar ms informacin:  Asociacin Estadounidense de la Diabetes (American Diabetes Association): diabetes.org  Academia de Nutricin y Pension scheme manager (Academy of Nutrition and Dietetics): www.eatright.org  [employer] de la Diabetes y las Enfermedades Digestivas y [place] [facility] of Diabetes and Digestive and Kidney Diseases, NIH): CarFlippers.tn Resumen  Un plan de alimentacin saludable lo ayudar a Scientist, physiological glucemia y Pharmacologist un estilo de vida saludable.  Trabajar con un especialista en dietas y nutricin (nutricionista) puede ayudarlo a Designer, television/film set de alimentacin para usted.  Tenga en cuenta que los carbohidratos (hidratos de carbono) y el alcohol tienen efectos inmediatos en sus niveles de glucemia. Es importante contar los carbohidratos que ingiere y consumir alcohol con prudencia. Esta informacin no tiene Theme park manager el consejo del mdico. Asegrese de hacerle al mdico cualquier pregunta que tenga. Document Released: 06/11/2007  Document Revised: 11/12/2016 Document Reviewed: 06/24/2016 Elsevier Patient Education  2020 Elsevier Inc.      Edwina Barth, MD [facility]

## 2019-03-15 NOTE — Assessment & Plan Note (Signed)
Controlled diabetes with hemoglobin A1c at 6.9 better than last October at 8.3.  Continue Metformin 500 mg twice a day with proper diet and nutrition as discussed.  Increase physical activity.  Continue statin and low-dose lisinopril.  Follow-up in 3 months.

## 2019-03-16 ENCOUNTER — Other Ambulatory Visit: Payer: Self-pay | Admitting: Emergency Medicine

## 2019-03-16 ENCOUNTER — Telehealth: Payer: Self-pay | Admitting: Emergency Medicine

## 2019-03-16 ENCOUNTER — Encounter: Payer: Self-pay | Admitting: Emergency Medicine

## 2019-03-16 LAB — LIPID PANEL
Chol/HDL Ratio: 6.1 ratio — ABNORMAL HIGH (ref 0.0–5.0)
Cholesterol, Total: 147 mg/dL (ref 100–199)
HDL: 24 mg/dL — ABNORMAL LOW (ref >39)
LDL Chol Calc (NIH): 37 mg/dL (ref 0–99)
Triglycerides: 613 mg/dL (ref 0–149)
VLDL Cholesterol Cal: 86 mg/dL — ABNORMAL HIGH (ref 5–40)

## 2019-03-16 LAB — COMPREHENSIVE METABOLIC PANEL
ALT: 57 IU/L — ABNORMAL HIGH (ref 0–44)
AST: 34 IU/L (ref 0–40)
Albumin/Globulin Ratio: 1.8 (ref 1.2–2.2)
Albumin: 4.6 g/dL (ref 4.0–5.0)
Alkaline Phosphatase: 102 IU/L (ref 39–117)
BUN/Creatinine Ratio: 13 (ref 9–20)
BUN: 11 mg/dL (ref 6–24)
Bilirubin Total: 0.2 mg/dL (ref 0.0–1.2)
CO2: 22 mmol/L (ref 20–29)
Calcium: 9.8 mg/dL (ref 8.7–10.2)
Chloride: 101 mmol/L (ref 96–106)
Creatinine, Ser: 0.88 mg/dL (ref 0.76–1.27)
GFR calc Af Amer: 117 mL/min/{1.73_m2} (ref >59)
GFR calc non Af Amer: 102 mL/min/{1.73_m2} (ref >59)
Globulin, Total: 2.5 g/dL (ref 1.5–4.5)
Glucose: 178 mg/dL — ABNORMAL HIGH (ref 65–99)
Potassium: 4.1 mmol/L (ref 3.5–5.2)
Sodium: 137 mmol/L (ref 134–144)
Total Protein: 7.1 g/dL (ref 6.0–8.5)

## 2019-03-16 MED ORDER — ATORVASTATIN CALCIUM 40 MG PO TABS: 40.0000 mg | ORAL_TABLET | Freq: Every day | ORAL | 3 refills | Status: DC

## 2019-03-16 NOTE — Telephone Encounter (Signed)
Attempted to call about lab results. No answer.

## 2019-03-24 DIAGNOSIS — M5137 Other intervertebral disc degeneration, lumbosacral region: Secondary | ICD-10-CM | POA: Diagnosis not present

## 2019-03-25 ENCOUNTER — Other Ambulatory Visit (HOSPITAL_COMMUNITY): Payer: Self-pay | Admitting: Neurological Surgery

## 2019-03-25 ENCOUNTER — Other Ambulatory Visit: Payer: Self-pay | Admitting: Neurological Surgery

## 2019-03-25 DIAGNOSIS — M79605 Pain in left leg: Secondary | ICD-10-CM

## 2019-03-26 DIAGNOSIS — H0014 Chalazion left upper eyelid: Secondary | ICD-10-CM | POA: Diagnosis not present

## 2019-03-26 DIAGNOSIS — H40053 Ocular hypertension, bilateral: Secondary | ICD-10-CM | POA: Diagnosis not present

## 2019-03-26 DIAGNOSIS — H5212 Myopia, left eye: Secondary | ICD-10-CM | POA: Diagnosis not present

## 2019-03-26 DIAGNOSIS — E119 Type 2 diabetes mellitus without complications: Secondary | ICD-10-CM | POA: Diagnosis not present

## 2019-03-26 LAB — HM DIABETES EYE EXAM

## 2019-03-31 ENCOUNTER — Encounter: Payer: Self-pay | Admitting: *Deleted

## 2019-03-31 DIAGNOSIS — H0014 Chalazion left upper eyelid: Secondary | ICD-10-CM | POA: Diagnosis not present

## 2019-04-21 ENCOUNTER — Ambulatory Visit (HOSPITAL_COMMUNITY)
Admission: RE | Admit: 2019-04-21 | Discharge: 2019-04-21 | Disposition: A | Discharge status: Home / Self Care | Payer: BC Managed Care – PPO | Source: Ambulatory Visit | Attending: Neurological Surgery | Admitting: Neurological Surgery

## 2019-04-21 ENCOUNTER — Other Ambulatory Visit: Payer: Self-pay

## 2019-04-21 DIAGNOSIS — K760 Fatty (change of) liver, not elsewhere classified: Secondary | ICD-10-CM | POA: Diagnosis not present

## 2019-04-21 DIAGNOSIS — M5127 Other intervertebral disc displacement, lumbosacral region: Secondary | ICD-10-CM | POA: Diagnosis not present

## 2019-04-21 DIAGNOSIS — Z981 Arthrodesis status: Secondary | ICD-10-CM | POA: Diagnosis not present

## 2019-04-21 DIAGNOSIS — M79605 Pain in left leg: Secondary | ICD-10-CM | POA: Insufficient documentation

## 2019-04-21 DIAGNOSIS — M5416 Radiculopathy, lumbar region: Secondary | ICD-10-CM | POA: Diagnosis not present

## 2019-04-21 DIAGNOSIS — M5126 Other intervertebral disc displacement, lumbar region: Secondary | ICD-10-CM | POA: Diagnosis not present

## 2019-04-21 LAB — GLUCOSE, CAPILLARY: Glucose-Capillary: 169 mg/dL — ABNORMAL HIGH (ref 70–99)

## 2019-04-21 MED ORDER — ONDANSETRON HCL 4 MG/2ML IJ SOLN: 4.0000 mg | Freq: Four times a day (QID) | INTRAMUSCULAR | Status: DC | PRN

## 2019-04-21 MED ORDER — IOHEXOL 180 MG/ML  SOLN
20.0000 mL | Freq: Once | INTRAMUSCULAR | Status: AC | PRN
  Administered 2019-04-21: 09:00:00 12 mL via INTRATHECAL

## 2019-04-21 MED ORDER — LIDOCAINE HCL (PF) 1 % IJ SOLN
5.0000 mL | Freq: Once | INTRAMUSCULAR | Status: AC
  Administered 2019-04-21: 09:00:00 5 mL via INTRADERMAL

## 2019-04-21 MED ORDER — DIAZEPAM 5 MG PO TABS
ORAL_TABLET | ORAL | Status: AC
  Filled 2019-04-21: qty 2

## 2019-04-21 MED ORDER — OXYCODONE-ACETAMINOPHEN 5-325 MG PO TABS: 1.0000 | ORAL_TABLET | ORAL | Status: DC | PRN

## 2019-04-21 MED ORDER — OXYCODONE-ACETAMINOPHEN 5-325 MG PO TABS
ORAL_TABLET | ORAL | Status: AC
  Administered 2019-04-21: 09:00:00 1 via ORAL
  Filled 2019-04-21: qty 1

## 2019-04-21 MED ORDER — DIAZEPAM 5 MG PO TABS
10.0000 mg | ORAL_TABLET | Freq: Once | ORAL | Status: AC
  Administered 2019-04-21: 10 mg via ORAL

## 2019-04-21 NOTE — Procedures (Signed)
Mr. Isaac Hall Carlynn Purl is a 49 year old individual who had severe degenerative changes at L5-S1 with herniated nucleus pulposus.  Underwent anterior decompression arthrodesis and has done well but has had persistent left lower extremity pain myelogram is now being performed to make sure that he has had adequate decompression and stabilization of his lumbar spine to see if there is any other new pathology myelogram is being performed because the patient has had significant implantation of a titanium mass just in front of L5-S1 which would make MRI visualization of this area in particular difficult.  Pre op Dx: Chronic left lumbar radiculopathy, history of fusion L5-S1 Post op Dx: Same Procedure: Lumbar myelogram Surgeon:  Puncture level: L3-4 Fluid color: Isovue 180, 12 mL Injection: Clear colorless Findings: No obvious nerve root cut off or stenosis.  Further evaluation of fusion area and surrounding areas with CT scanning of the lumbar spine.

## 2019-04-21 NOTE — Discharge Instructions (Signed)
Myelogram  A myelogram is an imaging study of the spinal cord and the places where nerves attach to the spinal cord (nerve roots). A dye (contrast material) is injected into the spine before the X-ray. This provides a clearer image for your health care provider to see. You may need this study done if you have a spinal cord problem that cannot be diagnosed with other imaging studies, such as a CT scan or an MRI. You may also have this study to check your spine after surgery. Tell a health care provider about:  Any allergies you have, especially to iodine.  All medicines you are taking, including vitamins, herbs, eye drops, creams, and over-the-counter medicines.  Any problems you or family members have had with anesthetic medicines or contrast material.  Any blood disorders you have.  Any surgeries you have had.  Any medical conditions you have or have had, including asthma.  Whether you are pregnant or may be pregnant. What are the risks? Generally, this is a safe procedure. However, problems may occur, including:  Infection.  Bleeding.  Allergic reaction to medicines or dyes.  Damage to your spinal cord or nerves.  Loss or leaking of spinal fluid. This can lead to headaches.  Damage to kidneys.  Seizures. This is rare. What happens before the procedure?  Follow instructions from your health care provider about eating or drinking restrictions. You may be asked to drink more fluids.  Ask your health care provider about changing or stopping your regular medicines. This is especially important if you are taking diabetes medicines or blood thinners.  Plan to have someone take you home from the hospital or clinic.  If you will be going home right after the procedure, plan to have someone with you for 24 hours. What happens during the procedure?  You will lie face down on a table.  Your health care provider will locate the best injection site on your spine. This is most  often in the lower back.  The area of injection will be washed with soap.  You will be given a medicine to numb the area (local anesthetic).  Your health care provider will insert a long needle into the space around your spinal cord (subarachnoid space).  A sample of spinal fluid may be taken and sent to the lab for testing.  The contrast material will be injected into the subarachnoid space.  The exam table may be tilted to help the contrast material flow up or down your spine.  The X-ray will take images of your spinal cord for your health care provider to examine.  A bandage (dressing) may be placed over the injection site. The procedure may vary among health care providers and hospitals. What can I expect after this procedure?  Your blood pressure, heart rate, breathing rate, and blood oxygen level may be monitored until you leave the hospital or clinic.  You may have: ? Soreness on your injection site. ? A mild headache.  You will be asked to lie down with your head raised (elevated). This reduces the risk of a headache.  It is up to you to get the results of your procedure. Ask your health care provider, or the department that is doing the procedure, when your results will be ready. Follow these instructions at home:   Rest as told by your health care provider. Lie flat with your head slightly elevated to reduce the risk of a headache.  Do not bend, lift, or do hard work   for 24-48 hours, or as told by your health care provider.  Take over-the-counter and prescription medicines only as told by your health care provider.  Take care of your dressing as told by your health care provider.  Drink enough fluid to keep your urine pale yellow.  Bathe or shower as told by your health care provider. Contact a health care provider if:  You have a fever.  You have a headache that lasts longer than 24 hours.  You feel nauseous or vomit.  You have a stiff neck or numbness in  your legs.  You are unable to urinate or have a bowel movement.  You develop a rash, itching, or sneezing. Get help right away if:  You have new symptoms or your symptoms get worse.  You have a seizure.  You have trouble breathing. Summary  A myelogram is an imaging study of the spinal cord and the places where nerves attach to the spinal cord (nerve roots).  Before the procedure, follow instructions from your health care provider about changing or stopping your regular medicines, and eating and drinking restrictions.  After this procedure, you will be asked to lie down with your head raised (elevated). This reduces your risk of a headache.  Do not bend, lift, or do hard work for 24-48 hours, or as told by your health care provider.  Contact a health care provider if you have a stiff neck or numbness in your legs. Get help right away if symptoms get worse, or you have a seizure or trouble breathing. This information is not intended to replace advice given to you by your health care provider. Make sure you discuss any questions you have with your health care provider. Document Revised: 05/13/2018 Document Reviewed: 05/14/2018 Elsevier Patient Education  2020 Elsevier Inc.  

## 2019-04-21 NOTE — Progress Notes (Signed)
Ambulated to bathroom to void tol well. Discharge instructions reviewed with pt and his wife (via telephone) both voice understanding.

## 2019-04-23 DIAGNOSIS — Z6833 Body mass index (BMI) 33.0-33.9, adult: Secondary | ICD-10-CM | POA: Diagnosis not present

## 2019-04-23 DIAGNOSIS — M5126 Other intervertebral disc displacement, lumbar region: Secondary | ICD-10-CM | POA: Diagnosis not present

## 2019-05-07 DIAGNOSIS — Z1152 Encounter for screening for COVID-19: Secondary | ICD-10-CM | POA: Diagnosis not present

## 2019-05-13 DIAGNOSIS — Z981 Arthrodesis status: Secondary | ICD-10-CM | POA: Diagnosis not present

## 2019-05-13 DIAGNOSIS — M5416 Radiculopathy, lumbar region: Secondary | ICD-10-CM | POA: Diagnosis not present

## 2019-05-13 DIAGNOSIS — M5126 Other intervertebral disc displacement, lumbar region: Secondary | ICD-10-CM | POA: Diagnosis not present

## 2019-05-13 DIAGNOSIS — M48061 Spinal stenosis, lumbar region without neurogenic claudication: Secondary | ICD-10-CM | POA: Diagnosis not present

## 2019-05-13 DIAGNOSIS — M2578 Osteophyte, vertebrae: Secondary | ICD-10-CM | POA: Diagnosis not present

## 2019-05-13 DIAGNOSIS — T85820A Fibrosis due to nervous system prosthetic devices, implants and grafts, initial encounter: Secondary | ICD-10-CM | POA: Diagnosis not present

## 2019-06-01 DIAGNOSIS — M25552 Pain in left hip: Secondary | ICD-10-CM | POA: Diagnosis not present

## 2019-06-01 DIAGNOSIS — R030 Elevated blood-pressure reading, without diagnosis of hypertension: Secondary | ICD-10-CM | POA: Diagnosis not present

## 2019-06-01 DIAGNOSIS — M7062 Trochanteric bursitis, left hip: Secondary | ICD-10-CM | POA: Diagnosis not present

## 2019-06-01 DIAGNOSIS — Z6832 Body mass index (BMI) 32.0-32.9, adult: Secondary | ICD-10-CM | POA: Diagnosis not present

## 2019-06-03 ENCOUNTER — Other Ambulatory Visit: Payer: Self-pay | Admitting: Neurological Surgery

## 2019-06-03 DIAGNOSIS — M25552 Pain in left hip: Secondary | ICD-10-CM

## 2019-06-14 ENCOUNTER — Other Ambulatory Visit: Payer: BC Managed Care – PPO

## 2019-06-14 ENCOUNTER — Ambulatory Visit: Payer: BC Managed Care – PPO | Admitting: Emergency Medicine

## 2019-06-15 ENCOUNTER — Encounter: Payer: Self-pay | Admitting: Emergency Medicine

## 2019-07-09 DIAGNOSIS — M5126 Other intervertebral disc displacement, lumbar region: Secondary | ICD-10-CM | POA: Diagnosis not present

## 2019-07-12 ENCOUNTER — Encounter: Payer: Self-pay | Admitting: Emergency Medicine

## 2019-07-12 ENCOUNTER — Other Ambulatory Visit: Payer: Self-pay

## 2019-07-12 ENCOUNTER — Ambulatory Visit (INDEPENDENT_AMBULATORY_CARE_PROVIDER_SITE_OTHER): Payer: BC Managed Care – PPO | Admitting: Emergency Medicine

## 2019-07-12 VITALS — BP 116/72 | HR 75 | Temp 98.0°F | Resp 16 | Ht 64.0 in | Wt 189.0 lb

## 2019-07-12 DIAGNOSIS — F418 Other specified anxiety disorders: Secondary | ICD-10-CM

## 2019-07-12 DIAGNOSIS — E1165 Type 2 diabetes mellitus with hyperglycemia: Secondary | ICD-10-CM | POA: Diagnosis not present

## 2019-07-12 LAB — POCT GLYCOSYLATED HEMOGLOBIN (HGB A1C): Hemoglobin A1C: 7.8 % — AB (ref 4.0–5.6)

## 2019-07-12 LAB — GLUCOSE, POCT (MANUAL RESULT ENTRY): POC Glucose: 133 mg/dl — AB (ref 70–99)

## 2019-07-12 MED ORDER — TRULICITY 0.75 MG/0.5ML ~~LOC~~ SOAJ: 0.7500 mg | SUBCUTANEOUS | 5 refills | Status: DC

## 2019-07-12 MED ORDER — GLUCOSE BLOOD VI STRP: ORAL_STRIP | 12 refills | Status: DC

## 2019-07-12 MED ORDER — CITALOPRAM HYDROBROMIDE 40 MG PO TABS: 40.0000 mg | ORAL_TABLET | Freq: Every day | ORAL | 3 refills | Status: DC

## 2019-07-12 MED ORDER — LISINOPRIL 5 MG PO TABS: 5.0000 mg | ORAL_TABLET | Freq: Every day | ORAL | 3 refills | Status: DC

## 2019-07-12 NOTE — Progress Notes (Signed)
Isaac Hall 49 y.o.   Chief Complaint  Patient presents with  . Diabetes    X 3 MONTHS  . Medication Refill    Lisinopril and diabetic test strips    HISTORY OF PRESENT ILLNESS: This is a 49 y.o. male with history of diabetes here for follow-up. Presently taking Metformin 500 mg twice a day. Takes atorvastatin 40 mg daily. Presently taking Celexa 20 mg daily and inquiring about increasing the dose. Depression screen [facility] 2/9 07/12/2019 03/15/2019 09/15/2018 05/22/2018 08/19/2017  Decreased Interest 2 0 0 0 0  Down, Depressed, Hopeless 2 0 0 - 0  PHQ - 2 Score 4 0 0 0 0  Altered sleeping 0 - - - -  Tired, decreased energy 3 - - - -  Change in appetite 3 - - - -  Feeling bad or failure about yourself  2 - - - -  Trouble concentrating 2 - - - -  Moving slowly or fidgety/restless 2 - - - -  Suicidal thoughts 2 - - - -  PHQ-9 Score 18 - - - -  Difficult doing work/chores Somewhat difficult - - - -   No other complaints or medical concerns today.  HPI   Prior to Admission medications   Medication Sig Start Date End Date Taking? Authorizing Provider  atorvastatin (LIPITOR) 40 MG tablet Take 1 tablet (40 mg total) by mouth daily. 03/16/19  Yes , Eilleen Kempf, MD  lisinopril (ZESTRIL) 5 MG tablet Take 1 tablet (5 mg total) by mouth daily. 07/12/19  Yes , Eilleen Kempf, MD  metFORMIN (GLUCOPHAGE) 500 MG tablet Take 1 tablet (500 mg total) by mouth 2 (two) times daily with a meal. 03/15/19  Yes , Eilleen Kempf, MD  tobramycin (TOBREX) 0.3 % ophthalmic solution Place 1 drop into the left eye 2 (two) times daily as needed (stye).    Yes [provider]  citalopram (CELEXA) 20 MG tablet Take 1 tablet (20 mg total) by mouth daily. 09/15/18 12/28/18  Georgina Quint, MD  cyclobenzaprine (FLEXERIL) 10 MG tablet Take 1 tablet (10 mg total) by mouth 2 (two) times daily as needed. Patient not taking: Reported on 03/15/2019 11/16/18   Michela Pitcher A, PA-C    diclofenac (VOLTAREN) 75 MG EC tablet Take 75 mg by mouth 2 (two) times daily.    [provider]  Dulaglutide (TRULICITY) 0.75 MG/0.5ML SOPN Inject 0.75 mg into the skin once a week. 07/12/19   Georgina Quint, MD  gabapentin (NEURONTIN) 300 MG capsule Take 300 mg by mouth 2 (two) times daily. 09/23/18   [provider]  glucose blood test strip Use as instructed 07/12/19   Georgina Quint, MD  lidocaine (LIDODERM) 5 % Place 1 patch onto the skin daily. Remove & Discard patch within 12 hours or as directed by MD Patient not taking: Reported on 03/15/2019 11/16/18   Michela Pitcher A, PA-C  meloxicam (MOBIC) 15 MG tablet TAKE 1 TABLET BY MOUTH EVERY DAY Patient not taking: Reported on 03/15/2019 10/08/18   Georgina Quint, MD  methocarbamol (ROBAXIN) 500 MG tablet Take 500 mg by mouth 2 (two) times daily.    [provider]  methocarbamol (ROBAXIN) 500 MG tablet Take 1 tablet (500 mg total) by mouth every 6 (six) hours as needed for muscle spasms. Patient not taking: Reported on 03/15/2019 01/06/19   Barnett Abu, MD  [facility] DELICA LANCETS 33G MISC  08/01/17   [provider]  oxyCODONE-acetaminophen (PERCOCET/ROXICET) 5-325 MG tablet  Take 1-2 tablets by mouth every 4 (four) hours as needed for moderate pain or severe pain. Patient not taking: Reported on 03/15/2019 01/06/19   Barnett Abu, MD  pregabalin (LYRICA) 75 MG capsule Take 75 mg by mouth 3 (three) times daily.    [provider]    No Known Allergies  Patient Active Problem List   Diagnosis Date Noted  . Herniated nucleus pulposus, L5-S1, left 01/05/2019  . Type 2 diabetes mellitus with hyperglycemia, without long-term current use of insulin (HCC) 07/04/2014    Past Medical History:  Diagnosis Date  . Anxiety   . Arthritis   . Back pain   . Diabetes mellitus without complication (HCC)   . Hypertension   . Lower extremity pain 08/2018    Past Surgical History:   Procedure Laterality Date  . ABDOMINAL EXPOSURE N/A 01/05/2019   Procedure: ABDOMINAL EXPOSURE;  Surgeon: Chuck Hint, MD;  Location: [facility] OR;  Service: Vascular;  Laterality: N/A;  . ANTERIOR LUMBAR FUSION N/A 01/05/2019   Procedure: Lumbar five Sacral one Anterior lumbar interbody fusion;  Surgeon: Barnett Abu, MD;  Location: [facility] OR;  Service: Neurosurgery;  Laterality: N/A;  . VASECTOMY      Social History   Socioeconomic History  . Marital status: Married    Spouse name: Mary  . Number of children: 2  . Years of education: 12th grade  . Highest education level: Not on file  Occupational History  . Occupation: MACHINE OPERATOR    Employer: [employer]  Tobacco Use  . Smoking status: Never Smoker  . Smokeless tobacco: Never Used  Substance and Sexual Activity  . Alcohol use: No  . Drug use: No  . Sexual activity: Not on file  Other Topics Concern  . Not on file  Social History Narrative   Lives with his wife and their 2 children.   Originally from [place].  Came to the [place] in 1995.   Social Determinants of Health   Financial Resource Strain:   . Difficulty of Paying Living Expenses:   Food Insecurity:   . Worried About Programme researcher, broadcasting/film/video in the Last Year:   . Barista in the Last Year:   Transportation Needs:   . Freight forwarder (Medical):   Marland Kitchen Lack of Transportation (Non-Medical):   Physical Activity:   . Days of Exercise per Week:   . Minutes of Exercise per Session:   Stress:   . Feeling of Stress :   Social Connections:   . Frequency of Communication with Friends and Family:   . Frequency of Social Gatherings with Friends and Family:   . Attends Religious Services:   . Active Member of Clubs or Organizations:   . Attends Banker Meetings:   Marland Kitchen Marital Status:   Intimate Partner Violence:   . Fear of Current or Ex-Partner:   . Emotionally Abused:   Marland Kitchen Physically Abused:   . Sexually Abused:     Family History   Problem Relation Age of Onset  . Cancer Mother   . Diabetes Mother   . Diabetes Father   . Diabetes Sister   . Diabetes Sister   . Diabetes Sister   . Diabetes Sister   . Hypertension Sister      Review of Systems  Constitutional: Negative.  Negative for chills, fever and weight loss.  HENT: Negative.  Negative for congestion and sore throat.   Eyes: Negative.  Negative for blurred vision and  double vision.       Chalazion gone  Respiratory: Negative.  Negative for cough and shortness of breath.   Cardiovascular: Negative.  Negative for chest pain and palpitations.  Gastrointestinal: Negative.  Negative for abdominal pain, blood in stool, diarrhea, melena, nausea and vomiting.  Genitourinary: Negative.  Negative for dysuria and hematuria.  Musculoskeletal: Negative.  Negative for back pain (Much improved lumbar pain since surgery last year), myalgias and neck pain.  Skin: Negative.  Negative for rash.  Neurological: Negative for dizziness and headaches.  Endo/Heme/Allergies: Negative.   All other systems reviewed and are negative.   Vitals:   07/12/19 1144  BP: 116/72  Pulse: 75  Resp: 16  Temp: 98 F (36.7 C)  SpO2: 98%   Wt Readings from Last 3 Encounters:  07/12/19 189 lb (85.7 kg)  04/21/19 190 lb (86.2 kg)  03/15/19 193 lb (87.5 kg)    Physical Exam  Results for orders placed or performed in visit on 07/12/19 (from the past 24 hour(s))  POCT glucose (manual entry)     Status: Abnormal   Collection Time: 07/12/19 11:53 AM  Result Value Ref Range   POC Glucose 133 (A) 70 - 99 mg/dl  POCT glycosylated hemoglobin (Hb A1C)     Status: Abnormal   Collection Time: 07/12/19 12:00 PM  Result Value Ref Range   Hemoglobin A1C 7.8 (A) 4.0 - 5.6 %   HbA1c POC (<> result, manual entry)     HbA1c, POC (prediabetic range)     HbA1c, POC (controlled diabetic range)      ASSESSMENT & PLAN: Type 2 diabetes mellitus with hyperglycemia, without long-term current use of  insulin (HCC) Uncontrolled diabetes with hemoglobin A1c at 7.8 higher than before.  Continue metformin 500 mg twice a day and will start weekly Trulicity 0.75 mg.  Diet and nutrition discussed.  Continue statin therapy with atorvastatin 40 mg daily. Follow-up in 3 months.  Depression with anxiety High screening score for depression.  Patient has been taking Celexa 20 mg daily.  Recommended to increase to 40 mg daily.  Manasseh was seen today for diabetes and medication refill.  Diagnoses and all orders for this visit:  Type 2 diabetes mellitus with hyperglycemia, without long-term current use of insulin (HCC) -     CBC with Differential/Platelet -     Comprehensive metabolic panel -     Lipid panel -     POCT glucose (manual entry) -     POCT glycosylated hemoglobin (Hb A1C) -     lisinopril (ZESTRIL) 5 MG tablet; Take 1 tablet (5 mg total) by mouth daily. -     glucose blood test strip; Use as instructed -     Dulaglutide (TRULICITY) 0.75 MG/0.5ML SOPN; Inject 0.75 mg into the skin once a week.  Depression with anxiety -     citalopram (CELEXA) 40 MG tablet; Take 1 tablet (40 mg total) by mouth daily.    Patient Instructions       If you have lab work done today you will be contacted with your lab results within the next 2 weeks.  If you have not heard from us then please contact us. The fastest way to get your results is to register for My Chart.   IF you received an x-ray today, you will receive an invoice from [facility]. Please contact [facility] at [phone] with questions or concerns regarding your invoice.   IF you received labwork today, you will receive  an Economist from [employer]. Please contact [facility] at [phone] with questions or concerns regarding your invoice.   Our billing staff will not be able to assist you with questions regarding bills from these companies.  You will be contacted with the lab results as soon as they are available.  The fastest way to get your results is to activate your My Chart account. Instructions are located on the last page of this paperwork. If you have not heard from [place] regarding the results in 2 weeks, please contact this office.     Diabetes mellitus y nutricin, en adultos Diabetes Mellitus and Nutrition, Adult Si sufre de diabetes (diabetes mellitus), es muy importante tener hbitos alimenticios saludables debido a que sus niveles de Psychologist, counselling sangre (glucosa) se ven afectados en gran medida por lo que come y bebe. [place] alimentos saludables en las cantidades [place], aproximadamente a la [employer], [place] ayudar a:  Scientist, physiological glucemia.  Disminuir el riesgo de sufrir una enfermedad cardaca.  Mejorar la presin arterial.  Barista o mantener un peso saludable. Todas las personas que sufren de diabetes son diferentes y cada una tiene necesidades diferentes en cuanto a un plan de alimentacin. El mdico puede recomendarle que trabaje con un especialista en dietas y nutricin (nutricionista) para Tax adviser plan para usted. Su plan de alimentacin puede variar segn factores como:  Las caloras que necesita.  Los medicamentos que toma.  Su peso.  Sus niveles de glucemia, presin arterial y colesterol.  Su nivel de [place].  Otras afecciones que tenga, como enfermedades cardacas o renales. Cmo me afectan los carbohidratos? Los carbohidratos, o hidratos de carbono, afectan su nivel de glucemia ms que cualquier otro tipo de alimento. La ingesta de carbohidratos naturalmente aumenta la cantidad de [employer]. El recuento de carbohidratos es un mtodo destinado a Midwife un registro de la cantidad de carbohidratos que se consumen. El recuento de carbohidratos es importante para Pharmacologist la glucemia a un nivel saludable, especialmente si utiliza insulina o toma determinados medicamentos por va oral para la diabetes. Es importante conocer la cantidad de  carbohidratos que se pueden ingerir en cada comida sin correr Surveyor, minerals. Esto es Government social research officer. Su nutricionista puede ayudarlo a calcular la cantidad de carbohidratos que debe ingerir en cada comida y en cada refrigerio. Entre los alimentos que contienen carbohidratos, se incluyen:  Pan, cereal, arroz, pastas y galletas.  Papas y maz.  Guisantes, frijoles y lentejas.  Leche y Dentist.  Nils Pyle y [place].  Postres, como pasteles, galletas, helado y caramelos. Cmo me afecta el alcohol? El alcohol puede provocar disminuciones sbitas de la glucemia (hipoglucemia), especialmente si utiliza insulina o toma determinados medicamentos por va oral para la diabetes. La hipoglucemia es una afeccin potencialmente mortal. Los sntomas de la hipoglucemia (somnolencia, mareos y confusin) son similares a los sntomas de haber consumido demasiado alcohol. Si el mdico afirma que el alcohol es seguro para usted, [place] estas pautas:  Limite el consumo de alcohol a no ms de por da si es mujer y no est [place], y a si es hombre. Una medida equivale a 12oz ( ) de cerveza, 5oz ( ) de vino o 1oz (27ml) de bebidas alcohlicas de alta graduacin.  No beba con el estmago vaco.  Mantngase hidratado bebiendo agua, refrescos dietticos o t helado sin azcar.  Tenga en cuenta que los refrescos comunes, los jugos y otras bebida para Engineer, manufacturing pueden contener Product/process development scientist y se  deben contar como carbohidratos. Cules son algunos consejos para seguir este plan?  Leer las etiquetas de los alimentos  Comience por leer el tamao de la porcin en la "Informacin nutricional" en las etiquetas de los alimentos envasados y las bebidas. La cantidad de caloras, carbohidratos, grasas y otros nutrientes mencionados en la etiqueta se basan en una porcin del alimento. Muchos alimentos contienen ms de una porcin por envase.  Verifique la cantidad total de gramos (g) de  carbohidratos totales en una porcin. Puede calcular la cantidad de porciones de carbohidratos al dividir el total de carbohidratos por 15. Por ejemplo, si un alimento tiene un total de 30g de carbohidratos, equivale a 2 porciones de carbohidratos.  Verifique la cantidad de gramos (g) de grasas saturadas y grasas trans en una porcin. Escoja alimentos que no contengan grasa o que tengan un bajo contenido.  Verifique la cantidad de miligramos (mg) de sal (sodio) en una porcin. [place] [employer] de las personas deben limitar la ingesta de sodio total a menos de 2300mg  por Training and development officer.  Siempre consulte la informacin nutricional de los alimentos etiquetados como "con bajo contenido de grasa" o "sin grasa". Estos alimentos pueden tener un mayor contenido de Location manager agregada o carbohidratos refinados, y deben evitarse.  Hable con su nutricionista para identificar sus objetivos diarios en cuanto a los nutrientes mencionados en la etiqueta. Al ir de compras  Evite comprar alimentos procesados, enlatados o precocinados. Estos alimentos tienden a Special educational needs teacher mayor cantidad de [place], sodio y azcar agregada.  Compre en la zona exterior de la tienda de comestibles. Esta zona incluye frutas y verduras frescas, granos a granel, carnes frescas y productos lcteos frescos. Al cocinar  Utilice mtodos de coccin a baja temperatura, como hornear, en lugar de mtodos de coccin a alta temperatura, como frer en abundante aceite.  Cocine con aceites saludables, como el aceite de [place], canola o [place].  Evite cocinar con manteca, crema o carnes con alto contenido de grasa. Planificacin de las comidas  Coma las comidas y los refrigerios regularmente, preferentemente a la misma hora todos [place]. Evite pasar largos perodos de tiempo sin comer.  Consuma alimentos ricos en fibra, como frutas frescas, verduras, frijoles y cereales integrales. Consulte a su nutricionista sobre cuntas porciones de carbohidratos puede  consumir en cada comida.  Consuma entre 4 y 6 onzas (oz) de protenas magras por da, como carnes [place], pollo, pescado, huevos o tofu. Una onza de protena magra equivale a: ? 1 onza de carne, pollo o pescado. ? 1huevo. ?  taza de tofu.  Coma algunos alimentos por da que contengan grasas saludables, como aguacates, frutos secos, semillas y pescado. Estilo de vida  Controle su nivel de glucemia con regularidad.  Haga actividad fsica habitualmente como se lo haya indicado el mdico. Esto puede incluir lo siguiente: ? 160minutos semanales de ejercicio de intensidad moderada o alta. Esto podra incluir caminatas dinmicas, ciclismo o gimnasia acutica. ? Realizar ejercicios de elongacin y de fortalecimiento, como yoga o levantamiento de pesas, por lo menos 2veces por semana.  Tome los [employer] se lo haya indicado el mdico.  No consuma ningn producto que contenga nicotina o tabaco, como cigarrillos y Psychologist, sport and exercise. Si necesita ayuda para dejar de fumar, consulte al [employer] con un asesor o instructor en diabetes para identificar estrategias para controlar el estrs y cualquier desafo emocional y social. Preguntas para hacerle al mdico  Es necesario que consulte a Radio broadcast assistant en el cuidado de la diabetes?  Es necesario que me rena con un nutricionista?  A qu nmero puedo llamar si tengo preguntas?  Cules son los mejores momentos para controlar la glucemia? Dnde encontrar ms informacin:  Asociacin Est[place]ounidense de la Diabetes (American Diabetes Association): diabetes.org  Academia de Nutricin y Pension scheme manager (Academy of Nutrition and Dietetics): www.eatright.org  [employer] de la Diabetes y las Enfermedades Digestivas y [place] [facility] of Diabetes and Digestive and Kidney Diseases, NIH): CarFlippers.tn Resumen  Un plan de alimentacin saludable lo ayudar a Scientist, physiological glucemia y Pharmacologist un estilo de vida  saludable.  Trabajar con un especialista en dietas y nutricin (nutricionista) puede ayudarlo a Designer, television/film set de alimentacin para usted.  Tenga en cuenta que los carbohidratos (hidratos de carbono) y el alcohol tienen efectos inmediatos en sus niveles de glucemia. Es importante contar los carbohidratos que ingiere y consumir alcohol con prudencia. Esta informacin no tiene Theme park manager el consejo del mdico. Asegrese de hacerle al mdico cualquier pregunta que tenga. Document Revised: 11/12/2016 Document Reviewed: 06/24/2016 Elsevier Patient Education  2020 Elsevier Inc.      Edwina Barth, MD [facility]

## 2019-07-12 NOTE — Patient Instructions (Addendum)
   If you have lab work done today you will be contacted with your lab results within the next 2 weeks.  If you have not heard from us then please contact us. The fastest way to get your results is to register for My Chart.   IF you received an x-ray today, you will receive an invoice from [facility]. Please contact [facility] at [phone] with questions or concerns regarding your invoice.   IF you received labwork today, you will receive an invoice from [facility]. Please contact [facility] at [phone] with questions or concerns regarding your invoice.   Our billing staff will not be able to assist you with questions regarding bills from these companies.  You will be contacted with the lab results as soon as they are available. The fastest way to get your results is to activate your My Chart account. Instructions are located on the last page of this paperwork. If you have not heard from us regarding the results in 2 weeks, please contact this office.     Diabetes mellitus y nutricin, en adultos Diabetes Mellitus and Nutrition, Adult Si sufre de diabetes (diabetes mellitus), es muy importante tener hbitos alimenticios saludables debido a que sus niveles de azcar en la sangre (glucosa) se ven afectados en gran medida por lo que come y bebe. Comer alimentos saludables en las cantidades adecuadas, aproximadamente a la misma hora todos los das, lo ayudar a:  Controlar la glucemia.  Disminuir el riesgo de sufrir una enfermedad cardaca.  Mejorar la presin arterial.  Alcanzar o mantener un peso saludable. Todas las personas que sufren de diabetes son diferentes y cada una tiene necesidades diferentes en cuanto a un plan de alimentacin. El mdico puede recomendarle que trabaje con un especialista en dietas y nutricin (nutricionista) para elaborar el mejor plan para usted. Su plan de alimentacin puede variar segn factores como:  Las caloras que  necesita.  Los medicamentos que toma.  Su peso.  Sus niveles de glucemia, presin arterial y colesterol.  Su nivel de actividad.  Otras afecciones que tenga, como enfermedades cardacas o renales. Cmo me afectan los carbohidratos? Los carbohidratos, o hidratos de carbono, afectan su nivel de glucemia ms que cualquier otro tipo de alimento. La ingesta de carbohidratos naturalmente aumenta la cantidad de glucosa en la sangre. El recuento de carbohidratos es un mtodo destinado a llevar un registro de la cantidad de carbohidratos que se consumen. El recuento de carbohidratos es importante para mantener la glucemia a un nivel saludable, especialmente si utiliza insulina o toma determinados medicamentos por va oral para la diabetes. Es importante conocer la cantidad de carbohidratos que se pueden ingerir en cada comida sin correr ningn riesgo. Esto es diferente en cada persona. Su nutricionista puede ayudarlo a calcular la cantidad de carbohidratos que debe ingerir en cada comida y en cada refrigerio. Entre los alimentos que contienen carbohidratos, se incluyen:  Pan, cereal, arroz, pastas y galletas.  Papas y maz.  Guisantes, frijoles y lentejas.  Leche y yogur.  Frutas y jugo.  Postres, como pasteles, galletas, helado y caramelos. Cmo me afecta el alcohol? El alcohol puede provocar disminuciones sbitas de la glucemia (hipoglucemia), especialmente si utiliza insulina o toma determinados medicamentos por va oral para la diabetes. La hipoglucemia es una afeccin potencialmente mortal. Los sntomas de la hipoglucemia (somnolencia, mareos y confusin) son similares a los sntomas de haber consumido demasiado alcohol. Si el mdico afirma que el alcohol es seguro para usted, siga estas pautas:    Limite el consumo de alcohol a no ms de 1medida por da si es mujer y no est embarazada, y a 2medidas si es hombre. Una medida equivale a 12oz (355ml) de cerveza, 5oz (148ml) de vino o  1oz (44ml) de bebidas alcohlicas de alta graduacin.  No beba con el estmago vaco.  Mantngase hidratado bebiendo agua, refrescos dietticos o t helado sin azcar.  Tenga en cuenta que los refrescos comunes, los jugos y otras bebida para mezclar pueden contener mucha azcar y se deben contar como carbohidratos. Cules son algunos consejos para seguir este plan?  Leer las etiquetas de los alimentos  Comience por leer el tamao de la porcin en la "Informacin nutricional" en las etiquetas de los alimentos envasados y las bebidas. La cantidad de caloras, carbohidratos, grasas y otros nutrientes mencionados en la etiqueta se basan en una porcin del alimento. Muchos alimentos contienen ms de una porcin por envase.  Verifique la cantidad total de gramos (g) de carbohidratos totales en una porcin. Puede calcular la cantidad de porciones de carbohidratos al dividir el total de carbohidratos por 15. Por ejemplo, si un alimento tiene un total de 30g de carbohidratos, equivale a 2 porciones de carbohidratos.  Verifique la cantidad de gramos (g) de grasas saturadas y grasas trans en una porcin. Escoja alimentos que no contengan grasa o que tengan un bajo contenido.  Verifique la cantidad de miligramos (mg) de sal (sodio) en una porcin. La mayora de las personas deben limitar la ingesta de sodio total a menos de 2300mg por da.  Siempre consulte la informacin nutricional de los alimentos etiquetados como "con bajo contenido de grasa" o "sin grasa". Estos alimentos pueden tener un mayor contenido de azcar agregada o carbohidratos refinados, y deben evitarse.  Hable con su nutricionista para identificar sus objetivos diarios en cuanto a los nutrientes mencionados en la etiqueta. Al ir de compras  Evite comprar alimentos procesados, enlatados o precocinados. Estos alimentos tienden a tener una mayor cantidad de grasa, sodio y azcar agregada.  Compre en la zona exterior de la tienda de  comestibles. Esta zona incluye frutas y verduras frescas, granos a granel, carnes frescas y productos lcteos frescos. Al cocinar  Utilice mtodos de coccin a baja temperatura, como hornear, en lugar de mtodos de coccin a alta temperatura, como frer en abundante aceite.  Cocine con aceites saludables, como el aceite de oliva, canola o girasol.  Evite cocinar con manteca, crema o carnes con alto contenido de grasa. Planificacin de las comidas  Coma las comidas y los refrigerios regularmente, preferentemente a la misma hora todos los das. Evite pasar largos perodos de tiempo sin comer.  Consuma alimentos ricos en fibra, como frutas frescas, verduras, frijoles y cereales integrales. Consulte a su nutricionista sobre cuntas porciones de carbohidratos puede consumir en cada comida.  Consuma entre 4 y 6 onzas (oz) de protenas magras por da, como carnes magras, pollo, pescado, huevos o tofu. Una onza de protena magra equivale a: ? 1 onza de carne, pollo o pescado. ? 1huevo. ?  taza de tofu.  Coma algunos alimentos por da que contengan grasas saludables, como aguacates, frutos secos, semillas y pescado. Estilo de vida  Controle su nivel de glucemia con regularidad.  Haga actividad fsica habitualmente como se lo haya indicado el mdico. Esto puede incluir lo siguiente: ? 150minutos semanales de ejercicio de intensidad moderada o alta. Esto podra incluir caminatas dinmicas, ciclismo o gimnasia acutica. ? Realizar ejercicios de elongacin y de fortalecimiento, como yoga o levantamiento   de pesas, por lo menos 2veces por semana.  Tome los medicamentos como se lo haya indicado el mdico.  No consuma ningn producto que contenga nicotina o tabaco, como cigarrillos y cigarrillos electrnicos. Si necesita ayuda para dejar de fumar, consulte al mdico.  Trabaje con un asesor o instructor en diabetes para identificar estrategias para controlar el estrs y cualquier desafo emocional  y social. Preguntas para hacerle al mdico  Es necesario que consulte a un instructor en el cuidado de la diabetes?  Es necesario que me rena con un nutricionista?  A qu nmero puedo llamar si tengo preguntas?  Cules son los mejores momentos para controlar la glucemia? Dnde encontrar ms informacin:  Asociacin Estadounidense de la Diabetes (American Diabetes Association): diabetes.org  Academia de Nutricin y Diettica (Academy of Nutrition and Dietetics): www.eatright.org  Instituto Nacional de la Diabetes y las Enfermedades Digestivas y Renales (National Institute of Diabetes and Digestive and Kidney Diseases, NIH): www.niddk.nih.gov Resumen  Un plan de alimentacin saludable lo ayudar a controlar la glucemia y mantener un estilo de vida saludable.  Trabajar con un especialista en dietas y nutricin (nutricionista) puede ayudarlo a elaborar el mejor plan de alimentacin para usted.  Tenga en cuenta que los carbohidratos (hidratos de carbono) y el alcohol tienen efectos inmediatos en sus niveles de glucemia. Es importante contar los carbohidratos que ingiere y consumir alcohol con prudencia. Esta informacin no tiene como fin reemplazar el consejo del mdico. Asegrese de hacerle al mdico cualquier pregunta que tenga. Document Revised: 11/12/2016 Document Reviewed: 06/24/2016 Elsevier Patient Education  2020 Elsevier Inc.  

## 2019-07-12 NOTE — Assessment & Plan Note (Signed)
High screening score for depression.  Patient has been taking Celexa 20 mg daily.  Recommended to increase to 40 mg daily.

## 2019-07-12 NOTE — Assessment & Plan Note (Signed)
Uncontrolled diabetes with hemoglobin A1c at 7.8 higher than before.  Continue metformin 500 mg twice a day and will start weekly Trulicity 0.75 mg.  Diet and nutrition discussed.  Continue statin therapy with atorvastatin 40 mg daily. Follow-up in 3 months.

## 2019-07-13 ENCOUNTER — Other Ambulatory Visit: Payer: Self-pay

## 2019-07-13 ENCOUNTER — Encounter: Payer: Self-pay | Admitting: Registered Nurse

## 2019-07-13 ENCOUNTER — Encounter: Payer: Self-pay | Admitting: Emergency Medicine

## 2019-07-13 ENCOUNTER — Ambulatory Visit (INDEPENDENT_AMBULATORY_CARE_PROVIDER_SITE_OTHER): Payer: BC Managed Care – PPO | Admitting: Registered Nurse

## 2019-07-13 VITALS — BP 122/80 | HR 70 | Temp 97.9°F | Resp 16 | Ht 64.0 in | Wt 189.4 lb

## 2019-07-13 DIAGNOSIS — H6123 Impacted cerumen, bilateral: Secondary | ICD-10-CM

## 2019-07-13 LAB — COMPREHENSIVE METABOLIC PANEL
ALT: 33 IU/L (ref 0–44)
AST: 22 IU/L (ref 0–40)
Albumin/Globulin Ratio: 2.1 (ref 1.2–2.2)
Albumin: 4.7 g/dL (ref 4.0–5.0)
Alkaline Phosphatase: 115 IU/L (ref 39–117)
BUN/Creatinine Ratio: 14 (ref 9–20)
BUN: 12 mg/dL (ref 6–24)
Bilirubin Total: 0.4 mg/dL (ref 0.0–1.2)
CO2: 23 mmol/L (ref 20–29)
Calcium: 9.7 mg/dL (ref 8.7–10.2)
Chloride: 102 mmol/L (ref 96–106)
Creatinine, Ser: 0.87 mg/dL (ref 0.76–1.27)
GFR calc Af Amer: 117 mL/min/{1.73_m2} (ref >59)
GFR calc non Af Amer: 101 mL/min/{1.73_m2} (ref >59)
Globulin, Total: 2.2 g/dL (ref 1.5–4.5)
Glucose: 142 mg/dL — ABNORMAL HIGH (ref 65–99)
Potassium: 4.7 mmol/L (ref 3.5–5.2)
Sodium: 140 mmol/L (ref 134–144)
Total Protein: 6.9 g/dL (ref 6.0–8.5)

## 2019-07-13 LAB — LIPID PANEL
Chol/HDL Ratio: 2.9 ratio (ref 0.0–5.0)
Cholesterol, Total: 106 mg/dL (ref 100–199)
HDL: 37 mg/dL — ABNORMAL LOW (ref >39)
LDL Chol Calc (NIH): 47 mg/dL (ref 0–99)
Triglycerides: 125 mg/dL (ref 0–149)
VLDL Cholesterol Cal: 22 mg/dL (ref 5–40)

## 2019-07-13 LAB — CBC WITH DIFFERENTIAL/PLATELET
Basophils Absolute: 0 10*3/uL (ref 0.0–0.2)
Basos: 1 %
EOS (ABSOLUTE): 0.2 10*3/uL (ref 0.0–0.4)
Eos: 2 %
Hematocrit: 46.9 % (ref 37.5–51.0)
Hemoglobin: 15.8 g/dL (ref 13.0–17.7)
Immature Grans (Abs): 0 10*3/uL (ref 0.0–0.1)
Immature Granulocytes: 1 %
Lymphocytes Absolute: 2.2 10*3/uL (ref 0.7–3.1)
Lymphs: 27 %
MCH: 30 pg (ref 26.6–33.0)
MCHC: 33.7 g/dL (ref 31.5–35.7)
MCV: 89 fL (ref 79–97)
Monocytes Absolute: 0.5 10*3/uL (ref 0.1–0.9)
Monocytes: 6 %
Neutrophils Absolute: 5.2 10*3/uL (ref 1.4–7.0)
Neutrophils: 63 %
Platelets: 187 10*3/uL (ref 150–450)
RBC: 5.26 x10E6/uL (ref 4.14–5.80)
RDW: 13.6 % (ref 11.6–15.4)
WBC: 8.1 10*3/uL (ref 3.4–10.8)

## 2019-07-13 NOTE — Patient Instructions (Signed)
° ° ° °  If you have lab work done today you will be contacted with your lab results within the next 2 weeks.  If you have not heard from us then please contact us. The fastest way to get your results is to register for My Chart. ° ° °IF you received an x-ray today, you will receive an invoice from [facility]. Please contact [facility] at [phone] with questions or concerns regarding your invoice.  ° °IF you received labwork today, you will receive an invoice from [facility]. Please contact [facility] at [phone] with questions or concerns regarding your invoice.  ° °Our billing staff will not be able to assist you with questions regarding bills from these companies. ° °You will be contacted with the lab results as soon as they are available. The fastest way to get your results is to activate your My Chart account. Instructions are located on the last page of this paperwork. If you have not heard from us regarding the results in 2 weeks, please contact this office. °  ° ° ° °

## 2019-07-13 NOTE — Progress Notes (Signed)
Established Patient Office Visit  Subjective:  Patient ID: Isaac Hall, male    DOB: 1970/12/02  Age: 49 y.o. MRN: [id number]  CC:  Chief Complaint  Patient presents with  . Cerumen Impaction    patient states his ear has been pack for about three weeks sometimes its had to hear. PHQ9=12    HPI [name] presents for bilateral impacted cerumen Ongoing for a few weeks Some ringing, dullness, pressure in ears No pain or drainage No signs of infection No recent URI  This has happened before, pt has had good effect from lavage.  Past Medical History:  Diagnosis Date  . Anxiety   . Arthritis   . Back pain   . Diabetes mellitus without complication (HCC)   . Hypertension   . Lower extremity pain 08/2018    Past Surgical History:  Procedure Laterality Date  . ABDOMINAL EXPOSURE N/A 01/05/2019   Procedure: ABDOMINAL EXPOSURE;  Surgeon: Chuck Hint, MD;  [facility]: [facility] [place] OR;  Service: Vascular;  Laterality: N/A;  . ANTERIOR LUMBAR FUSION N/A 01/05/2019   Procedure: Lumbar five Sacral one Anterior lumbar interbody fusion;  Surgeon: Barnett Abu, MD;  Location: [facility] OR;  Service: Neurosurgery;  Laterality: N/A;  . VASECTOMY      Family History  Problem Relation Age of Onset  . Cancer Mother   . Diabetes Mother   . Diabetes Father   . Diabetes Sister   . Diabetes Sister   . Diabetes Sister   . Diabetes Sister   . Hypertension Sister     Social History   Socioeconomic History  . Marital status: Married    Spouse name: Mary  . Number of children: 2  . Years of education: 12th grade  . Highest education level: Not on file  Occupational History  . Occupation: MACHINE OPERATOR    Employer: [employer]  Tobacco Use  . Smoking status: Never Smoker  . Smokeless tobacco: Never Used  Substance and Sexual Activity  . Alcohol use: No  . Drug use: No  . Sexual activity: Not on file  Other Topics Concern  . Not on file  Social History  Narrative   Lives with his wife and their 2 children.   Originally from [place].  Came to the [place] in 1995.   Social Determinants of Health   Financial Resource Strain:   . Difficulty of Paying Living Expenses:   Food Insecurity:   . Worried About Programme researcher, broadcasting/film/video in the Last Year:   . Barista in the Last Year:   Transportation Needs:   . Freight forwarder (Medical):   Marland Kitchen Lack of Transportation (Non-Medical):   Physical Activity:   . Days of Exercise per Week:   . Minutes of Exercise per Session:   Stress:   . Feeling of Stress :   Social Connections:   . Frequency of Communication with Friends and Family:   . Frequency of Social Gatherings with Friends and Family:   . Attends Religious Services:   . Active Member of Clubs or Organizations:   . Attends Banker Meetings:   Marland Kitchen Marital Status:   Intimate Partner Violence:   . Fear of Current or Ex-Partner:   . Emotionally Abused:   Marland Kitchen Physically Abused:   . Sexually Abused:     Outpatient Medications Prior to Visit  Medication Sig Dispense Refill  . atorvastatin (LIPITOR) 40 MG tablet Take 1 tablet (40 mg total) by mouth daily.  90 tablet 3  . citalopram (CELEXA) 40 MG tablet Take 1 tablet (40 mg total) by mouth daily. 90 tablet 3  . Dulaglutide (TRULICITY) 6.73 AL/9.3XT SOPN Inject 0.75 mg into the skin once a week. 4 pen 5  . glucose blood test strip Use as instructed 100 each 12  . lisinopril (ZESTRIL) 5 MG tablet Take 1 tablet (5 mg total) by mouth daily. 90 tablet 3  . metFORMIN (GLUCOPHAGE) 500 MG tablet Take 1 tablet (500 mg total) by mouth 2 (two) times daily with a meal. 180 tablet 3  . ONETOUCH DELICA LANCETS 02I MISC      No facility-administered medications prior to visit.    No Known Allergies  ROS Review of Systems  per hpi     Objective:    Physical Exam  Constitutional: He is oriented to person, place, and time. He appears well-developed and well-nourished. No distress.   HENT:  Right Ear: Decreased hearing is noted.  Left Ear: Decreased hearing is noted.  bilat impacted cerumen  Neurological: He is alert and oriented to person, place, and time.  Skin: Skin is warm and dry. No rash noted. He is not diaphoretic. No erythema. No pallor.  Psychiatric: He has a normal mood and affect. His behavior is normal. Judgment and thought content normal.  Nursing note and vitals reviewed.   BP 122/80   Pulse 70   Temp 97.9 F (36.6 C) (Temporal)   Resp 16   Ht 5\' 4"  (1.626 m)   Wt 189 lb 6.4 oz (85.9 kg)   SpO2 95%   BMI 32.51 kg/m  Wt Readings from Last 3 Encounters:  07/13/19 189 lb 6.4 oz (85.9 kg)  07/12/19 189 lb (85.7 kg)  04/21/19 190 lb (86.2 kg)     There are no preventive care reminders to display for this patient.  There are no preventive care reminders to display for this patient.  Lab Results  Component Value Date   TSH 2.130 02/20/2017   Lab Results  Component Value Date   WBC 8.1 07/12/2019   HGB 15.8 07/12/2019   HCT 46.9 07/12/2019   MCV 89 07/12/2019   PLT 187 07/12/2019   Lab Results  Component Value Date   NA 140 07/12/2019   K 4.7 07/12/2019   CO2 23 07/12/2019   GLUCOSE 142 (H) 07/12/2019   BUN 12 07/12/2019   CREATININE 0.87 07/12/2019   BILITOT 0.4 07/12/2019   ALKPHOS 115 07/12/2019   AST 22 07/12/2019   ALT 33 07/12/2019   PROT 6.9 07/12/2019   ALBUMIN 4.7 07/12/2019   CALCIUM 9.7 07/12/2019   ANIONGAP 11 12/31/2018   Lab Results  Component Value Date   CHOL 106 07/12/2019   Lab Results  Component Value Date   HDL 37 (L) 07/12/2019   Lab Results  Component Value Date   LDLCALC 47 07/12/2019   Lab Results  Component Value Date   TRIG 125 07/12/2019   Lab Results  Component Value Date   CHOLHDL 2.9 07/12/2019   Lab Results  Component Value Date   HGBA1C 7.8 (A) 07/12/2019      Assessment & Plan:   Problem List Items Addressed This Visit    None    Visit Diagnoses    Bilateral  impacted cerumen    -  Primary      No orders of the defined types were placed in this encounter.   Follow-up: No follow-ups on file.   PLAN  Lavage  performed by Isidoro Donning, CMa.  Pt experiences immediate relief  TM normal, no impaction on repeat exam  Patient encouraged to call clinic with any questions, comments, or concerns.  Janeece Agee, NP

## 2019-09-06 ENCOUNTER — Encounter: Payer: Self-pay | Admitting: Emergency Medicine

## 2019-09-06 DIAGNOSIS — E1165 Type 2 diabetes mellitus with hyperglycemia: Secondary | ICD-10-CM

## 2019-09-06 MED ORDER — TRULICITY 0.75 MG/0.5ML ~~LOC~~ SOAJ: 0.7500 mg | SUBCUTANEOUS | 5 refills | Status: DC

## 2019-09-08 DIAGNOSIS — M5126 Other intervertebral disc displacement, lumbar region: Secondary | ICD-10-CM | POA: Diagnosis not present

## 2019-09-10 ENCOUNTER — Encounter: Payer: Self-pay | Admitting: Emergency Medicine

## 2019-10-18 ENCOUNTER — Ambulatory Visit (INDEPENDENT_AMBULATORY_CARE_PROVIDER_SITE_OTHER): Payer: BC Managed Care – PPO | Admitting: Emergency Medicine

## 2019-10-18 ENCOUNTER — Encounter: Payer: Self-pay | Admitting: Emergency Medicine

## 2019-10-18 ENCOUNTER — Other Ambulatory Visit: Payer: Self-pay

## 2019-10-18 VITALS — BP 114/69 | HR 95 | Temp 98.0°F | Ht 64.0 in | Wt 190.4 lb

## 2019-10-18 DIAGNOSIS — E1165 Type 2 diabetes mellitus with hyperglycemia: Secondary | ICD-10-CM

## 2019-10-18 DIAGNOSIS — Z6832 Body mass index (BMI) 32.0-32.9, adult: Secondary | ICD-10-CM | POA: Diagnosis not present

## 2019-10-18 DIAGNOSIS — F418 Other specified anxiety disorders: Secondary | ICD-10-CM | POA: Diagnosis not present

## 2019-10-18 LAB — GLUCOSE, POCT (MANUAL RESULT ENTRY): POC Glucose: 126 mg/dl — AB (ref 70–99)

## 2019-10-18 LAB — POCT GLYCOSYLATED HEMOGLOBIN (HGB A1C): Hemoglobin A1C: 6.7 % — AB (ref 4.0–5.6)

## 2019-10-18 MED ORDER — GLUCOSE BLOOD VI STRP: ORAL_STRIP | 12 refills | Status: DC

## 2019-10-18 MED ORDER — LISINOPRIL 5 MG PO TABS: 5.0000 mg | ORAL_TABLET | Freq: Every day | ORAL | 3 refills | Status: DC

## 2019-10-18 NOTE — Patient Instructions (Addendum)
   If you have lab work done today you will be contacted with your lab results within the next 2 weeks.  If you have not heard from us then please contact us. The fastest way to get your results is to register for My Chart.   IF you received an x-ray today, you will receive an invoice from [facility]. Please contact [facility] at [phone] with questions or concerns regarding your invoice.   IF you received labwork today, you will receive an invoice from [facility]. Please contact [facility] at [phone] with questions or concerns regarding your invoice.   Our billing staff will not be able to assist you with questions regarding bills from these companies.  You will be contacted with the lab results as soon as they are available. The fastest way to get your results is to activate your My Chart account. Instructions are located on the last page of this paperwork. If you have not heard from us regarding the results in 2 weeks, please contact this office.     Diabetes mellitus y nutricin, en adultos Diabetes Mellitus and Nutrition, Adult Si sufre de diabetes (diabetes mellitus), es muy importante tener hbitos alimenticios saludables debido a que sus niveles de azcar en la sangre (glucosa) se ven afectados en gran medida por lo que come y bebe. [place]er alimentos saludables en las cantidades adecuadas, aproximadamente a la misma hora todos los das, lo ayudar a:  Controlar la glucemia.  Disminuir el riesgo de sufrir una enfermedad cardaca.  Mejorar la presin arterial.  Alcanzar o mantener un peso saludable. Todas las personas que sufren de diabetes son diferentes y cada una tiene necesidades diferentes en cuanto a un plan de alimentacin. El mdico puede recomendarle que trabaje con un especialista en dietas y nutricin (nutricionista) para elaborar el mejor plan para usted. Su plan de alimentacin puede variar segn factores como:  Las caloras que  necesita.  Los medicamentos que toma.  Su peso.  Sus niveles de glucemia, presin arterial y colesterol.  Su nivel de actividad.  Otras afecciones que tenga, como enfermedades cardacas o renales. Cmo me afectan los carbohidratos? Los carbohidratos, o hidratos de carbono, afectan su nivel de glucemia ms que cualquier otro tipo de alimento. La ingesta de carbohidratos naturalmente aumenta la cantidad de glucosa en la sangre. El recuento de carbohidratos es un mtodo destinado a llevar un registro de la cantidad de carbohidratos que se consumen. El recuento de carbohidratos es importante para mantener la glucemia a un nivel saludable, especialmente si utiliza insulina o toma determinados medicamentos por va oral para la diabetes. Es importante conocer la cantidad de carbohidratos que se pueden ingerir en cada comida sin correr ningn riesgo. Esto es diferente en cada persona. Su nutricionista puede ayudarlo a calcular la cantidad de carbohidratos que debe ingerir en cada comida y en cada refrigerio. Entre los alimentos que contienen carbohidratos, se incluyen:  Pan, cereal, arroz, pastas y galletas.  Papas y maz.  Guisantes, frijoles y lentejas.  Leche y yogur.  Frutas y jugo.  Postres, como pasteles, galletas, helado y caramelos. Cmo me afecta el alcohol? El alcohol puede provocar disminuciones sbitas de la glucemia (hipoglucemia), especialmente si utiliza insulina o toma determinados medicamentos por va oral para la diabetes. La hipoglucemia es una afeccin potencialmente mortal. Los sntomas de la hipoglucemia (somnolencia, mareos y confusin) son similares a los sntomas de haber consumido demasiado alcohol. Si el mdico afirma que el alcohol es seguro para usted, siga estas pautas:    Limite el consumo de alcohol a no ms de 1medida por da si es mujer y no est embarazada, y a 2medidas si es hombre. Una medida equivale a 12oz (355ml) de cerveza, 5oz (148ml) de vino o  1oz (44ml) de bebidas alcohlicas de alta graduacin.  No beba con el estmago vaco.  Mantngase hidratado bebiendo agua, refrescos dietticos o t helado sin azcar.  Tenga en cuenta que los refrescos comunes, los jugos y otras bebida para mezclar pueden contener mucha azcar y se deben contar como carbohidratos. Cules son algunos consejos para seguir este plan?  Leer las etiquetas de los alimentos  Comience por leer el tamao de la porcin en la "Informacin nutricional" en las etiquetas de los alimentos envasados y las bebidas. La cantidad de caloras, carbohidratos, grasas y otros nutrientes mencionados en la etiqueta se basan en una porcin del alimento. Muchos alimentos contienen ms de una porcin por envase.  Verifique la cantidad total de gramos (g) de carbohidratos totales en una porcin. Puede calcular la cantidad de porciones de carbohidratos al dividir el total de carbohidratos por 15. Por ejemplo, si un alimento tiene un total de 30g de carbohidratos, equivale a 2 porciones de carbohidratos.  Verifique la cantidad de gramos (g) de grasas saturadas y grasas trans en una porcin. Escoja alimentos que no contengan grasa o que tengan un bajo contenido.  Verifique la cantidad de miligramos (mg) de sal (sodio) en una porcin. La mayora de las personas deben limitar la ingesta de sodio total a menos de 2300mg por da.  Siempre consulte la informacin nutricional de los alimentos etiquetados como "con bajo contenido de grasa" o "sin grasa". Estos alimentos pueden tener un mayor contenido de azcar agregada o carbohidratos refinados, y deben evitarse.  Hable con su nutricionista para identificar sus objetivos diarios en cuanto a los nutrientes mencionados en la etiqueta. Al ir de compras  Evite comprar alimentos procesados, enlatados o precocinados. Estos alimentos tienden a tener una mayor cantidad de grasa, sodio y azcar agregada.  Compre en la zona exterior de la tienda de  comestibles. Esta zona incluye frutas y verduras frescas, granos a granel, carnes frescas y productos lcteos frescos. Al cocinar  Utilice mtodos de coccin a baja temperatura, como hornear, en lugar de mtodos de coccin a alta temperatura, como frer en abundante aceite.  Cocine con aceites saludables, como el aceite de oliva, canola o girasol.  Evite cocinar con manteca, crema o carnes con alto contenido de grasa. Planificacin de las comidas  Coma las comidas y los refrigerios regularmente, preferentemente a la misma hora todos los das. Evite pasar largos perodos de tiempo sin comer.  Consuma alimentos ricos en fibra, como frutas frescas, verduras, frijoles y cereales integrales. Consulte a su nutricionista sobre cuntas porciones de carbohidratos puede consumir en cada comida.  Consuma entre 4 y 6 onzas (oz) de protenas magras por da, como carnes magras, pollo, pescado, huevos o tofu. Una onza de protena magra equivale a: ? 1 onza de carne, pollo o pescado. ? 1huevo. ?  taza de tofu.  Coma algunos alimentos por da que contengan grasas saludables, como aguacates, frutos secos, semillas y pescado. Estilo de vida  Controle su nivel de glucemia con regularidad.  Haga actividad fsica habitualmente como se lo haya indicado el mdico. Esto puede incluir lo siguiente: ? 150minutos semanales de ejercicio de intensidad moderada o alta. Esto podra incluir caminatas dinmicas, ciclismo o gimnasia acutica. ? Realizar ejercicios de elongacin y de fortalecimiento, como yoga o levantamiento   de pesas, por lo menos 2veces por semana.  Tome los medicamentos como se lo haya indicado el mdico.  No consuma ningn producto que contenga nicotina o tabaco, como cigarrillos y cigarrillos electrnicos. Si necesita ayuda para dejar de fumar, consulte al mdico.  Trabaje con un asesor o instructor en diabetes para identificar estrategias para controlar el estrs y cualquier desafo emocional  y social. Preguntas para hacerle al mdico  Es necesario que consulte a un instructor en el cuidado de la diabetes?  Es necesario que me rena con un nutricionista?  A qu nmero puedo llamar si tengo preguntas?  Cules son los mejores momentos para controlar la glucemia? Dnde encontrar ms informacin:  Asociacin Estadounidense de la Diabetes (American Diabetes Association): diabetes.org  Academia de Nutricin y Diettica (Academy of Nutrition and Dietetics): www.eatright.org  Instituto Nacional de la Diabetes y las Enfermedades Digestivas y Renales (National Institute of Diabetes and Digestive and Kidney Diseases, NIH): www.niddk.nih.gov Resumen  Un plan de alimentacin saludable lo ayudar a controlar la glucemia y mantener un estilo de vida saludable.  Trabajar con un especialista en dietas y nutricin (nutricionista) puede ayudarlo a elaborar el mejor plan de alimentacin para usted.  Tenga en cuenta que los carbohidratos (hidratos de carbono) y el alcohol tienen efectos inmediatos en sus niveles de glucemia. Es importante contar los carbohidratos que ingiere y consumir alcohol con prudencia. Esta informacin no tiene como fin reemplazar el consejo del mdico. Asegrese de hacerle al mdico cualquier pregunta que tenga. Document Revised: 11/12/2016 Document Reviewed: 06/24/2016 Elsevier Patient Education  2020 [employer].  

## 2019-10-18 NOTE — Assessment & Plan Note (Signed)
Much improved diabetes with hemoglobin A1c of 6.7.  Continue weekly Trulicity 0.75 mg.  Metformin discontinued.  Diet and nutrition discussed. Follow-up in 6 months.

## 2019-10-18 NOTE — Progress Notes (Signed)
Vander Miranda-Perez 49 y.o.   Chief Complaint  Patient presents with  . Diabetes    f/u     HISTORY OF PRESENT ILLNESS: This is a 49 y.o. male with history of diabetes presently on Trulicity 0.75 mg weekly.  Stopped Metformin 2 months ago because it was making his sugar too low.  Glucose readings at home between 115 and 130 in the mornings.  Feels much better.  Has no complaints or medical concerns today. Increased dose of Celexa to 40 mg during last visit.  Much improved depression. Taking atorvastatin 40 mg daily. History of sciatica on the left side.  Status post recent lumbar procedure.  Recently taken off Lyrica.  HPI   Prior to Admission medications   Medication Sig Start Date End Date Taking? Authorizing Provider  atorvastatin (LIPITOR) 40 MG tablet Take 1 tablet (40 mg total) by mouth daily. 03/16/19  Yes , Eilleen Kempf, MD  citalopram (CELEXA) 40 MG tablet Take 1 tablet (40 mg total) by mouth daily. 07/12/19 10/18/19 Yes , Eilleen Kempf, MD  diazepam (VALIUM) 5 MG tablet Take 5 mg by mouth every 6 (six) hours as needed. 05/29/19  Yes [provider]  Dulaglutide (TRULICITY) 0.75 MG/0.5ML SOPN Inject 0.5 mLs (0.75 mg total) into the skin once a week. 09/06/19  Yes Georgina Quint, MD  glucose blood test strip Use as instructed 10/18/19  Yes , Eilleen Kempf, MD  lisinopril (ZESTRIL) 5 MG tablet Take 1 tablet (5 mg total) by mouth daily. 10/18/19  Yes , Eilleen Kempf, MD  [facility] DELICA LANCETS 33G MISC  08/01/17  Yes [provider]  pregabalin (LYRICA) 100 MG capsule Take 100 mg by mouth 3 (three) times daily. 07/20/19  Yes [provider]    No Known Allergies  Patient Active Problem List   Diagnosis Date Noted  . Depression with anxiety 07/12/2019  . Herniated nucleus pulposus, L5-S1, left 01/05/2019  . Type 2 diabetes mellitus with hyperglycemia, without long-term current use of insulin (HCC) 07/04/2014    Past  Medical History:  Diagnosis Date  . Anxiety   . Arthritis   . Back pain   . Diabetes mellitus without complication (HCC)   . Hypertension   . Lower extremity pain 08/2018    Past Surgical History:  Procedure Laterality Date  . ABDOMINAL EXPOSURE N/A 01/05/2019   Procedure: ABDOMINAL EXPOSURE;  Surgeon: Chuck Hint, MD;  Location: [facility] OR;  Service: Vascular;  Laterality: N/A;  . ANTERIOR LUMBAR FUSION N/A 01/05/2019   Procedure: Lumbar five Sacral one Anterior lumbar interbody fusion;  Surgeon: Barnett Abu, MD;  Location: [facility] OR;  Service: Neurosurgery;  Laterality: N/A;  . VASECTOMY      Social History   Socioeconomic History  . Marital status: Married    Spouse name: Mary  . Number of children: 2  . Years of education: 12th grade  . Highest education level: Not on file  Occupational History  . Occupation: MACHINE OPERATOR    Employer: [employer]  Tobacco Use  . Smoking status: Never Smoker  . Smokeless tobacco: Never Used  Vaping Use  . Vaping Use: Never used  Substance and Sexual Activity  . Alcohol use: No  . Drug use: No  . Sexual activity: Not on file  Other Topics Concern  . Not on file  Social History Narrative   Lives with his wife and their 2 children.   Originally from [place].  Came to the [place] in 1995.  Social Determinants of Health   Financial Resource Strain:   . Difficulty of Paying Living Expenses:   Food Insecurity:   . Worried About Programme researcher, broadcasting/film/video in the Last Year:   . Barista in the Last Year:   Transportation Needs:   . Freight forwarder (Medical):   Marland Kitchen Lack of Transportation (Non-Medical):   Physical Activity:   . Days of Exercise per Week:   . Minutes of Exercise per Session:   Stress:   . Feeling of Stress :   Social Connections:   . Frequency of Communication with Friends and Family:   . Frequency of Social Gatherings with Friends and Family:   . Attends Religious Services:   . Active Member of  Clubs or Organizations:   . Attends Banker Meetings:   Marland Kitchen Marital Status:   Intimate Partner Violence:   . Fear of Current or Ex-Partner:   . Emotionally Abused:   Marland Kitchen Physically Abused:   . Sexually Abused:     Family History  Problem Relation Age of Onset  . Cancer Mother   . Diabetes Mother   . Diabetes Father   . Diabetes Sister   . Diabetes Sister   . Diabetes Sister   . Diabetes Sister   . Hypertension Sister      Review of Systems  Constitutional: Negative.  Negative for chills and fever.  HENT: Negative.  Negative for congestion and sore throat.   Respiratory: Negative.  Negative for cough and shortness of breath.   Cardiovascular: Negative.  Negative for chest pain and palpitations.  Gastrointestinal: Negative.  Negative for abdominal pain, blood in stool, diarrhea, melena, nausea and vomiting.  Genitourinary: Negative.  Negative for dysuria and hematuria.  Musculoskeletal: Negative.   Skin: Negative.  Negative for rash.  Neurological: Negative.  Negative for dizziness and headaches.  All other systems reviewed and are negative.  Today's Vitals   10/18/19 1132  BP: 114/69  Pulse: 95  Temp: 98 F (36.7 C)  TempSrc: Temporal  SpO2: 97%  Weight: 190 lb 6.4 oz (86.4 kg)  Height:  (1.626 m)   Body mass index is 32.68 kg/m.  Wt Readings from Last 3 Encounters:  10/18/19 190 lb 6.4 oz (86.4 kg)  07/13/19 189 lb 6.4 oz (85.9 kg)  07/12/19 189 lb (85.7 kg)    Physical Exam Vitals reviewed.  Constitutional:      Appearance: Normal appearance.  HENT:     Head: Normocephalic.  Eyes:     Extraocular Movements: Extraocular movements intact.     Conjunctiva/sclera: Conjunctivae normal.     Pupils: Pupils are equal, round, and reactive to light.  Cardiovascular:     Rate and Rhythm: Normal rate and regular rhythm.     Pulses: Normal pulses.     Heart sounds: Normal heart sounds.  Pulmonary:     Effort: Pulmonary effort is normal.      Breath sounds: Normal breath sounds.  Abdominal:     Palpations: Abdomen is soft.     Tenderness: There is no abdominal tenderness.  Musculoskeletal:        General: Normal range of motion.     Cervical back: Normal range of motion and neck supple.  Skin:    General: Skin is warm and dry.     Capillary Refill: Capillary refill takes less than 2 seconds.  Neurological:     General: No focal deficit present.     Mental Status:  He is alert and oriented to person, place, and time.  Psychiatric:        Mood and Affect: Mood normal.        Behavior: Behavior normal.       Results for orders placed or performed in visit on 10/18/19 (from the past 24 hour(s))  POCT glucose (manual entry)     Status: Abnormal   Collection Time: 10/18/19 12:02 PM  Result Value Ref Range   POC Glucose 126 (A) 70 - 99 mg/dl  POCT glycosylated hemoglobin (Hb A1C)     Status: Abnormal   Collection Time: 10/18/19 12:07 PM  Result Value Ref Range   Hemoglobin A1C 6.7 (A) 4.0 - 5.6 %   HbA1c POC (<> result, manual entry)     HbA1c, POC (prediabetic range)     HbA1c, POC (controlled diabetic range)     A total of 30 minutes was spent with the patient, greater than 50% of which was in counseling/coordination of care regarding diabetes, treatment and management, cardiovascular risks associated with it, diet and nutrition, review of all medications, review of most recent blood work results including today's hemoglobin A1c, review of most recent office visit notes, prognosis and need for follow-up.  ASSESSMENT & PLAN: Type 2 diabetes mellitus with hyperglycemia, without long-term current use of insulin (HCC) Much improved diabetes with hemoglobin A1c of 6.7.  Continue weekly Trulicity 0.75 mg.  Metformin discontinued.  Diet and nutrition discussed. Follow-up in 6 months.  Depression with anxiety Much improved.  Continue Celexa 40 mg daily. Markel was seen today for diabetes.  Diagnoses and all orders for this  visit:  Type 2 diabetes mellitus with hyperglycemia, without long-term current use of insulin (HCC) -     Comprehensive metabolic panel -     Lipid panel -     POCT glucose (manual entry) -     POCT glycosylated hemoglobin (Hb A1C) -     glucose blood test strip; Use as instructed -     lisinopril (ZESTRIL) 5 MG tablet; Take 1 tablet (5 mg total) by mouth daily.  Body mass index (BMI) of 32.0-32.9 in adult  Depression with anxiety    Patient Instructions       If you have lab work done today you will be contacted with your lab results within the next 2 weeks.  If you have not heard from us then please contact us. The fastest way to get your results is to register for My Chart.   IF you received an x-ray today, you will receive an invoice from [facility]. Please contact [facility] at [phone] with questions or concerns regarding your invoice.   IF you received labwork today, you will receive an invoice from [facility]. Please contact [facility] at [phone] with questions or concerns regarding your invoice.   Our billing staff will not be able to assist you with questions regarding bills from these companies.  You will be contacted with the lab results as soon as they are available. The fastest way to get your results is to activate your My Chart account. Instructions are located on the last page of this paperwork. If you have not heard from us regarding the results in 2 weeks, please contact this office.      Diabetes mellitus y nutricin, en adultos Diabetes Mellitus and Nutrition, Adult Si sufre de diabetes (diabetes mellitus), es muy importante tener hbitos alimenticios saludables debido a que sus niveles de Bankerazcar en la sangre (glucosa) se  ven afectados en gran medida por lo que come y bebe. [place] alimentos saludables en las cantidades [place], aproximadamente a la [employer], [place] ayudar a:  Scientist, physiological glucemia.  Disminuir el  riesgo de sufrir una enfermedad cardaca.  Mejorar la presin arterial.  Barista o mantener un peso saludable. Todas las personas que sufren de diabetes son diferentes y cada una tiene necesidades diferentes en cuanto a un plan de alimentacin. El mdico puede recomendarle que trabaje con un especialista en dietas y nutricin (nutricionista) para Tax adviser plan para usted. Su plan de alimentacin puede variar segn factores como:  Las caloras que necesita.  Los medicamentos que toma.  Su peso.  Sus niveles de glucemia, presin arterial y colesterol.  Su nivel de [place].  Otras afecciones que tenga, como enfermedades cardacas o renales. Cmo me afectan los carbohidratos? Los carbohidratos, o hidratos de carbono, afectan su nivel de glucemia ms que cualquier otro tipo de alimento. La ingesta de carbohidratos naturalmente aumenta la cantidad de [employer]. El recuento de carbohidratos es un mtodo destinado a Midwife un registro de la cantidad de carbohidratos que se consumen. El recuento de carbohidratos es importante para Pharmacologist la glucemia a un nivel saludable, especialmente si utiliza insulina o toma determinados medicamentos por va oral para la diabetes. Es importante conocer la cantidad de carbohidratos que se pueden ingerir en cada comida sin correr Surveyor, minerals. Esto es Government social research officer. Su nutricionista puede ayudarlo a calcular la cantidad de carbohidratos que debe ingerir en cada comida y en cada refrigerio. Entre los alimentos que contienen carbohidratos, se incluyen:  Pan, cereal, arroz, pastas y galletas.  Papas y maz.  Guisantes, frijoles y lentejas.  Leche y Dentist.  Nils Pyle y [place].  Postres, como pasteles, galletas, helado y caramelos. Cmo me afecta el alcohol? El alcohol puede provocar disminuciones sbitas de la glucemia (hipoglucemia), especialmente si utiliza insulina o toma determinados medicamentos por va oral para la  diabetes. La hipoglucemia es una afeccin potencialmente mortal. Los sntomas de la hipoglucemia (somnolencia, mareos y confusin) son similares a los sntomas de haber consumido demasiado alcohol. Si el mdico afirma que el alcohol es seguro para usted, [place] estas pautas:  Limite el consumo de alcohol a no ms de por da si es mujer y no est [place], y a si es hombre. Una medida equivale a 12oz ( ) de cerveza, 5oz ( ) de vino o 1oz (98ml) de bebidas alcohlicas de alta graduacin.  No beba con el estmago vaco.  Mantngase hidratado bebiendo agua, refrescos dietticos o t helado sin azcar.  Tenga en cuenta que los refrescos comunes, los jugos y otras bebida para Engineer, manufacturing pueden contener mucha azcar y se deben contar como carbohidratos. Cules son algunos consejos para seguir este plan?  Leer las etiquetas de los alimentos  Comience por leer el tamao de la porcin en la "Informacin nutricional" en las etiquetas de los alimentos envasados y las bebidas. La cantidad de caloras, carbohidratos, grasas y otros nutrientes mencionados en la etiqueta se basan en una porcin del alimento. Muchos alimentos contienen ms de una porcin por envase.  Verifique la cantidad total de gramos (g) de carbohidratos totales en una porcin. Puede calcular la cantidad de porciones de carbohidratos al dividir el total de carbohidratos por 15. Por ejemplo, si un alimento tiene un total de 30g de carbohidratos, equivale a 2 porciones de carbohidratos.  Verifique la cantidad de gramos (g) de grasas saturadas y Neurosurgeon  trans en una porcin. Escoja alimentos que no contengan grasa o que tengan un bajo contenido.  Verifique la cantidad de miligramos (mg) de sal (sodio) en una porcin. La mayora de las personas deben limitar la ingesta de sodio total a menos de 2300mg  por .  Siempre consulte la informacin nutricional de los alimentos etiquetados como "con bajo contenido de  grasa" o "sin grasa". Estos alimentos pueden tener un mayor contenido de Futures trader agregada o carbohidratos refinados, y deben evitarse.  Hable con su nutricionista para identificar sus objetivos diarios en cuanto a los nutrientes mencionados en la etiqueta. Al ir de compras  Evite comprar alimentos procesados, enlatados o precocinados. Estos alimentos tienden a International aid/development worker mayor cantidad de [place], sodio y azcar agregada.  Compre en la zona exterior de la tienda de comestibles. Esta zona incluye frutas y verduras frescas, granos a granel, carnes frescas y productos lcteos frescos. Al cocinar  Utilice mtodos de coccin a baja temperatura, como hornear, en lugar de mtodos de coccin a alta temperatura, como frer en abundante aceite.  Cocine con aceites saludables, como el aceite de [place], canola o [place].  Evite cocinar con manteca, crema o carnes con alto contenido de grasa. Planificacin de las comidas  Coma las comidas y los refrigerios regularmente, preferentemente a la misma hora todos [place]. Evite pasar largos perodos de tiempo sin comer.  Consuma alimentos ricos en fibra, como frutas frescas, verduras, frijoles y cereales integrales. Consulte a su nutricionista sobre cuntas porciones de carbohidratos puede consumir en cada comida.  Consuma entre 4 y 6 onzas (oz) de protenas magras por da, como carnes [place], pollo, pescado, huevos o tofu. Una onza de protena magra equivale a: ? 1 onza de carne, pollo o pescado. ? 1huevo. ?  taza de tofu.  Coma algunos alimentos por da que contengan grasas saludables, como aguacates, frutos secos, semillas y pescado. Estilo de vida  Controle su nivel de glucemia con regularidad.  Haga actividad fsica habitualmente como se lo haya indicado el mdico. Esto puede incluir lo siguiente: ? [place] semanales de ejercicio de intensidad moderada o alta. Esto podra incluir caminatas dinmicas, ciclismo o gimnasia acutica. ? Realizar  ejercicios de elongacin y de fortalecimiento, como yoga o levantamiento de pesas, por lo menos 2veces por semana.  Tome los se lo haya indicado el mdico.  No consuma ningn producto que contenga nicotina o tabaco, como cigarrillos y [employer]. Si necesita ayuda para dejar de fumar, consulte al Administrator, Civil Service con un asesor o instructor en diabetes para identificar estrategias para controlar el estrs y cualquier desafo emocional y social. Preguntas para hacerle al mdico  Es necesario que consulte a CIGNA en el cuidado de la diabetes?  Es necesario que me rena con un nutricionista?  A qu nmero puedo llamar si tengo preguntas?  Cules son los mejores momentos para controlar la glucemia? Dnde encontrar ms informacin:  Asociacin Estadounidense de la Diabetes (American Diabetes Association): diabetes.org  Academia de Nutricin y IT trainer (Academy of Nutrition and Dietetics): www.eatright.org  Pension scheme manager de la Diabetes y las Enfermedades Digestivas y [place] of Diabetes and Digestive and Kidney Diseases, NIH): [facility] Resumen  Un plan de alimentacin saludable lo ayudar a [email or web address] glucemia y Scientist, physiological un estilo de vida saludable.  Trabajar con un especialista en dietas y nutricin (nutricionista) puede ayudarlo a Pharmacologist de alimentacin para usted.  Tenga en cuenta que los carbohidratos (hidratos de carbono) y el alcohol tienen  efectos inmediatos en sus niveles de glucemia. Es importante contar los carbohidratos que ingiere y consumir alcohol con prudencia. Esta informacin no tiene Theme park manager el consejo del mdico. Asegrese de hacerle al mdico cualquier pregunta que tenga. Document Revised: 11/12/2016 Document Reviewed: 06/24/2016 Elsevier Patient Education  2020 Elsevier Inc.       Edwina Barth, MD [facility]

## 2019-10-18 NOTE — Assessment & Plan Note (Signed)
Much improved.  Continue Celexa 40 mg daily.

## 2019-10-19 LAB — LIPID PANEL
Chol/HDL Ratio: 3.4 ratio (ref 0.0–5.0)
Cholesterol, Total: 93 mg/dL — ABNORMAL LOW (ref 100–199)
HDL: 27 mg/dL — ABNORMAL LOW (ref >39)
LDL Chol Calc (NIH): 44 mg/dL (ref 0–99)
Triglycerides: 122 mg/dL (ref 0–149)
VLDL Cholesterol Cal: 22 mg/dL (ref 5–40)

## 2019-10-19 LAB — COMPREHENSIVE METABOLIC PANEL
ALT: 24 IU/L (ref 0–44)
AST: 20 IU/L (ref 0–40)
Albumin/Globulin Ratio: 2 (ref 1.2–2.2)
Albumin: 4.5 g/dL (ref 4.0–5.0)
Alkaline Phosphatase: 104 IU/L (ref 48–121)
BUN/Creatinine Ratio: 13 (ref 9–20)
BUN: 10 mg/dL (ref 6–24)
Bilirubin Total: 0.3 mg/dL (ref 0.0–1.2)
CO2: 20 mmol/L (ref 20–29)
Calcium: 9.6 mg/dL (ref 8.7–10.2)
Chloride: 105 mmol/L (ref 96–106)
Creatinine, Ser: 0.77 mg/dL (ref 0.76–1.27)
GFR calc Af Amer: 123 mL/min/{1.73_m2} (ref >59)
GFR calc non Af Amer: 107 mL/min/{1.73_m2} (ref >59)
Globulin, Total: 2.3 g/dL (ref 1.5–4.5)
Glucose: 110 mg/dL — ABNORMAL HIGH (ref 65–99)
Potassium: 4.5 mmol/L (ref 3.5–5.2)
Sodium: 139 mmol/L (ref 134–144)
Total Protein: 6.8 g/dL (ref 6.0–8.5)

## 2019-10-20 ENCOUNTER — Encounter: Payer: Self-pay | Admitting: Emergency Medicine

## 2019-10-20 NOTE — Telephone Encounter (Signed)
Lyrica has to be prescribed by that office.  Thanks.

## 2019-10-20 NOTE — Telephone Encounter (Signed)
Agree. Thanks

## 2019-11-09 ENCOUNTER — Other Ambulatory Visit: Payer: Self-pay | Admitting: Emergency Medicine

## 2019-11-09 DIAGNOSIS — E1165 Type 2 diabetes mellitus with hyperglycemia: Secondary | ICD-10-CM

## 2019-11-09 NOTE — Telephone Encounter (Signed)
Requested medications are due for refill today?  See Notes below.    Requested medications are on active medication list?  Yes  Last Refill:   10/18/2019  # 90 with 3 refills.  Pharmacy is reporting they did not receive this refill.  Also, as diagnosis code is needed.    Future visit scheduled?  Yes  Notes to Clinic:  Pharmacy needing a dx code and reporting did not receive RX submitted on 10/18/2019.

## 2019-11-09 NOTE — Telephone Encounter (Signed)
Requested medications are due for refill today?  See below.    Requested medications are on active medication list?  Yes  Last Refill:   10/18/2019  # 90 with 3 refills   - A DX Code is needed and pharmacy is reporting they did not receive the new RX.    Future visit scheduled? Yes  Notes to Clinic:  Pharmacy is stating they did not receive the new script on 10/18/2019.  Also, the pharmacy is requesting a dx code.

## 2019-12-19 DIAGNOSIS — Z20822 Contact with and (suspected) exposure to covid-19: Secondary | ICD-10-CM | POA: Diagnosis not present

## 2019-12-19 DIAGNOSIS — J029 Acute pharyngitis, unspecified: Secondary | ICD-10-CM | POA: Diagnosis not present

## 2019-12-19 DIAGNOSIS — Z20818 Contact with and (suspected) exposure to other bacterial communicable diseases: Secondary | ICD-10-CM | POA: Diagnosis not present

## 2020-04-17 ENCOUNTER — Encounter: Payer: Self-pay | Admitting: Emergency Medicine

## 2020-04-18 ENCOUNTER — Ambulatory Visit: Payer: BC Managed Care – PPO | Admitting: Emergency Medicine

## 2020-05-09 ENCOUNTER — Other Ambulatory Visit: Payer: Self-pay | Admitting: Emergency Medicine

## 2020-05-09 NOTE — Telephone Encounter (Signed)
Requested Prescriptions  Pending Prescriptions Disp Refills  . atorvastatin (LIPITOR) 40 MG tablet [Pharmacy Med Name: ATORVASTATIN 40 MG TABLET] 90 tablet 1    Sig: TAKE 1 TABLET BY MOUTH EVERY DAY     Cardiovascular:  Antilipid - Statins Failed - 05/09/2020 11:11 PM      Failed - Total Cholesterol in normal range and within 360 days    Cholesterol, Total  Date Value Ref Range Status  10/18/2019 93 (L) 100 - 199 mg/dL Final         Failed - LDL in normal range and within 360 days    LDL Chol Calc (NIH)  Date Value Ref Range Status  10/18/2019 44 0 - 99 mg/dL Final         Failed - HDL in normal range and within 360 days    HDL  Date Value Ref Range Status  10/18/2019 27 (L) >39 mg/dL Final         Passed - Triglycerides in normal range and within 360 days    Triglycerides  Date Value Ref Range Status  10/18/2019 122 0 - 149 mg/dL Final         Passed - Patient is not pregnant      Passed - Valid encounter within last 12 months    Recent Outpatient Visits          6 months ago Type 2 diabetes mellitus with hyperglycemia, without long-term current use of insulin [facility])   [facility] at [facility], Eilleen Kempf, MD   10 months ago Bilateral impacted cerumen   [facility], Richard, NP   10 months ago Type 2 diabetes mellitus with hyperglycemia, without long-term current use of insulin [facility])   [facility] at [facility], Kennard, MD   1 year ago Type 2 diabetes mellitus with hyperglycemia, without long-term current use of insulin [facility])   Primary Care at [facility], Eilleen Kempf, MD   1 year ago Sciatica of left side   [facility] at [facility], Eilleen Kempf, MD

## 2020-09-12 ENCOUNTER — Telehealth (INDEPENDENT_AMBULATORY_CARE_PROVIDER_SITE_OTHER): Payer: BC Managed Care – PPO | Admitting: Family Medicine

## 2020-09-12 DIAGNOSIS — U071 COVID-19: Secondary | ICD-10-CM | POA: Diagnosis not present

## 2020-09-12 MED ORDER — BENZONATATE 100 MG PO CAPS: 100.0000 mg | ORAL_CAPSULE | Freq: Three times a day (TID) | ORAL | 0 refills | Status: DC | PRN

## 2020-09-12 NOTE — Progress Notes (Signed)
Virtual Visit via Video Note  I connected with Isaac Hall  on 09/12/20 at  1:00 PM EDT by a video enabled telemedicine application and verified that I am speaking with the correct person using two identifiers.  Location patient: home, [place] Location provider:work or home office Persons participating in the virtual visit: patient, provider  I discussed the limitations of evaluation and management by telemedicine and the availability of in person appointments. The patient expressed understanding and agreed to proceed.   HPI:  Acute telemedicine visit for : -Onset: 4 days ago - did a home test when first started getting sick -Symptoms include:sore throat, cough, nasal congestion, body aches -Denies:fever, CP, SOB, NVD, inability to eat/drink/get out of bed -Pertinent past medical history: see below -Pertinent medication allergies:No Known Allergies -COVID-19 vaccine status: covid vaccines x2 and flu shot  ROS: See pertinent positives and negatives per HPI.  Past Medical History:  Diagnosis Date   Anxiety    Arthritis    Back pain    Diabetes mellitus without complication (HCC)    Hypertension    Lower extremity pain 08/2018    Past Surgical History:  Procedure Laterality Date   ABDOMINAL EXPOSURE N/A 01/05/2019   Procedure: ABDOMINAL EXPOSURE;  Surgeon: Chuck Hint, MD;  Location: [facility] OR;  Service: Vascular;  Laterality: N/A;   ANTERIOR LUMBAR FUSION N/A 01/05/2019   Procedure: Lumbar five Sacral one Anterior lumbar interbody fusion;  Surgeon: Barnett Abu, MD;  Location: [facility] OR;  Service: Neurosurgery;  Laterality: N/A;   VASECTOMY       Current Outpatient Medications:    benzonatate (TESSALON PERLES) 100 MG capsule, Take 1 capsule (100 mg total) by mouth 3 (three) times daily as needed., Disp: 20 capsule, Rfl: 0   atorvastatin (LIPITOR) 40 MG tablet, TAKE 1 TABLET BY MOUTH EVERY DAY, Disp: 90 tablet, Rfl: 1   citalopram (CELEXA) 40 MG tablet, Take 1 tablet (40 mg  total) by mouth daily., Disp: 90 tablet, Rfl: 3   diazepam (VALIUM) 5 MG tablet, Take 5 mg by mouth every 6 (six) hours as needed., Disp: , Rfl:    Dulaglutide (TRULICITY) 0.75 MG/0.5ML SOPN, Inject 0.5 mLs (0.75 mg total) into the skin once a week., Disp: 12 pen, Rfl: 5   glucose blood test strip, Use as instructed, Disp: 100 each, Rfl: 12   lisinopril (ZESTRIL) 5 MG tablet, TAKE 1 TABLET BY MOUTH EVERY DAY, Disp: 90 tablet, Rfl: 3   ONETOUCH DELICA LANCETS 33G MISC, , Disp: , Rfl:    pregabalin (LYRICA) 100 MG capsule, Take 100 mg by mouth 3 (three) times daily., Disp: , Rfl:   EXAM:  VITALS per patient if applicable:  GENERAL: alert, oriented, appears well and in no acute distress  HEENT: atraumatic, conjunttiva clear, no obvious abnormalities on inspection of external nose and ears  NECK: normal movements of the head and neck  LUNGS: on inspection no signs of respiratory distress, breathing rate appears normal, no obvious gross SOB, gasping or wheezing  CV: no obvious cyanosis  MS: moves all visible extremities without noticeable abnormality  PSYCH/NEURO: pleasant and cooperative, no obvious depression or anxiety, speech and thought processing grossly intact  ASSESSMENT AND PLAN:  Discussed the following assessment and plan:  COVID-19  -we discussed possible serious and likely etiologies, options for evaluation and workup, limitations of telemedicine visit vs in person visit, treatment, treatment risks and precautions. Pt prefers to treat via telemedicine empirically rather than in person at this moment. Query VURI, mild  influenza, COvid19 vs other.  HE plans to do covid testing today. In case of positive test, discussed treatment options, ideal treatment window, potential complications, isolation and precautions for COVID-19.  After lengthy discussion, the patient would like to do molnupirivir if he has a positive test due to being higher risk for complications of covid or severe  disease and other factors. Discussed EUA status of this drug and the fact that there is preliminary limited knowledge of risks/interactions/side effects per EUA document vs possible benefits and precautions. He wants to call back today if positive test today. He understands needs follow up visit if not today as I will only be on shift today and treatment would change if out of 5 day window. The patient did want a prescription for cough, Tessalon Rx sent.  Other symptomatic care measures summarized in patient instructions. Work/School slipped offered: provided in patient instructions  Advised to seek prompt in person care if worsening, new symptoms arise, or if is not improving with treatment. Discussed options for inperson care if PCP office not available. Did let this patient know that I only do telemedicine on Tuesdays and Thursdays for [place]. Advised to schedule follow up visit with PCP or [facility] if any further questions or concerns to avoid delays in care.   I discussed the assessment and treatment plan with the patient. The patient was provided an opportunity to ask questions and all were answered. The patient agreed with the plan and demonstrated an understanding of the instructions.     Terressa Koyanagi, DO

## 2020-09-12 NOTE — Patient Instructions (Addendum)
   ---------------------------------------------------------------------------------------------------------------------------      WORK SLIP:  Patient Isaac Hall,  [birth year], was seen for a medical visit today, 09/12/20 . Please excuse from work for a COVID like illness. We advise 10 days minimum from the onset of symptoms (09/08/20) PLUS 1 day of no fever and improved symptoms. Will defer to employer for a sooner return to work if symptoms have resolved, it is greater than 5 days since the positive test and the patient can wear a high-quality, tight fitting mask such as N95 or KN95 at all times for an additional 5 days. Would also suggest COVID19 antigen testing is negative prior to return.  Sincerely: E-signature: Dr. Kriste Basque, DO [facility] Ph: [phone]   ------------------------------------------------------------------------------------------------------------------------------   HOME CARE TIPS:  Isaac Hall COVID19 testing information: [email or web address] [place] [phone] Most pharmacies also offer testing and home test kits. If the Covid19 test is positive, please make a prompt follow up visit with your primary care office or with [facility] to discuss treatment options. Treatments for Covid19 are best given early in the course of the illness. IF you get a positive test today and want me to send treatment per our discussion, please call the [place] office. I only work today and then will not be back on shift until Thursday.   -I sent the medication(s) we discussed to your pharmacy: Meds ordered this encounter  Medications   benzonatate (TESSALON PERLES) 100 MG capsule    Sig: Take 1 capsule (100 mg total) by mouth 3 (three) times daily as needed.    Dispense:  20 capsule    Refill:  0     -can use tylenol or aleve if needed for fevers, aches and pains per instructions  -can use nasal saline a few  times per day if you have nasal congestion  -stay hydrated, drink plenty of fluids and eat small healthy meals - avoid dairy  -can take 1000 IU ( ) Vit D3 and 100-500 mg of Vit C daily per instructions  -If the Covid test is positive, check out the [facility] website for more information on home care, transmission and treatment for COVID19  -follow up with your doctor in 2-3 days unless improving and feeling better  -stay home while sick, except to seek medical care. If you have COVID19, ideally it would be best to stay home for a full 10 days since the onset of symptoms PLUS one day of no fever and feeling better. Wear a good mask that fits snugly (such as N95 or KN95) if around others to reduce the risk of transmission.  It was nice to meet you today, and I really hope you are feeling better soon. I help [facility] out with telemedicine visits on Tuesdays and Thursdays and am available for visits on those days. If you have any concerns or questions following this visit please schedule a follow up visit with your Primary Care doctor or seek care at a local urgent care clinic to avoid delays in care.    Seek in person care or schedule a follow up video visit promptly if your symptoms worsen, new concerns arise or you are not improving with treatment. Call 911 and/or seek emergency care if your symptoms are severe or life threatening.

## 2020-09-19 ENCOUNTER — Other Ambulatory Visit: Payer: Self-pay | Admitting: Emergency Medicine

## 2020-09-19 DIAGNOSIS — F418 Other specified anxiety disorders: Secondary | ICD-10-CM

## 2020-10-26 ENCOUNTER — Encounter: Payer: Self-pay | Admitting: Emergency Medicine

## 2020-10-26 ENCOUNTER — Other Ambulatory Visit: Payer: Self-pay

## 2020-10-26 ENCOUNTER — Ambulatory Visit (INDEPENDENT_AMBULATORY_CARE_PROVIDER_SITE_OTHER): Payer: BC Managed Care – PPO | Admitting: Emergency Medicine

## 2020-10-26 VITALS — BP 122/68 | HR 80 | Temp 98.7°F | Ht 64.0 in | Wt 183.0 lb

## 2020-10-26 DIAGNOSIS — Z1211 Encounter for screening for malignant neoplasm of colon: Secondary | ICD-10-CM | POA: Diagnosis not present

## 2020-10-26 DIAGNOSIS — Z0000 Encounter for general adult medical examination without abnormal findings: Secondary | ICD-10-CM

## 2020-10-26 DIAGNOSIS — E785 Hyperlipidemia, unspecified: Secondary | ICD-10-CM | POA: Diagnosis not present

## 2020-10-26 DIAGNOSIS — E1169 Type 2 diabetes mellitus with other specified complication: Secondary | ICD-10-CM | POA: Diagnosis not present

## 2020-10-26 DIAGNOSIS — Z130 Encounter for screening for diseases of the blood and blood-forming organs and certain disorders involving the immune mechanism: Secondary | ICD-10-CM

## 2020-10-26 DIAGNOSIS — E1165 Type 2 diabetes mellitus with hyperglycemia: Secondary | ICD-10-CM | POA: Diagnosis not present

## 2020-10-26 LAB — CBC WITH DIFFERENTIAL/PLATELET
Basophils Absolute: 0 10*3/uL (ref 0.0–0.1)
Basophils Relative: 0.4 % (ref 0.0–3.0)
Eosinophils Absolute: 0.2 10*3/uL (ref 0.0–0.7)
Eosinophils Relative: 3.1 % (ref 0.0–5.0)
HCT: 42.7 % (ref 39.0–52.0)
Hemoglobin: 14.3 g/dL (ref 13.0–17.0)
Lymphocytes Relative: 28.9 % (ref 12.0–46.0)
Lymphs Abs: 1.7 10*3/uL (ref 0.7–4.0)
MCHC: 33.5 g/dL (ref 30.0–36.0)
MCV: 88.9 fl (ref 78.0–100.0)
Monocytes Absolute: 0.4 10*3/uL (ref 0.1–1.0)
Monocytes Relative: 6.3 % (ref 3.0–12.0)
Neutro Abs: 3.5 10*3/uL (ref 1.4–7.7)
Neutrophils Relative %: 61.3 % (ref 43.0–77.0)
Platelets: 183 10*3/uL (ref 150.0–400.0)
RBC: 4.8 Mil/uL (ref 4.22–5.81)
RDW: 13.7 % (ref 11.5–15.5)
WBC: 5.7 10*3/uL (ref 4.0–10.5)

## 2020-10-26 LAB — POCT GLYCOSYLATED HEMOGLOBIN (HGB A1C): Hemoglobin A1C: 6.1 % — AB (ref 4.0–5.6)

## 2020-10-26 LAB — COMPREHENSIVE METABOLIC PANEL
ALT: 28 U/L (ref 0–53)
AST: 27 U/L (ref 0–37)
Albumin: 4.2 g/dL (ref 3.5–5.2)
Alkaline Phosphatase: 75 U/L (ref 39–117)
BUN: 13 mg/dL (ref 6–23)
CO2: 26 mEq/L (ref 19–32)
Calcium: 9 mg/dL (ref 8.4–10.5)
Chloride: 104 mEq/L (ref 96–112)
Creatinine, Ser: 0.69 mg/dL (ref 0.40–1.50)
GFR: 108.05 mL/min (ref >60.00)
Glucose, Bld: 97 mg/dL (ref 70–99)
Potassium: 3.8 mEq/L (ref 3.5–5.1)
Sodium: 136 mEq/L (ref 135–145)
Total Bilirubin: 0.4 mg/dL (ref 0.2–1.2)
Total Protein: 7 g/dL (ref 6.0–8.3)

## 2020-10-26 LAB — LIPID PANEL
Cholesterol: 85 mg/dL (ref 0–200)
HDL: 37 mg/dL — ABNORMAL LOW (ref >39.00)
LDL Cholesterol: 38 mg/dL (ref 0–99)
NonHDL: 48.48
Total CHOL/HDL Ratio: 2
Triglycerides: 54 mg/dL (ref 0.0–149.0)
VLDL: 10.8 mg/dL (ref 0.0–40.0)

## 2020-10-26 MED ORDER — TRULICITY 0.75 MG/0.5ML ~~LOC~~ SOAJ: 0.7500 mg | SUBCUTANEOUS | 3 refills | Status: DC

## 2020-10-26 MED ORDER — METFORMIN HCL 500 MG PO TABS: 500.0000 mg | ORAL_TABLET | Freq: Two times a day (BID) | ORAL | 3 refills | Status: DC

## 2020-10-26 NOTE — Progress Notes (Signed)
Isaac Hall 50 y.o.   Chief Complaint  Patient presents with   Annual Exam    HISTORY OF PRESENT ILLNESS: This is a 50 y.o. male here for his annual exam. Has the following chronic medical problems: #1 diabetes: On weekly Trulicity 0.75 mg and takes metformin once or twice a week.  Eating better.  Feeling better. Normal blood glucose numbers at home.  On low-dose lisinopril 5 mg daily. #2 dyslipidemia: On atorvastatin 40 mg daily. #3 hypertension: On lisinopril 5 mg daily. #4 chronic anxiety: On Celexa 40 mg daily with good results. Has no complaints or medical concerns today.  HPI   Prior to Admission medications   Medication Sig Start Date End Date Taking? Authorizing Provider  atorvastatin (LIPITOR) 40 MG tablet TAKE 1 TABLET BY MOUTH EVERY DAY 05/09/20  Yes , Eilleen Kempf, MD  benzonatate (TESSALON PERLES) 100 MG capsule Take 1 capsule (100 mg total) by mouth 3 (three) times daily as needed. 09/12/20  Yes Kriste Basque R, DO  citalopram (CELEXA) 40 MG tablet TAKE 1 TABLET BY MOUTH EVERY DAY 09/19/20  Yes , Eilleen Kempf, MD  diazepam (VALIUM) 5 MG tablet Take 5 mg by mouth every 6 (six) hours as needed. 05/29/19  Yes [provider]  glucose blood test strip Use as instructed 10/18/19  Yes , Eilleen Kempf, MD  lisinopril (ZESTRIL) 5 MG tablet TAKE 1 TABLET BY MOUTH EVERY DAY 11/10/19  Yes Georgina Quint, MD  metFORMIN (GLUCOPHAGE) 500 MG tablet Take 1 tablet (500 mg total) by mouth 2 (two) times daily with a meal. 10/26/20  Yes , Eilleen Kempf, MD  [facility] DELICA LANCETS 33G MISC  08/01/17  Yes [provider]  pregabalin (LYRICA) 100 MG capsule Take 100 mg by mouth 3 (three) times daily. 07/20/19  Yes [provider]  Dulaglutide (TRULICITY) 0.75 MG/0.5ML SOPN Inject 0.75 mg into the skin once a week. 10/26/20   Georgina Quint, MD    No Known Allergies  Patient Active Problem List   Diagnosis Date Noted    Depression with anxiety 07/12/2019   Herniated nucleus pulposus, L5-S1, left 01/05/2019   Type 2 diabetes mellitus with hyperglycemia, without long-term current use of insulin (HCC) 07/04/2014    Past Medical History:  Diagnosis Date   Anxiety    Arthritis    Back pain    Diabetes mellitus without complication (HCC)    Hypertension    Lower extremity pain 08/2018    Past Surgical History:  Procedure Laterality Date   ABDOMINAL EXPOSURE N/A 01/05/2019   Procedure: ABDOMINAL EXPOSURE;  Surgeon: Chuck Hint, MD;  Location: [facility] OR;  Service: Vascular;  Laterality: N/A;   ANTERIOR LUMBAR FUSION N/A 01/05/2019   Procedure: Lumbar five Sacral one Anterior lumbar interbody fusion;  Surgeon: Barnett Abu, MD;  Location: [facility] OR;  Service: Neurosurgery;  Laterality: N/A;   VASECTOMY      Social History   Socioeconomic History   Marital status: Married    Spouse name: Mary   Number of children: 2   Years of education: 12th grade   Highest education level: Not on file  Occupational History   Occupation: MACHINE OPERATOR    Employer: [employer]  Tobacco Use   Smoking status: Never   Smokeless tobacco: Never  Vaping Use   Vaping Use: Never used  Substance and Sexual Activity   Alcohol use: No   Drug use: No   Sexual activity: Not on file  Other Topics Concern  Not on file  Social History Narrative   Lives with his wife and their 2 children.   Originally from [place].  Came to the [place] in 1995.   Social Determinants of Health   Financial Resource Strain: Not on file  Food Insecurity: Not on file  Transportation Needs: Not on file  Physical Activity: Not on file  Stress: Not on file  Social Connections: Not on file  Intimate Partner Violence: Not on file    Family History  Problem Relation Age of Onset   Cancer Mother    Diabetes Mother    Diabetes Father    Diabetes Sister    Diabetes Sister    Diabetes Sister    Diabetes Sister    Hypertension  Sister      Review of Systems  Constitutional: Negative.  Negative for chills and fever.  HENT: Negative.  Negative for congestion and sore throat.   Respiratory: Negative.  Negative for cough and shortness of breath.   Cardiovascular: Negative.  Negative for chest pain and palpitations.  Gastrointestinal:  Negative for abdominal pain, nausea and vomiting.  Genitourinary: Negative.  Negative for dysuria and hematuria.  Skin: Negative.  Negative for rash.  Neurological: Negative.  Negative for dizziness and headaches.  All other systems reviewed and are negative.   Physical Exam Vitals reviewed.  Constitutional:      Appearance: Normal appearance.  HENT:     Head: Normocephalic.     Right Ear: Tympanic membrane, ear canal and external ear normal.     Left Ear: Tympanic membrane, ear canal and external ear normal.  Eyes:     Extraocular Movements: Extraocular movements intact.     Conjunctiva/sclera: Conjunctivae normal.     Pupils: Pupils are equal, round, and reactive to light.  Neck:     Vascular: No carotid bruit.  Cardiovascular:     Rate and Rhythm: Normal rate and regular rhythm.     Pulses: Normal pulses.     Heart sounds: Normal heart sounds.  Pulmonary:     Effort: Pulmonary effort is normal.     Breath sounds: Normal breath sounds.  Abdominal:     General: There is no distension.     Palpations: Abdomen is soft.     Tenderness: There is no abdominal tenderness.  Musculoskeletal:        General: Normal range of motion.     Cervical back: Normal range of motion and neck supple. No tenderness.  Lymphadenopathy:     Cervical: No cervical adenopathy.  Skin:    General: Skin is warm and dry.     Capillary Refill: Capillary refill takes less than 2 seconds.  Neurological:     General: No focal deficit present.     Mental Status: He is alert and oriented to person, place, and time.  Psychiatric:        Mood and Affect: Mood normal.        Behavior: Behavior  normal.    Results for orders placed or performed in visit on 10/26/20 (from the past 24 hour(s))  POCT glycosylated hemoglobin (Hb A1C)     Status: Abnormal   Collection Time: 10/26/20  8:43 AM  Result Value Ref Range   Hemoglobin A1C 6.1 (A) 4.0 - 5.6 %   HbA1c POC (<> result, manual entry)     HbA1c, POC (prediabetic range)     HbA1c, POC (controlled diabetic range)      ASSESSMENT & PLAN: Isaac Hall was seen today  for annual exam.  Diagnoses and all orders for this visit:  Routine general medical examination at a health care facility  Type 2 diabetes mellitus with hyperglycemia, without long-term current use of insulin (HCC) -     POCT glycosylated hemoglobin (Hb A1C) -     metFORMIN (GLUCOPHAGE) 500 MG tablet; Take 1 tablet (500 mg total) by mouth 2 (two) times daily with a meal. -     Dulaglutide (TRULICITY) 0.75 MG/0.5ML SOPN; Inject 0.75 mg into the skin once a week. -     Comprehensive metabolic panel  Dyslipidemia associated with type 2 diabetes mellitus (HCC) -     Lipid panel  Colon cancer screening -     Ambulatory referral to Gastroenterology  Screening for deficiency anemia -     CBC with Differential  Modifiable risk factors discussed with patient. Anticipatory guidance according to age provided. The following topics were also discussed: Social Determinants of Health Smoking Diet and nutrition Diabetes management Benefits of exercise Cancer screening and need for colonoscopy for colon cancer screening Review of most recent colonoscopy report from 2014 Vaccinations recommendations Cardiovascular risk assessment Mental health including depression and anxiety Fall and accident prevention  Patient Instructions  Health Maintenance, Male Adopting a healthy lifestyle and getting preventive care are important in promoting health and wellness. Ask your health care provider about: The right schedule for you to have regular tests and exams. Things you can do on  your own to prevent diseases and keep yourself healthy. What should I know about diet, weight, and exercise? Eat a healthy diet  Eat a diet that includes plenty of vegetables, fruits, low-fat dairy products, and lean protein. Do not eat a lot of foods that are high in solid fats, added sugars, or sodium.  Maintain a healthy weight Body mass index (BMI) is a measurement that can be used to identify possible weight problems. It estimates body fat based on height and weight. Your health care provider can help determine your BMI and help you achieve or maintain ahealthy weight. Get regular exercise Get regular exercise. This is one of the most important things you can do for your health. Most adults should: Exercise for at least 150 minutes each week. The exercise should increase your heart rate and make you sweat (moderate-intensity exercise). Do strengthening exercises at least twice a week. This is in addition to the moderate-intensity exercise. Spend less time sitting. Even light physical activity can be beneficial. Watch cholesterol and blood lipids Have your blood tested for lipids and cholesterol at 50 years of age, then havethis test every 5 years. You may need to have your cholesterol levels checked more often if: Your lipid or cholesterol levels are high. You are older than 50 years of age. You are at high risk for heart disease. What should I know about cancer screening? Many types of cancers can be detected early and may often be prevented. Depending on your health history and family history, you may need to have cancer screening at various ages. This may include screening for: Colorectal cancer. Prostate cancer. Skin cancer. Lung cancer. What should I know about heart disease, diabetes, and high blood pressure? Blood pressure and heart disease High blood pressure causes heart disease and increases the risk of stroke. This is more likely to develop in people who have high blood  pressure readings, are of African descent, or are overweight. Talk with your health care provider about your target blood pressure readings. Have your blood pressure checked:  Every 3-5 years if you are 83-56 years of age. Every year if you are 68 years old or older. If you are between the ages of 40 and 74 and are a current or former smoker, ask your health care provider if you should have a one-time screening for abdominal aortic aneurysm (AAA). Diabetes Have regular diabetes screenings. This checks your fasting blood sugar level. Have the screening done: Once every three years after age 50 if you are at a normal weight and have a low risk for diabetes. More often and at a younger age if you are overweight or have a high risk for diabetes. What should I know about preventing infection? Hepatitis B If you have a higher risk for hepatitis B, you should be screened for this virus. Talk with your health care provider to find out if you are at risk forhepatitis B infection. Hepatitis C Blood testing is recommended for: Everyone born from [birth year] through 1965. Anyone with known risk factors for hepatitis C. Sexually transmitted infections (STIs) You should be screened each year for STIs, including gonorrhea and chlamydia, if: You are sexually active and are younger than 50 years of age. You are older than 50 years of age and your health care provider tells you that you are at risk for this type of infection. Your sexual activity has changed since you were last screened, and you are at increased risk for chlamydia or gonorrhea. Ask your health care provider if you are at risk. Ask your health care provider about whether you are at high risk for HIV. Your health care provider may recommend a prescription medicine to help prevent HIV infection. If you choose to take medicine to prevent HIV, you should first get tested for HIV. You should then be tested every 3 months for as long as you are taking the  medicine. Follow these instructions at home: Lifestyle Do not use any products that contain nicotine or tobacco, such as cigarettes, e-cigarettes, and chewing tobacco. If you need help quitting, ask your health care provider. Do not use street drugs. Do not share needles. Ask your health care provider for help if you need support or information about quitting drugs. Alcohol use Do not drink alcohol if your health care provider tells you not to drink. If you drink alcohol: Limit how much you have to 0-2 drinks a day. Be aware of how much alcohol is in your drink. In the [place] one drink equals one 12 oz bottle of beer (355 mL), one 5 oz glass of wine (148 mL), or one 1 oz glass of hard liquor (44 mL). General instructions Schedule regular health, dental, and eye exams. Stay current with your vaccines. Tell your health care provider if: You often feel depressed. You have ever been abused or do not feel safe at home. Summary Adopting a healthy lifestyle and getting preventive care are important in promoting health and wellness. Follow your health care provider's instructions about healthy diet, exercising, and getting tested or screened for diseases. Follow your health care provider's instructions on monitoring your cholesterol and blood pressure. This information is not intended to replace advice given to you by your health care provider. Make sure you discuss any questions you have with your healthcare provider. Document Revised: 02/25/2018 Document Reviewed: 02/25/2018 Elsevier Patient Education  2022 Elsevier Inc.   Edwina Barth, MD  Primary Care at [facility]

## 2020-10-26 NOTE — Patient Instructions (Signed)
Health Maintenance, Male Adopting a healthy lifestyle and getting preventive care are important in promoting health and wellness. Ask your health care provider about: The right schedule for you to have regular tests and exams. Things you can do on your own to prevent diseases and keep yourself healthy. What should I know about diet, weight, and exercise? Eat a healthy diet  Eat a diet that includes plenty of vegetables, fruits, low-fat dairy products, and lean protein. Do not eat a lot of foods that are high in solid fats, added sugars, or sodium.  Maintain a healthy weight Body mass index (BMI) is a measurement that can be used to identify possible weight problems. It estimates body fat based on height and weight. Your health care provider can help determine your BMI and help you achieve or maintain ahealthy weight. Get regular exercise Get regular exercise. This is one of the most important things you can do for your health. Most adults should: Exercise for at least 150 minutes each week. The exercise should increase your heart rate and make you sweat (moderate-intensity exercise). Do strengthening exercises at least twice a week. This is in addition to the moderate-intensity exercise. Spend less time sitting. Even light physical activity can be beneficial. Watch cholesterol and blood lipids Have your blood tested for lipids and cholesterol at 50 years of age, then havethis test every 5 years. You may need to have your cholesterol levels checked more often if: Your lipid or cholesterol levels are high. You are older than 50 years of age. You are at high risk for heart disease. What should I know about cancer screening? Many types of cancers can be detected early and may often be prevented. Depending on your health history and family history, you may need to have cancer screening at various ages. This may include screening for: Colorectal cancer. Prostate cancer. Skin cancer. Lung  cancer. What should I know about heart disease, diabetes, and high blood pressure? Blood pressure and heart disease High blood pressure causes heart disease and increases the risk of stroke. This is more likely to develop in people who have high blood pressure readings, are of African descent, or are overweight. Talk with your health care provider about your target blood pressure readings. Have your blood pressure checked: Every 3-5 years if you are 18-39 years of age. Every year if you are 40 years old or older. If you are between the ages of 65 and 75 and are a current or former smoker, ask your health care provider if you should have a one-time screening for abdominal aortic aneurysm (AAA). Diabetes Have regular diabetes screenings. This checks your fasting blood sugar level. Have the screening done: Once every three years after age 45 if you are at a normal weight and have a low risk for diabetes. More often and at a younger age if you are overweight or have a high risk for diabetes. What should I know about preventing infection? Hepatitis B If you have a higher risk for hepatitis B, you should be screened for this virus. Talk with your health care provider to find out if you are at risk forhepatitis B infection. Hepatitis C Blood testing is recommended for: Everyone born from 1945 through 1965. Anyone with known risk factors for hepatitis C. Sexually transmitted infections (STIs) You should be screened each year for STIs, including gonorrhea and chlamydia, if: You are sexually active and are younger than 50 years of age. You are older than 50 years of age   and your health care provider tells you that you are at risk for this type of infection. Your sexual activity has changed since you were last screened, and you are at increased risk for chlamydia or gonorrhea. Ask your health care provider if you are at risk. Ask your health care provider about whether you are at high risk for HIV.  Your health care provider may recommend a prescription medicine to help prevent HIV infection. If you choose to take medicine to prevent HIV, you should first get tested for HIV. You should then be tested every 3 months for as long as you are taking the medicine. Follow these instructions at home: Lifestyle Do not use any products that contain nicotine or tobacco, such as cigarettes, e-cigarettes, and chewing tobacco. If you need help quitting, ask your health care provider. Do not use street drugs. Do not share needles. Ask your health care provider for help if you need support or information about quitting drugs. Alcohol use Do not drink alcohol if your health care provider tells you not to drink. If you drink alcohol: Limit how much you have to 0-2 drinks a day. Be aware of how much alcohol is in your drink. In the [place] one drink equals one 12 oz bottle of beer (355 mL), one 5 oz glass of wine (148 mL), or one 1 oz glass of hard liquor (44 mL). General instructions Schedule regular health, dental, and eye exams. Stay current with your vaccines. Tell your health care provider if: You often feel depressed. You have ever been abused or do not feel safe at home. Summary Adopting a healthy lifestyle and getting preventive care are important in promoting health and wellness. Follow your health care provider's instructions about healthy diet, exercising, and getting tested or screened for diseases. Follow your health care provider's instructions on monitoring your cholesterol and blood pressure. This information is not intended to replace advice given to you by your health care provider. Make sure you discuss any questions you have with your healthcare provider. Document Revised: 02/25/2018 Document Reviewed: 02/25/2018 Elsevier Patient Education  2022 Elsevier Inc.  

## 2020-11-03 ENCOUNTER — Other Ambulatory Visit: Payer: Self-pay | Admitting: Emergency Medicine

## 2020-11-03 DIAGNOSIS — E1165 Type 2 diabetes mellitus with hyperglycemia: Secondary | ICD-10-CM

## 2021-02-05 IMAGING — RF DG C-ARM 1-60 MIN
1 series · 2 of 2 positions shown · non-contrast
Comparison: 12/02/2014

CLINICAL DATA: Lumbar fusion

EXAM:
DG C-ARM 1-60 MIN; LUMBAR SPINE - 2-3 VIEW

[Series 1: run · 2 of 2 slices shown]
[im 1/2]
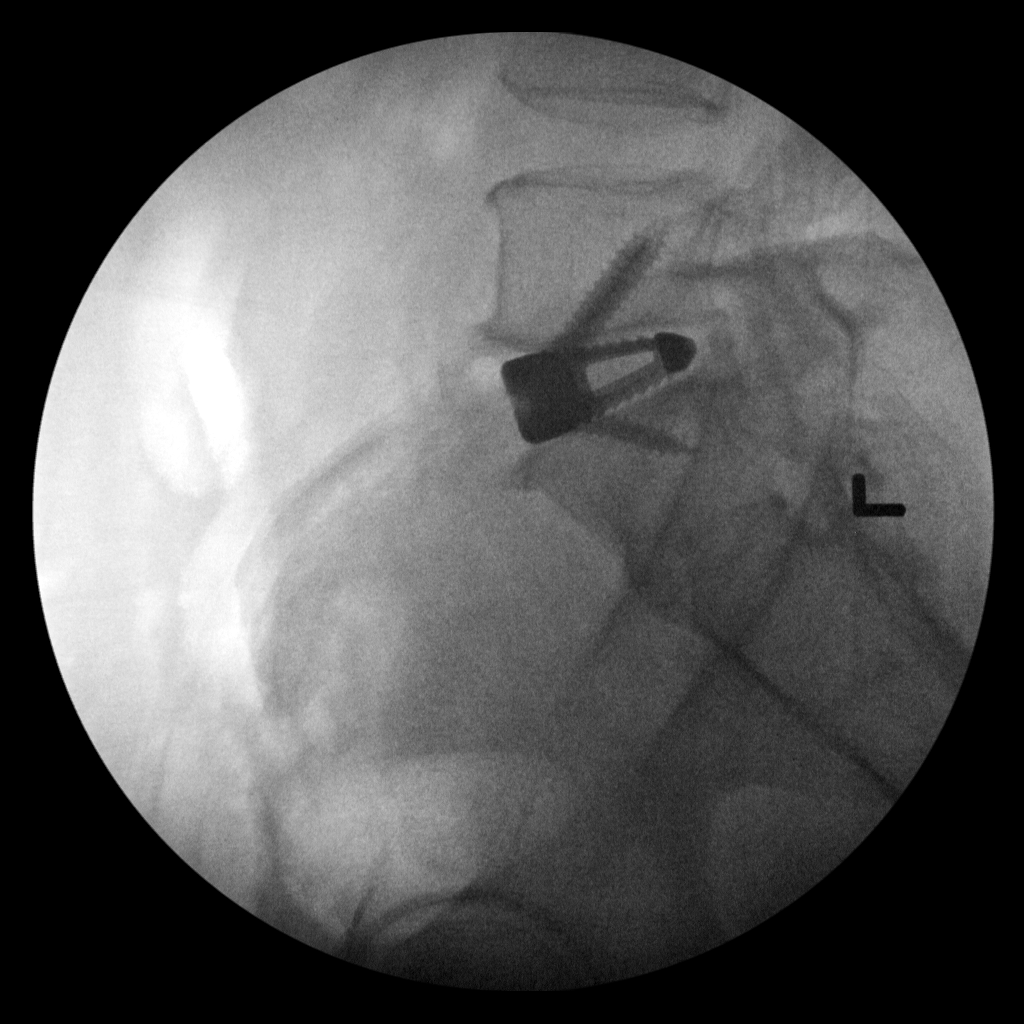
[im 2/2]
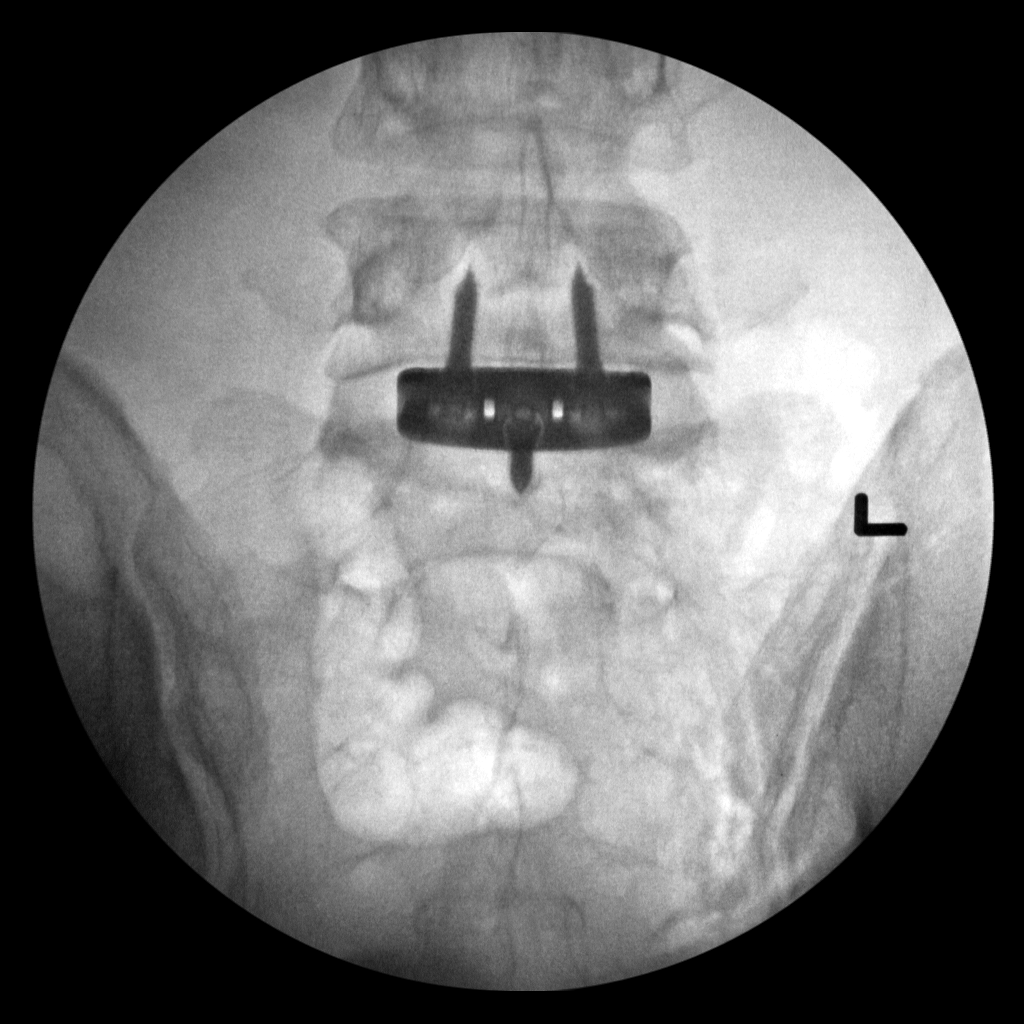

[2 of 2 positions shown; findings below may reference images not displayed]

FINDINGS: Two C-arm fluoroscopic images were obtained intraoperatively and
submitted for post operative interpretation. AP and lateral
intraoperative fluoroscopic images of the lumbar spine demonstrate
interval anterior interbody fusion at the L5-S1 level, which appears
well seated. Please see the performing provider's procedural report
for further detail.
IMPRESSION: As above.

## 2021-02-05 IMAGING — RF DG LUMBAR SPINE 2-3V
1 series · 2 of 2 positions shown · non-contrast
Comparison: 12/02/2014

CLINICAL DATA: Lumbar fusion

EXAM:
DG C-ARM 1-60 MIN; LUMBAR SPINE - 2-3 VIEW

[Series 1: run · 2 of 2 slices shown]
[im 1/2]
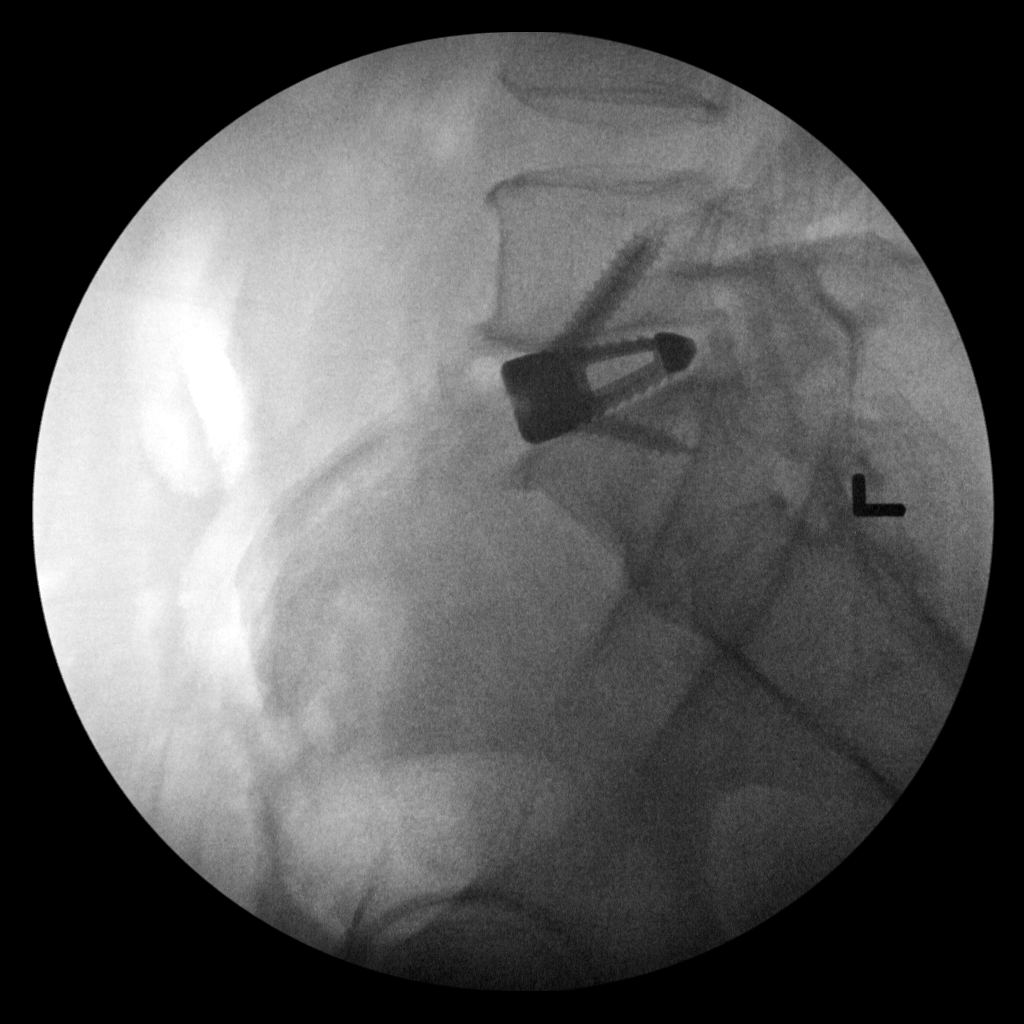
[im 2/2]
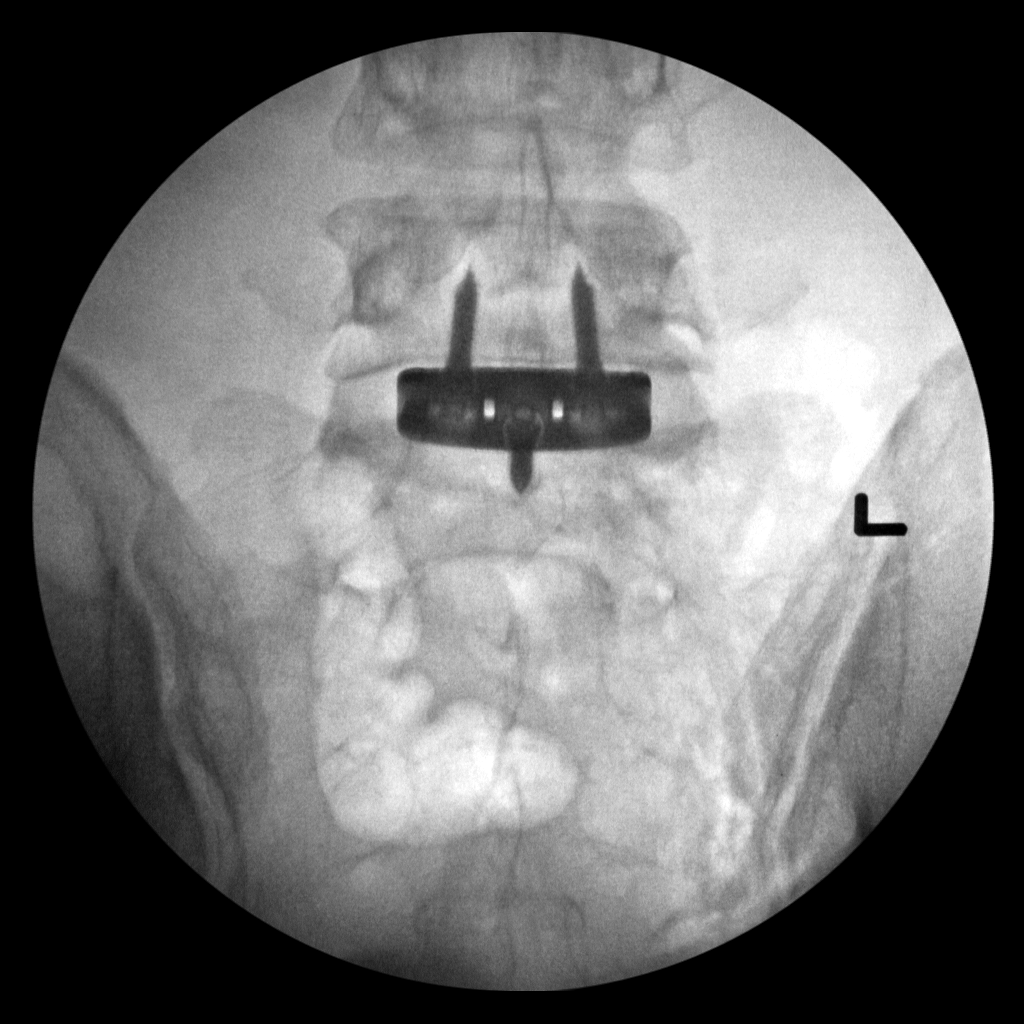

[2 of 2 positions shown; findings below may reference images not displayed]

FINDINGS: Two C-arm fluoroscopic images were obtained intraoperatively and
submitted for post operative interpretation. AP and lateral
intraoperative fluoroscopic images of the lumbar spine demonstrate
interval anterior interbody fusion at the L5-S1 level, which appears
well seated. Please see the performing provider's procedural report
for further detail.
IMPRESSION: As above.

## 2021-04-03 ENCOUNTER — Encounter: Payer: Self-pay | Admitting: Emergency Medicine

## 2021-04-04 NOTE — Telephone Encounter (Signed)
Mucinex DM over-the-counter but does he have COVID?

## 2021-04-10 NOTE — Telephone Encounter (Signed)
Thank you :)

## 2021-04-18 DIAGNOSIS — J189 Pneumonia, unspecified organism: Secondary | ICD-10-CM | POA: Diagnosis not present

## 2021-04-18 DIAGNOSIS — Z20822 Contact with and (suspected) exposure to covid-19: Secondary | ICD-10-CM | POA: Diagnosis not present

## 2021-04-27 NOTE — Telephone Encounter (Signed)
He's getting better. Prefer to see him first for re-evaluation. Thanks.

## 2021-04-30 NOTE — Telephone Encounter (Signed)
ATC patient regarding his request for a referral. Patient didn't pick up so I left a VM letting him know Dr.Sagardia would prefer to see him in person for re-evaluation first.  If patient calls back, please schedule appointment.

## 2021-05-02 ENCOUNTER — Other Ambulatory Visit: Payer: Self-pay | Admitting: Emergency Medicine

## 2021-09-25 ENCOUNTER — Other Ambulatory Visit: Payer: Self-pay | Admitting: Emergency Medicine

## 2021-09-25 DIAGNOSIS — E1165 Type 2 diabetes mellitus with hyperglycemia: Secondary | ICD-10-CM

## 2021-10-08 ENCOUNTER — Other Ambulatory Visit: Payer: Self-pay | Admitting: Emergency Medicine

## 2021-10-08 DIAGNOSIS — F418 Other specified anxiety disorders: Secondary | ICD-10-CM

## 2021-10-31 ENCOUNTER — Other Ambulatory Visit: Payer: Self-pay | Admitting: Emergency Medicine

## 2021-10-31 DIAGNOSIS — E1165 Type 2 diabetes mellitus with hyperglycemia: Secondary | ICD-10-CM

## 2021-11-30 ENCOUNTER — Other Ambulatory Visit: Payer: Self-pay | Admitting: Emergency Medicine

## 2021-11-30 DIAGNOSIS — E1165 Type 2 diabetes mellitus with hyperglycemia: Secondary | ICD-10-CM

## 2021-12-24 ENCOUNTER — Other Ambulatory Visit: Payer: Self-pay | Admitting: Emergency Medicine

## 2021-12-24 DIAGNOSIS — E1165 Type 2 diabetes mellitus with hyperglycemia: Secondary | ICD-10-CM

## 2021-12-28 ENCOUNTER — Other Ambulatory Visit: Payer: Self-pay | Admitting: Emergency Medicine

## 2021-12-28 DIAGNOSIS — E1165 Type 2 diabetes mellitus with hyperglycemia: Secondary | ICD-10-CM

## 2022-03-16 ENCOUNTER — Other Ambulatory Visit: Payer: Self-pay | Admitting: Emergency Medicine

## 2022-03-16 DIAGNOSIS — E1165 Type 2 diabetes mellitus with hyperglycemia: Secondary | ICD-10-CM

## 2022-03-19 ENCOUNTER — Encounter: Payer: Self-pay | Admitting: Emergency Medicine

## 2022-03-19 ENCOUNTER — Ambulatory Visit (INDEPENDENT_AMBULATORY_CARE_PROVIDER_SITE_OTHER): Payer: BC Managed Care – PPO | Admitting: Emergency Medicine

## 2022-03-19 VITALS — BP 128/66 | HR 90 | Temp 98.1°F | Ht 64.0 in | Wt 193.0 lb

## 2022-03-19 DIAGNOSIS — B349 Viral infection, unspecified: Secondary | ICD-10-CM

## 2022-03-19 DIAGNOSIS — Z23 Encounter for immunization: Secondary | ICD-10-CM

## 2022-03-19 DIAGNOSIS — Z1211 Encounter for screening for malignant neoplasm of colon: Secondary | ICD-10-CM | POA: Diagnosis not present

## 2022-03-19 DIAGNOSIS — E1165 Type 2 diabetes mellitus with hyperglycemia: Secondary | ICD-10-CM | POA: Diagnosis not present

## 2022-03-19 LAB — LIPID PANEL
Cholesterol: 140 mg/dL (ref 0–200)
HDL: 35.8 mg/dL — ABNORMAL LOW (ref >39.00)
LDL Cholesterol: 77 mg/dL (ref 0–99)
NonHDL: 104.06
Total CHOL/HDL Ratio: 4
Triglycerides: 137 mg/dL (ref 0.0–149.0)
VLDL: 27.4 mg/dL (ref 0.0–40.0)

## 2022-03-19 LAB — COMPREHENSIVE METABOLIC PANEL
ALT: 31 U/L (ref 0–53)
AST: 25 U/L (ref 0–37)
Albumin: 4.7 g/dL (ref 3.5–5.2)
Alkaline Phosphatase: 76 U/L (ref 39–117)
BUN: 16 mg/dL (ref 6–23)
CO2: 26 mEq/L (ref 19–32)
Calcium: 9.8 mg/dL (ref 8.4–10.5)
Chloride: 101 mEq/L (ref 96–112)
Creatinine, Ser: 0.75 mg/dL (ref 0.40–1.50)
GFR: 104.33 mL/min (ref >60.00)
Glucose, Bld: 134 mg/dL — ABNORMAL HIGH (ref 70–99)
Potassium: 4.5 mEq/L (ref 3.5–5.1)
Sodium: 136 mEq/L (ref 135–145)
Total Bilirubin: 0.5 mg/dL (ref 0.2–1.2)
Total Protein: 7.5 g/dL (ref 6.0–8.3)

## 2022-03-19 LAB — CBC WITH DIFFERENTIAL/PLATELET
Basophils Absolute: 0 10*3/uL (ref 0.0–0.1)
Basophils Relative: 0.4 % (ref 0.0–3.0)
Eosinophils Absolute: 0.2 10*3/uL (ref 0.0–0.7)
Eosinophils Relative: 2.7 % (ref 0.0–5.0)
HCT: 45.6 % (ref 39.0–52.0)
Hemoglobin: 15.5 g/dL (ref 13.0–17.0)
Lymphocytes Relative: 29.2 % (ref 12.0–46.0)
Lymphs Abs: 2 10*3/uL (ref 0.7–4.0)
MCHC: 34 g/dL (ref 30.0–36.0)
MCV: 89.4 fl (ref 78.0–100.0)
Monocytes Absolute: 0.5 10*3/uL (ref 0.1–1.0)
Monocytes Relative: 6.8 % (ref 3.0–12.0)
Neutro Abs: 4.1 10*3/uL (ref 1.4–7.7)
Neutrophils Relative %: 60.9 % (ref 43.0–77.0)
Platelets: 213 10*3/uL (ref 150.0–400.0)
RBC: 5.1 Mil/uL (ref 4.22–5.81)
RDW: 13.6 % (ref 11.5–15.5)
WBC: 6.8 10*3/uL (ref 4.0–10.5)

## 2022-03-19 LAB — POCT GLYCOSYLATED HEMOGLOBIN (HGB A1C): Hemoglobin A1C: 6.6 % — AB (ref 4.0–5.6)

## 2022-03-19 LAB — MICROALBUMIN / CREATININE URINE RATIO
Creatinine,U: 128.5 mg/dL
Microalb Creat Ratio: 0.5 mg/g (ref 0.0–30.0)
Microalb, Ur: <0.7 mg/dL (ref 0.0–1.9)

## 2022-03-19 MED ORDER — METFORMIN HCL 500 MG PO TABS: 500.0000 mg | ORAL_TABLET | Freq: Two times a day (BID) | ORAL | 3 refills | Status: DC

## 2022-03-19 MED ORDER — ATORVASTATIN CALCIUM 40 MG PO TABS: 40.0000 mg | ORAL_TABLET | Freq: Every day | ORAL | 3 refills | Status: DC

## 2022-03-19 MED ORDER — TRULICITY 0.75 MG/0.5ML ~~LOC~~ SOAJ: 0.7500 mg | SUBCUTANEOUS | 3 refills | Status: DC

## 2022-03-19 NOTE — Assessment & Plan Note (Signed)
Running its course without complications. No red flag signs or symptoms. Much improved today.

## 2022-03-19 NOTE — Patient Instructions (Signed)

## 2022-03-19 NOTE — Progress Notes (Signed)
Isaac Hall 52 y.o.   Chief Complaint  Patient presents with   Acute Visit    Sore throat, headache x 2 weeks  body aches for one day     HISTORY OF PRESENT ILLNESS: This is a 52 y.o. male here for follow-up of diabetes.  Presently on Trulicity, metformin, and atorvastatin. Had flulike symptoms for the past 2 weeks.  Much improved today. No other complaints or medical concerns today.  HPI   Prior to Admission medications   Medication Sig Start Date End Date Taking? Authorizing Provider  atorvastatin (LIPITOR) 40 MG tablet TAKE 1 TABLET BY MOUTH EVERY DAY 12/29/21  Yes , Ines Bloomer, MD  citalopram (CELEXA) 40 MG tablet TAKE 1 TABLET BY MOUTH EVERY DAY 10/08/21  Yes Horald Pollen, MD  glucose blood test strip Use as instructed 10/18/19  Yes , Ines Bloomer, MD  lisinopril (ZESTRIL) 5 MG tablet TAKE 1 TABLET BY MOUTH EVERY DAY 12/29/21  Yes Horald Pollen, MD  metFORMIN (GLUCOPHAGE) 500 MG tablet Take 1 tablet (500 mg total) by mouth 2 (two) times daily with a meal. 10/26/20  Yes [place], Ines Bloomer, MD  [place] [address] [address] [place]  08/01/17  Yes [provider]  TRULICITY 9.52 YO/3.7CH SOPN INJECT 0.75 MG INTO THE SKIN ONCE A WEEK. 09/25/21  Yes Horald Pollen, MD    No Known Allergies  Patient Active Problem List   Diagnosis Date Noted   Depression with anxiety 07/12/2019   Herniated nucleus pulposus, L5-S1, left 01/05/2019   Type 2 diabetes mellitus with hyperglycemia, without long-term current use of insulin ([facility]) 07/04/2014    Past Medical History:  Diagnosis Date   Anxiety    Arthritis    Back pain    Diabetes mellitus without complication ([facility])    Hypertension    Lower extremity pain 08/2018    Past Surgical History:  Procedure Laterality Date   ABDOMINAL EXPOSURE N/A 01/05/2019   Procedure: ABDOMINAL EXPOSURE;  Surgeon: Angelia Mould, MD;  Location: [facility];  Service: Vascular;  Laterality: N/A;    ANTERIOR LUMBAR FUSION N/A 01/05/2019   Procedure: Lumbar five Sacral one Anterior lumbar interbody fusion;  Surgeon: Kristeen Miss, MD;  Location: [facility];  Service: Neurosurgery;  Laterality: N/A;   VASECTOMY      Social History   Socioeconomic History   Marital status: Married    Spouse name: [place]   Number of children: 2   Years of education: 12th grade   Highest education level: Not on file  Occupational History   Occupation: MACHINE OPERATOR    Employer: [employer]  Tobacco Use   Smoking status: Never   Smokeless tobacco: Never  Vaping Use   Vaping Use: Never used  Substance and Sexual Activity   Alcohol use: No   Drug use: No   Sexual activity: Not on file  Other Topics Concern   Not on file  Social History Narrative   Lives with his wife and their 2 children.   Originally from [place].  Came to the [place] in 1995.   Social Determinants of Health   Financial Resource Strain: Not on file  Food Insecurity: Not on file  Transportation Needs: Not on file  Physical Activity: Not on file  Stress: Not on file  Social Connections: Not on file  Intimate Partner Violence: Not on file    Family History  Problem Relation Age of Onset   Cancer Mother    Diabetes Mother  Diabetes Father    Diabetes Sister    Diabetes Sister    Diabetes Sister    Diabetes Sister    Hypertension Sister      Review of Systems  Constitutional: Negative.  Negative for chills and fever.  HENT:  Positive for sore throat.   Respiratory: Negative.  Negative for cough and shortness of breath.   Cardiovascular: Negative.  Negative for chest pain and palpitations.  Gastrointestinal:  Negative for abdominal pain, diarrhea, nausea and vomiting.  Genitourinary: Negative.   Skin: Negative.   Neurological:  Positive for headaches.  All other systems reviewed and are negative.  Today's Vitals   03/19/22 0801  BP: 128/66  Pulse: 90  Temp: 98.1 F (36.7 C)  TempSrc: Oral  SpO2: 95%   Weight: 193 lb (87.5 kg)  Height: 5\' 4"  (1.626 m)   Body mass index is 33.13 kg/m. Wt Readings from Last 3 Encounters:  03/19/22 193 lb (87.5 kg)  10/26/20 183 lb (83 kg)  10/18/19 190 lb 6.4 oz (86.4 kg)     Physical Exam Vitals reviewed.  Constitutional:      Appearance: Normal appearance.  HENT:     Head: Normocephalic.     Mouth/Throat:     Mouth: Mucous membranes are moist.     Pharynx: Oropharynx is clear. No oropharyngeal exudate or posterior oropharyngeal erythema.  Eyes:     Extraocular Movements: Extraocular movements intact.     Pupils: Pupils are equal, round, and reactive to light.  Cardiovascular:     Rate and Rhythm: Normal rate and regular rhythm.     Pulses: Normal pulses.     Heart sounds: Normal heart sounds.  Pulmonary:     Effort: Pulmonary effort is normal.     Breath sounds: Normal breath sounds.  Musculoskeletal:     Cervical back: No tenderness.  Lymphadenopathy:     Cervical: No cervical adenopathy.  Skin:    General: Skin is warm and dry.  Neurological:     General: No focal deficit present.     Mental Status: He is alert and oriented to person, place, and time.  Psychiatric:        Mood and Affect: Mood normal.        Behavior: Behavior normal.      ASSESSMENT & PLAN: A total of 45 minutes was spent with the patient and counseling/coordination of care regarding preparing for this visit, review of most recent office visit notes, review of most recent blood work results including interpretation of today's hemoglobin A1c, cardiovascular risks associated with diabetes, education on nutrition, review of all medications, prognosis, need for colon cancer screening, need for blood work today, documentation and need for follow-up.  Problem List Items Addressed This Visit       Endocrine   Type 2 diabetes mellitus with hyperglycemia, without long-term current use of insulin ([facility]) - Primary    Well-controlled diabetes with hemoglobin A1c of  6.6. Cardiovascular risks associated with diabetes discussed Diet and nutrition discussed. Continue metformin 500 mg twice a day and weekly Trulicity 0.75 mg Continue atorvastatin 40 mg daily and lisinopril 5 mg daily. Follow-up in 6 months.      Relevant Medications   Dulaglutide (TRULICITY) 0.75 MG/0.5ML SOPN   metFORMIN (GLUCOPHAGE) 500 MG tablet   atorvastatin (LIPITOR) 40 MG tablet   Other Relevant Orders   Urine Microalbumin w/creat. ratio   Comprehensive metabolic panel   CBC with Differential/Platelet   Lipid panel  Other   Viral illness    Running its course without complications. No red flag signs or symptoms. Much improved today.      Other Visit Diagnoses     Colon cancer screening       Relevant Orders   Ambulatory referral to Gastroenterology   Need for vaccination       Relevant Orders   Flu Vaccine QUAD 6+ mos PF IM (Fluarix Quad PF)      Patient Instructions  Diabetes Mellitus and Nutrition, Adult When you have diabetes, or diabetes mellitus, it is very important to have healthy eating habits because your blood sugar (glucose) levels are greatly affected by what you eat and drink. Eating healthy foods in the right amounts, at about the same times every day, can help you: Manage your blood glucose. Lower your risk of heart disease. Improve your blood pressure. Reach or maintain a healthy weight. What can affect my meal plan? Every person with diabetes is different, and each person has different needs for a meal plan. Your health care provider may recommend that you work with a dietitian to make a meal plan that is best for you. Your meal plan may vary depending on factors such as: The calories you need. The medicines you take. Your weight. Your blood glucose, blood pressure, and cholesterol levels. Your activity level. Other health conditions you have, such as heart or kidney disease. How do carbohydrates affect me? Carbohydrates, also called  carbs, affect your blood glucose level more than any other type of food. Eating carbs raises the amount of glucose in your blood. It is important to know how many carbs you can safely have in each meal. This is different for every person. Your dietitian can help you calculate how many carbs you should have at each meal and for each snack. How does alcohol affect me? Alcohol can cause a decrease in blood glucose (hypoglycemia), especially if you use insulin or take certain diabetes medicines by mouth. Hypoglycemia can be a life-threatening condition. Symptoms of hypoglycemia, such as sleepiness, dizziness, and confusion, are similar to symptoms of having too much alcohol. Do not drink alcohol if: Your health care provider tells you not to drink. You are pregnant, may be pregnant, or are planning to become pregnant. If you drink alcohol: Limit how much you have to: 0-1 drink a day for women. 0-2 drinks a day for men. Know how much alcohol is in your drink. In the [place] one drink equals one 12 oz bottle of beer (355 mL), one 5 oz glass of wine (148 mL), or one 1 oz glass of hard liquor (44 mL). Keep yourself hydrated with water, diet soda, or unsweetened iced tea. Keep in mind that regular soda, juice, and other mixers may contain a lot of sugar and must be counted as carbs. What are tips for following this plan?  Reading food labels Start by checking the serving size on the Nutrition Facts label of packaged foods and drinks. The number of calories and the amount of carbs, fats, and other nutrients listed on the label are based on one serving of the item. Many items contain more than one serving per package. Check the total grams (g) of carbs in one serving. Check the number of grams of saturated fats and trans fats in one serving. Choose foods that have a low amount or none of these fats. Check the number of milligrams (mg) of salt (sodium) in one serving. Most people should limit total  sodium  intake to less than 2,300 mg per day. Always check the nutrition information of foods labeled as "low-fat" or "nonfat." These foods may be higher in added sugar or refined carbs and should be avoided. Talk to your dietitian to identify your daily goals for nutrients listed on the label. Shopping Avoid buying canned, pre-made, or processed foods. These foods tend to be high in fat, sodium, and added sugar. Shop around the outside edge of the grocery store. This is where you will most often find fresh fruits and vegetables, bulk grains, fresh meats, and fresh dairy products. Cooking Use low-heat cooking methods, such as baking, instead of high-heat cooking methods, such as deep frying. Cook using healthy oils, such as olive, canola, or sunflower oil. Avoid cooking with butter, cream, or high-fat meats. Meal planning Eat meals and snacks regularly, preferably at the same times every day. Avoid going long periods of time without eating. Eat foods that are high in fiber, such as fresh fruits, vegetables, beans, and whole grains. Eat 4-6 oz (112-168 g) of lean protein each day, such as lean meat, chicken, fish, eggs, or tofu. One ounce (oz) (28 g) of lean protein is equal to: 1 oz (28 g) of meat, chicken, or fish. 1 egg.  cup (62 g) of tofu. Eat some foods each day that contain healthy fats, such as avocado, nuts, seeds, and fish. What foods should I eat? Fruits Berries. Apples. Oranges. Peaches. Apricots. Plums. Grapes. Mangoes. Papayas. Pomegranates. Kiwi. Cherries. Vegetables Leafy greens, including lettuce, spinach, kale, chard, collard greens, mustard greens, and cabbage. Beets. Cauliflower. Broccoli. Carrots. Green beans. Tomatoes. Peppers. Onions. Cucumbers. Brussels sprouts. Grains Whole grains, such as whole-wheat or whole-grain bread, crackers, tortillas, cereal, and pasta. Unsweetened oatmeal. Quinoa. Brown or wild rice. Meats and other proteins Seafood. Poultry without skin. Lean  cuts of poultry and beef. Tofu. Nuts. Seeds. Dairy Low-fat or fat-free dairy products such as milk, yogurt, and cheese. The items listed above may not be a complete list of foods and beverages you can eat and drink. Contact a dietitian for more information. What foods should I avoid? Fruits Fruits canned with syrup. Vegetables Canned vegetables. Frozen vegetables with butter or cream sauce. Grains Refined white flour and flour products such as bread, pasta, snack foods, and cereals. Avoid all processed foods. Meats and other proteins Fatty cuts of meat. Poultry with skin. Breaded or fried meats. Processed meat. Avoid saturated fats. Dairy Full-fat yogurt, cheese, or milk. Beverages Sweetened drinks, such as soda or iced tea. The items listed above may not be a complete list of foods and beverages you should avoid. Contact a dietitian for more information. Questions to ask a health care provider Do I need to meet with a certified diabetes care and education specialist? Do I need to meet with a dietitian? What number can I call if I have questions? When are the best times to check my blood glucose? Where to find more information: American Diabetes Association: diabetes.org Academy of Nutrition and Dietetics: eatright.[employer] of Diabetes and Digestive and Kidney Diseases: StageSync.si Association of Diabetes Care & Education Specialists: diabeteseducator.org Summary It is important to have healthy eating habits because your blood sugar (glucose) levels are greatly affected by what you eat and drink. It is important to use alcohol carefully. A healthy meal plan will help you manage your blood glucose and lower your risk of heart disease. Your health care provider may recommend that you work with a dietitian to make a meal  plan that is best for you. This information is not intended to replace advice given to you by your health care provider. Make sure you discuss any  questions you have with your health care provider. Document Revised: 10/06/2019 Document Reviewed: 10/06/2019 Elsevier Patient Education  2023 Elsevier Inc.    Edwina Barth, MD [facility]

## 2022-03-19 NOTE — Assessment & Plan Note (Signed)
Well-controlled diabetes with hemoglobin A1c of 6.6. Cardiovascular risks associated with diabetes discussed Diet and nutrition discussed. Continue metformin 500 mg twice a day and weekly Trulicity 1.06 mg Continue atorvastatin 40 mg daily and lisinopril 5 mg daily. Follow-up in 6 months.

## 2022-03-26 ENCOUNTER — Other Ambulatory Visit: Payer: Self-pay | Admitting: Emergency Medicine

## 2022-03-26 DIAGNOSIS — E1165 Type 2 diabetes mellitus with hyperglycemia: Secondary | ICD-10-CM

## 2022-05-19 ENCOUNTER — Other Ambulatory Visit: Payer: Self-pay | Admitting: Emergency Medicine

## 2022-05-19 DIAGNOSIS — E1165 Type 2 diabetes mellitus with hyperglycemia: Secondary | ICD-10-CM

## 2022-07-04 ENCOUNTER — Encounter: Payer: Self-pay | Admitting: Internal Medicine

## 2022-07-10 ENCOUNTER — Ambulatory Visit (AMBULATORY_SURGERY_CENTER): Payer: BC Managed Care – PPO

## 2022-07-10 VITALS — Ht 64.0 in | Wt 183.0 lb

## 2022-07-10 DIAGNOSIS — Z86010 Personal history of colonic polyps: Secondary | ICD-10-CM

## 2022-07-10 DIAGNOSIS — Z1211 Encounter for screening for malignant neoplasm of colon: Secondary | ICD-10-CM

## 2022-07-10 MED ORDER — NA SULFATE-K SULFATE-MG SULF 17.5-3.13-1.6 GM/177ML PO SOLN: 1.0000 | Freq: Once | ORAL | 0 refills | Status: AC

## 2022-07-10 NOTE — Progress Notes (Signed)

## 2022-07-22 ENCOUNTER — Encounter: Payer: Self-pay | Admitting: Internal Medicine

## 2022-08-01 ENCOUNTER — Ambulatory Visit (AMBULATORY_SURGERY_CENTER): Payer: BC Managed Care – PPO | Admitting: Internal Medicine

## 2022-08-01 ENCOUNTER — Encounter: Payer: Self-pay | Admitting: Internal Medicine

## 2022-08-01 VITALS — BP 105/74 | HR 70 | Temp 97.8°F | Resp 9 | Ht 64.0 in | Wt 183.0 lb

## 2022-08-01 DIAGNOSIS — Z86010 Personal history of colonic polyps: Secondary | ICD-10-CM | POA: Diagnosis not present

## 2022-08-01 DIAGNOSIS — Z09 Encounter for follow-up examination after completed treatment for conditions other than malignant neoplasm: Secondary | ICD-10-CM | POA: Diagnosis not present

## 2022-08-01 DIAGNOSIS — Z1211 Encounter for screening for malignant neoplasm of colon: Secondary | ICD-10-CM | POA: Diagnosis not present

## 2022-08-01 MED ORDER — SODIUM CHLORIDE 0.9 % IV SOLN
500.0000 mL | Freq: Once | INTRAVENOUS | Status: DC
Start: 2022-08-01 — End: 2022-08-01

## 2022-08-01 NOTE — Progress Notes (Signed)
Pt's states no medical or surgical changes since previsit or office visit. 

## 2022-08-01 NOTE — Op Note (Signed)
[facility] Patient Name: Isaac Hall Procedure Date: 08/01/2022 10:33 AM MRN: [id number] Endoscopist: Iva Boop , MD, [id number] Age: 52 Referring MD:  Date of Birth: Apr 10, 1970 Gender: Male Account #: [id number] Procedure:                Colonoscopy Indications:              Surveillance: Personal history of adenomatous                            polyps on last colonoscopy > 5 years ago, Last                            colonoscopy: 2014 Medicines:                Monitored Anesthesia Care Procedure:                Pre-Anesthesia Assessment:                           - Prior to the procedure, a History and Physical                            was performed, and patient medications and                            allergies were reviewed. The patient's tolerance of                            previous anesthesia was also reviewed. The risks                            and benefits of the procedure and the sedation                            options and risks were discussed with the patient.                            All questions were answered, and informed consent                            was obtained. Prior Anticoagulants: The patient has                            taken no anticoagulant or antiplatelet agents. ASA                            Grade Assessment: II - A patient with mild systemic                            disease. After reviewing the risks and benefits,                            the patient was deemed in satisfactory condition to  undergo the procedure.                           After obtaining informed consent, the colonoscope                            was passed under direct vision. Throughout the                            procedure, the patient's blood pressure, pulse, and                            oxygen saturations were monitored continuously. The                            CF [id number] #[id number] was introduced  through the anus                            and advanced to the the cecum, identified by                            appendiceal orifice and ileocecal valve. The                            colonoscopy was performed without difficulty. The                            patient tolerated the procedure well. The quality                            of the bowel preparation was adequate. The                            ileocecal valve, appendiceal orifice, and rectum                            were photographed. The bowel preparation used was                            SUPREP via split dose instruction. Scope In: 10:48:01 AM Scope Out: 11:01:15 AM Scope Withdrawal Time: 0 hours 9 minutes 14 seconds  Total Procedure Duration: 0 hours 13 minutes 14 seconds  Findings:                 The perianal and digital rectal examinations were                            normal. Pertinent negatives include normal prostate                            (size, shape, and consistency).                           The exam was otherwise without abnormality on  direct and retroflexion views. Complications:            No immediate complications. Estimated Blood Loss:     Estimated blood loss: none. Impression:               - The examination was otherwise normal on direct                            and retroflexion views.                           - No specimens collected.                           - Personal history of colonic polyp - 3 mm adenomas                            2014. Recommendation:           - Patient has a contact number available for                            emergencies. The signs and symptoms of potential                            delayed complications were discussed with the                            patient. Return to normal activities tomorrow.                            Written discharge instructions were provided to the                            patient.                            - Resume previous diet.                           - Continue present medications.                           - Repeat colonoscopy in 10 years. Iva Boop, MD 08/01/2022 11:07:53 AM This report has been signed electronically.

## 2022-08-01 NOTE — Progress Notes (Signed)
[facility] History and Physical   Primary Care Physician:  Georgina Quint, MD   Reason for Procedure:   Hx colon polyp  Plan:    colonoscopy     HPI: Isaac Hall is a 52 y.o. male   4/14 - 3 mm polyp - adenoma (Dr. Elnoria Howard) Past Medical History:  Diagnosis Date   Acid reflux    Anxiety    Arthritis    Back pain    Diabetes mellitus without complication (HCC)    Hx of adenomatous polyp of colon 06/2012   Hypertension    Lower extremity pain 08/2018    Past Surgical History:  Procedure Laterality Date   ABDOMINAL EXPOSURE N/A 01/05/2019   Procedure: ABDOMINAL EXPOSURE;  Surgeon: Chuck Hint, MD;  Location: [facility] OR;  Service: Vascular;  Laterality: N/A;   ANTERIOR LUMBAR FUSION N/A 01/05/2019   Procedure: Lumbar five Sacral one Anterior lumbar interbody fusion;  Surgeon: Barnett Abu, MD;  Location: [facility] OR;  Service: Neurosurgery;  Laterality: N/A;   COLONOSCOPY W/ POLYPECTOMY  06/2012   VASECTOMY      Prior to Admission medications   Medication Sig Start Date End Date Taking? Authorizing Provider  atorvastatin (LIPITOR) 40 MG tablet Take 1 tablet (40 mg total) by mouth daily. 03/19/22  Yes Sagardia, Eilleen Kempf, MD  citalopram (CELEXA) 40 MG tablet TAKE 1 TABLET BY MOUTH EVERY DAY 10/08/21  Yes Sagardia, Eilleen Kempf, MD  diphenhydrAMINE (BENADRYL) 25 mg capsule Take 25 mg by mouth every 6 (six) hours as needed.   Yes [provider]  Ibuprofen-diphenhydrAMINE Cit (IBUPROFEN PM PO) Take by mouth.   Yes [provider]  lisinopril (ZESTRIL) 5 MG tablet TAKE 1 TABLET BY MOUTH EVERY DAY 05/20/22  Yes Sagardia, Eilleen Kempf, MD  metFORMIN (GLUCOPHAGE) 500 MG tablet Take 1 tablet (500 mg total) by mouth 2 (two) times daily with a meal. 03/19/22  Yes Sagardia, Eilleen Kempf, MD  Dulaglutide (TRULICITY) 0.75 MG/0.5ML SOPN Inject 0.75 mg into the skin once a week. 03/19/22   Georgina Quint, MD  glucose blood test strip Use as  instructed 10/18/19   Georgina Quint, MD  [facility] LANCETS 33G MISC  08/01/17   [provider]    Current Outpatient Medications  Medication Sig Dispense Refill   atorvastatin (LIPITOR) 40 MG tablet Take 1 tablet (40 mg total) by mouth daily. 90 tablet 3   citalopram (CELEXA) 40 MG tablet TAKE 1 TABLET BY MOUTH EVERY DAY 90 tablet 3   lisinopril (ZESTRIL) 5 MG tablet TAKE 1 TABLET BY MOUTH EVERY DAY 90 tablet 0   metFORMIN (GLUCOPHAGE) 500 MG tablet Take 1 tablet (500 mg total) by mouth 2 (two) times daily with a meal. 180 tablet 3   Dulaglutide (TRULICITY) 0.75 MG/0.5ML SOPN Inject 0.75 mg into the skin once a week. 6 mL 3   Current Facility-Administered Medications  Medication Dose Route Frequency Provider Last Rate Last Admin   0.9 %  sodium chloride infusion  500 mL Intravenous Once Iva Boop, MD        Allergies as of 08/01/2022   (No Known Allergies)    Family History  Problem Relation Age of Onset   Cancer Mother    Diabetes Mother    Diabetes Father    Diabetes Sister    Diabetes Sister    Diabetes Sister    Diabetes Sister    Hypertension Sister    Colon cancer Neg Hx  Rectal cancer Neg Hx    Stomach cancer Neg Hx     Social History   Socioeconomic History   Marital status: Married    Spouse name: Mary   Number of children: 2   Years of education: 12th grade   Highest education level: Not on file  Occupational History   Occupation: MACHINE OPERATOR    Employer: [employer]  Tobacco Use   Smoking status: Never   Smokeless tobacco: Never  Vaping Use   Vaping Use: Never used  Substance and Sexual Activity   Alcohol use: No   Drug use: No   Sexual activity: Not on file  Other Topics Concern   Not on file  Social History Narrative   Lives with his wife and their 2 children.   Originally from [place].  Came to the [place] in 1995.   Social Determinants of Health   Financial Resource Strain: Not on file  Food Insecurity:  Not on file  Transportation Needs: Not on file  Physical Activity: Not on file  Stress: Not on file  Social Connections: Not on file  Intimate Partner Violence: Not on file    Review of Systems:  All other review of systems negative except as mentioned in the HPI.  Physical Exam: Vital signs BP 133/89   Pulse 85   Temp 97.8 F (36.6 C)   Ht 5\' 4"  (1.626 m)   Wt 183 lb (83 kg)   SpO2 98%   BMI 31.41 kg/m   General:   Alert,  Well-developed, well-nourished, pleasant and cooperative in NAD Lungs:  Clear throughout to auscultation.   Heart:  Regular rate and rhythm; no murmurs, clicks, rubs,  or gallops. Abdomen:  Soft, nontender and nondistended. Normal bowel sounds.   Neuro/Psych:  Alert and cooperative. Normal mood and affect. A and O x 3   @  Sena Slate, MD, [facility] [phone] (pager) 08/01/2022 10:40 AM@

## 2022-08-01 NOTE — Progress Notes (Signed)
Pt resting comfortably. VSS. Airway intact. SBAR complete to RN. All questions answered.   

## 2022-08-01 NOTE — Patient Instructions (Addendum)
No polyps or cancer seen - repeat colonoscopy in 10 years 2034.  YOU HAD AN ENDOSCOPIC PROCEDURE TODAY AT THE [facility]:   Refer to the procedure report that was given to you for any specific questions about what was found during the examination.  If the procedure report does not answer your questions, please call your gastroenterologist to clarify.  If you requested that your care partner not be given the details of your procedure findings, then the procedure report has been included in a sealed envelope for you to review at your convenience later.  YOU SHOULD EXPECT: Some feelings of bloating in the abdomen. Passage of more gas than usual.  Walking can help get rid of the air that was put into your GI tract during the procedure and reduce the bloating. If you had a lower endoscopy (such as a colonoscopy or flexible sigmoidoscopy) you may notice spotting of blood in your stool or on the toilet paper. If you underwent a bowel prep for your procedure, you may not have a normal bowel movement for a few days.  Please Note:  You might notice some irritation and congestion in your nose or some drainage.  This is from the oxygen used during your procedure.  There is no need for concern and it should clear up in a day or so.  SYMPTOMS TO REPORT IMMEDIATELY:  Following lower endoscopy (colonoscopy or flexible sigmoidoscopy):  Excessive amounts of blood in the stool  Significant tenderness or worsening of abdominal pains  Swelling of the abdomen that is new, acute  Fever of 100F or higher   For urgent or emergent issues, a gastroenterologist can be reached at any hour by calling [phone]. Do not use MyChart messaging for urgent concerns.    DIET:  We do recommend a small meal at first, but then you may proceed to your regular diet.  Drink plenty of fluids but you should avoid alcoholic beverages for 24 hours.  ACTIVITY:  You should plan to take it easy for the rest of today and  you should NOT DRIVE or use heavy machinery until tomorrow (because of the sedation medicines used during the test).    FOLLOW UP: Our staff will call the number listed on your records the next business day following your procedure.  We will call around 7:15- 8:00 am to check on you and address any questions or concerns that you may have regarding the information given to you following your procedure. If we do not reach you, we will leave a message.     If any biopsies were taken you will be contacted by phone or by letter within the next 1-3 weeks.  Please call us at [phone] if you have not heard about the biopsies in 3 weeks.    SIGNATURES/CONFIDENTIALITY: You and/or your care partner have signed paperwork which will be entered into your electronic medical record.  These signatures attest to the fact that that the information above on your After Visit Summary has been reviewed and is understood.  Full responsibility of the confidentiality of this discharge information lies with you and/or your care-partner.

## 2022-08-02 ENCOUNTER — Telehealth: Payer: Self-pay

## 2022-08-02 NOTE — Telephone Encounter (Signed)
  Follow up Call-     08/01/2022   10:15 AM  Call back number  Post procedure Call Back phone  # [phone]  Permission to leave phone message Yes     Patient questions:  Do you have a fever, pain , or abdominal swelling? No. Pain Score  0 *  Have you tolerated food without any problems? Yes.    Have you been able to return to your normal activities? Yes.    Do you have any questions about your discharge instructions: Diet   No. Medications  No. Follow up visit  No.  Do you have questions or concerns about your Care? No.  Actions: * If pain score is 4 or above: No action needed, pain <4.

## 2022-09-15 ENCOUNTER — Other Ambulatory Visit: Payer: Self-pay | Admitting: Emergency Medicine

## 2022-09-15 DIAGNOSIS — F418 Other specified anxiety disorders: Secondary | ICD-10-CM

## 2022-09-24 ENCOUNTER — Other Ambulatory Visit: Payer: Self-pay | Admitting: Emergency Medicine

## 2022-09-24 DIAGNOSIS — E1165 Type 2 diabetes mellitus with hyperglycemia: Secondary | ICD-10-CM

## 2022-10-30 ENCOUNTER — Other Ambulatory Visit: Payer: Self-pay

## 2022-10-30 ENCOUNTER — Emergency Department (HOSPITAL_COMMUNITY): Payer: BC Managed Care – PPO

## 2022-10-30 ENCOUNTER — Encounter (HOSPITAL_COMMUNITY): Payer: Self-pay

## 2022-10-30 ENCOUNTER — Observation Stay (HOSPITAL_COMMUNITY)
Admission: EM | Admit: 2022-10-30 | Discharge: 2022-10-31 | Disposition: A | Discharge status: Home / Self Care | Payer: BC Managed Care – PPO | Attending: Surgery | Admitting: Surgery

## 2022-10-30 DIAGNOSIS — E119 Type 2 diabetes mellitus without complications: Secondary | ICD-10-CM | POA: Insufficient documentation

## 2022-10-30 DIAGNOSIS — Z79899 Other long term (current) drug therapy: Secondary | ICD-10-CM | POA: Insufficient documentation

## 2022-10-30 DIAGNOSIS — I10 Essential (primary) hypertension: Secondary | ICD-10-CM | POA: Insufficient documentation

## 2022-10-30 DIAGNOSIS — K3580 Unspecified acute appendicitis: Principal | ICD-10-CM | POA: Diagnosis present

## 2022-10-30 DIAGNOSIS — R1031 Right lower quadrant pain: Secondary | ICD-10-CM | POA: Diagnosis not present

## 2022-10-30 DIAGNOSIS — Z7984 Long term (current) use of oral hypoglycemic drugs: Secondary | ICD-10-CM | POA: Insufficient documentation

## 2022-10-30 DIAGNOSIS — Z7985 Long-term (current) use of injectable non-insulin antidiabetic drugs: Secondary | ICD-10-CM | POA: Diagnosis not present

## 2022-10-30 DIAGNOSIS — R109 Unspecified abdominal pain: Secondary | ICD-10-CM | POA: Diagnosis not present

## 2022-10-30 DIAGNOSIS — N400 Enlarged prostate without lower urinary tract symptoms: Secondary | ICD-10-CM | POA: Diagnosis not present

## 2022-10-30 LAB — URINALYSIS, ROUTINE W REFLEX MICROSCOPIC
Bacteria, UA: NONE SEEN
Bilirubin Urine: NEGATIVE
Glucose, UA: NEGATIVE mg/dL
Ketones, ur: NEGATIVE mg/dL
Leukocytes,Ua: NEGATIVE
Nitrite: NEGATIVE
Protein, ur: NEGATIVE mg/dL
Specific Gravity, Urine: 1.02 (ref 1.005–1.030)
pH: 7 (ref 5.0–8.0)

## 2022-10-30 LAB — COMPREHENSIVE METABOLIC PANEL
ALT: 32 U/L (ref 0–44)
AST: 27 U/L (ref 15–41)
Albumin: 4.6 g/dL (ref 3.5–5.0)
Alkaline Phosphatase: 72 U/L (ref 38–126)
Anion gap: 10 (ref 5–15)
BUN: 14 mg/dL (ref 6–20)
CO2: 22 mmol/L (ref 22–32)
Calcium: 9.3 mg/dL (ref 8.9–10.3)
Chloride: 103 mmol/L (ref 98–111)
Creatinine, Ser: 0.64 mg/dL (ref 0.61–1.24)
GFR, Estimated: >60 mL/min (ref >60)
Glucose, Bld: 100 mg/dL — ABNORMAL HIGH (ref 70–99)
Potassium: 3.7 mmol/L (ref 3.5–5.1)
Sodium: 135 mmol/L (ref 135–145)
Total Bilirubin: 0.9 mg/dL (ref 0.3–1.2)
Total Protein: 7.6 g/dL (ref 6.5–8.1)

## 2022-10-30 LAB — CBC WITH DIFFERENTIAL/PLATELET
Abs Immature Granulocytes: 0.02 10*3/uL (ref 0.00–0.07)
Basophils Absolute: 0 10*3/uL (ref 0.0–0.1)
Basophils Relative: 1 %
Eosinophils Absolute: 0.2 10*3/uL (ref 0.0–0.5)
Eosinophils Relative: 2 %
HCT: 45.2 % (ref 39.0–52.0)
Hemoglobin: 15.3 g/dL (ref 13.0–17.0)
Immature Granulocytes: 0 %
Lymphocytes Relative: 25 %
Lymphs Abs: 2.2 10*3/uL (ref 0.7–4.0)
MCH: 30.3 pg (ref 26.0–34.0)
MCHC: 33.8 g/dL (ref 30.0–36.0)
MCV: 89.5 fL (ref 80.0–100.0)
Monocytes Absolute: 0.6 10*3/uL (ref 0.1–1.0)
Monocytes Relative: 6 %
Neutro Abs: 5.7 10*3/uL (ref 1.7–7.7)
Neutrophils Relative %: 66 %
Platelets: 195 10*3/uL (ref 150–400)
RBC: 5.05 MIL/uL (ref 4.22–5.81)
RDW: 12.5 % (ref 11.5–15.5)
WBC: 8.6 10*3/uL (ref 4.0–10.5)
nRBC: 0 % (ref 0.0–0.2)

## 2022-10-30 LAB — LIPASE, BLOOD: Lipase: 29 U/L (ref 11–51)

## 2022-10-30 MED ORDER — ONDANSETRON HCL 4 MG/2ML IJ SOLN
4.0000 mg | Freq: Once | INTRAMUSCULAR | Status: AC
  Administered 2022-10-30: 4 mg via INTRAVENOUS
  Filled 2022-10-30: qty 2

## 2022-10-30 MED ORDER — MORPHINE SULFATE (PF) 4 MG/ML IV SOLN
4.0000 mg | Freq: Once | INTRAVENOUS | Status: AC
  Administered 2022-10-30: 4 mg via INTRAVENOUS
  Filled 2022-10-30: qty 1

## 2022-10-30 MED ORDER — IOHEXOL 300 MG/ML  SOLN
100.0000 mL | Freq: Once | INTRAMUSCULAR | Status: AC | PRN
  Administered 2022-10-30: 100 mL via INTRAVENOUS

## 2022-10-30 NOTE — ED Provider Notes (Signed)
Isaac Hall EMERGENCY DEPARTMENT AT [facility] Provider Note   CSN: [id number] Arrival date & time: 10/30/22  2001     History {Add pertinent medical, surgical, social history, OB history to HPI:1} Chief Complaint  Patient presents with   Abdominal Pain    Isaac Hall is a 52 y.o. male.  HPI     This is a 52 year old male who presents with abdominal pain.  Patient reports 24-hour history of worsening periumbilical and right lower quadrant abdominal pain.  Reports anorexia and nausea.  No vomiting or diarrhea.  Has not had a bowel movement today.  No fevers.  Went to urgent care and was referred to the ED for possible CT imaging.  Home Medications Prior to Admission medications   Medication Sig Start Date End Date Taking? Authorizing Provider  atorvastatin (LIPITOR) 40 MG tablet Take 1 tablet (40 mg total) by mouth daily. 03/19/22   Georgina Quint, MD  citalopram (CELEXA) 40 MG tablet TAKE 1 TABLET BY MOUTH EVERY DAY 09/15/22   Georgina Quint, MD  Dulaglutide (TRULICITY) 0.75 MG/0.5ML SOPN Inject 0.75 mg into the skin once a week. 03/19/22   Georgina Quint, MD  lisinopril (ZESTRIL) 5 MG tablet TAKE 1 TABLET BY MOUTH EVERY DAY 09/24/22   Georgina Quint, MD  metFORMIN (GLUCOPHAGE) 500 MG tablet Take 1 tablet (500 mg total) by mouth 2 (two) times daily with a meal. 03/19/22   Georgina Quint, MD      Allergies    Patient has no known allergies.    Review of Systems   Review of Systems  Constitutional:  Negative for fever.  Respiratory:  Negative for shortness of breath.   Cardiovascular:  Negative for chest pain.  Gastrointestinal:  Positive for abdominal pain and nausea. Negative for diarrhea and vomiting.  All other systems reviewed and are negative.   Physical Exam Updated Vital Signs BP 128/88   Pulse 82   Temp 98.4 F (36.9 C) (Oral)   Resp 17   Ht 1.626 m (5\' 4" )   Wt 83 kg   SpO2 100%   BMI 31.41 kg/m  Physical  Exam Vitals and nursing note reviewed.  Constitutional:      Appearance: He is well-developed. He is not ill-appearing.  HENT:     Head: Normocephalic and atraumatic.  Eyes:     Pupils: Pupils are equal, round, and reactive to light.  Cardiovascular:     Rate and Rhythm: Normal rate and regular rhythm.     Heart sounds: Normal heart sounds. No murmur heard. Pulmonary:     Effort: Pulmonary effort is normal. No respiratory distress.     Breath sounds: Normal breath sounds. No wheezing.  Abdominal:     General: Bowel sounds are normal.     Palpations: Abdomen is soft.     Tenderness: There is abdominal tenderness in the right lower quadrant. There is no rebound. Positive signs include Rovsing's sign.  Musculoskeletal:     Cervical back: Neck supple.  Lymphadenopathy:     Cervical: No cervical adenopathy.  Skin:    General: Skin is warm and dry.  Neurological:     Mental Status: He is alert and oriented to person, place, and time.  Psychiatric:        Mood and Affect: Mood normal.     ED Results / Procedures / Treatments   Labs (all labs ordered are listed, but only abnormal results are displayed) Labs Reviewed  COMPREHENSIVE METABOLIC  PANEL - Abnormal; Notable for the following components:      Result Value   Glucose, Bld 100 (*)    All other components within normal limits  URINALYSIS, ROUTINE W REFLEX MICROSCOPIC - Abnormal; Notable for the following components:   Hgb urine dipstick SMALL (*)    All other components within normal limits  LIPASE, BLOOD  CBC WITH DIFFERENTIAL/PLATELET    EKG None  Radiology No results found.  Procedures Procedures  {Document cardiac monitor, telemetry assessment procedure when appropriate:1}  Medications Ordered in ED Medications  morphine (PF) 4 MG/ML injection 4 mg (has no administration in time range)  ondansetron (ZOFRAN) injection 4 mg (has no administration in time range)    ED Course/ Medical Decision Making/ A&P    {   Click here for ABCD2, HEART and other calculatorsREFRESH Note before signing :1}                              Medical Decision Making Amount and/or Complexity of Data Reviewed Labs: ordered. Radiology: ordered.  Risk Prescription drug management.   ***  {Document critical care time when appropriate:1} {Document review of labs and clinical decision tools ie heart score, Chads2Vasc2 etc:1}  {Document your independent review of radiology images, and any outside records:1} {Document your discussion with family members, caretakers, and with consultants:1} {Document social determinants of health affecting pt's care:1} {Document your decision making why or why not admission, treatments were needed:1} Final Clinical Impression(s) / ED Diagnoses Final diagnoses:  None    Rx / DC Orders ED Discharge Orders     None

## 2022-10-30 NOTE — ED Triage Notes (Signed)
Pt reports with RLQ abdominal pain that started last night. Pt went to urgent care today and they told him that he may have appendicitis.

## 2022-10-31 ENCOUNTER — Encounter (HOSPITAL_COMMUNITY)
Admission: EM | Disposition: A | Discharge status: Home / Self Care | Payer: Self-pay | Source: Home / Self Care | Attending: Emergency Medicine

## 2022-10-31 ENCOUNTER — Observation Stay (HOSPITAL_COMMUNITY): Payer: BC Managed Care – PPO | Admitting: Anesthesiology

## 2022-10-31 ENCOUNTER — Other Ambulatory Visit: Payer: Self-pay

## 2022-10-31 ENCOUNTER — Encounter (HOSPITAL_COMMUNITY): Payer: Self-pay

## 2022-10-31 DIAGNOSIS — K353 Acute appendicitis with localized peritonitis, without perforation or gangrene: Secondary | ICD-10-CM | POA: Diagnosis not present

## 2022-10-31 DIAGNOSIS — K3580 Unspecified acute appendicitis: Secondary | ICD-10-CM | POA: Diagnosis present

## 2022-10-31 DIAGNOSIS — Z79899 Other long term (current) drug therapy: Secondary | ICD-10-CM | POA: Diagnosis not present

## 2022-10-31 DIAGNOSIS — E1165 Type 2 diabetes mellitus with hyperglycemia: Secondary | ICD-10-CM | POA: Diagnosis not present

## 2022-10-31 DIAGNOSIS — E119 Type 2 diabetes mellitus without complications: Secondary | ICD-10-CM | POA: Diagnosis not present

## 2022-10-31 DIAGNOSIS — I10 Essential (primary) hypertension: Secondary | ICD-10-CM | POA: Diagnosis not present

## 2022-10-31 HISTORY — PX: LAPAROSCOPIC APPENDECTOMY: SHX408

## 2022-10-31 LAB — GLUCOSE, CAPILLARY
Glucose-Capillary: 131 mg/dL — ABNORMAL HIGH (ref 70–99)
Glucose-Capillary: 128 mg/dL — ABNORMAL HIGH (ref 70–99)

## 2022-10-31 LAB — HIV ANTIBODY (ROUTINE TESTING W REFLEX): HIV Screen 4th Generation wRfx: NONREACTIVE

## 2022-10-31 SURGERY — APPENDECTOMY, LAPAROSCOPIC
Anesthesia: General

## 2022-10-31 MED ORDER — ACETAMINOPHEN 500 MG PO TABS
1000.0000 mg | ORAL_TABLET | Freq: Four times a day (QID) | ORAL | Status: DC
  Administered 2022-10-31 (×2): 1000 mg via ORAL
  Filled 2022-10-31 (×2): qty 2

## 2022-10-31 MED ORDER — INSULIN ASPART 100 UNIT/ML IJ SOLN: 0.0000 [IU] | INTRAMUSCULAR | Status: DC | PRN

## 2022-10-31 MED ORDER — ACETAMINOPHEN 10 MG/ML IV SOLN: 1000.0000 mg | Freq: Once | INTRAVENOUS | Status: DC | PRN

## 2022-10-31 MED ORDER — ONDANSETRON HCL 4 MG/2ML IJ SOLN
INTRAMUSCULAR | Status: DC | PRN
  Administered 2022-10-31: 4 mg via INTRAVENOUS

## 2022-10-31 MED ORDER — KCL IN DEXTROSE-NACL 20-5-0.45 MEQ/L-%-% IV SOLN
INTRAVENOUS | Status: DC
  Filled 2022-10-31 (×2): qty 1000

## 2022-10-31 MED ORDER — MIDAZOLAM HCL 5 MG/5ML IJ SOLN
INTRAMUSCULAR | Status: DC | PRN
  Administered 2022-10-31: 2 mg via INTRAVENOUS

## 2022-10-31 MED ORDER — IBUPROFEN 200 MG PO TABS: 600.0000 mg | ORAL_TABLET | Freq: Three times a day (TID) | ORAL | 0 refills | Status: DC | PRN

## 2022-10-31 MED ORDER — LACTATED RINGERS IR SOLN
Status: DC | PRN
  Administered 2022-10-31: 1000 mL

## 2022-10-31 MED ORDER — LISINOPRIL 5 MG PO TABS
5.0000 mg | ORAL_TABLET | Freq: Every day | ORAL | Status: DC
  Filled 2022-10-31: qty 1

## 2022-10-31 MED ORDER — SODIUM CHLORIDE 0.45 % IV SOLN: INTRAVENOUS | Status: DC

## 2022-10-31 MED ORDER — EPHEDRINE SULFATE (PRESSORS) 50 MG/ML IJ SOLN
INTRAMUSCULAR | Status: DC | PRN
  Administered 2022-10-31 (×3): 10 mg via INTRAVENOUS

## 2022-10-31 MED ORDER — METRONIDAZOLE 500 MG/100ML IV SOLN
500.0000 mg | Freq: Once | INTRAVENOUS | Status: AC
  Administered 2022-10-31: 500 mg via INTRAVENOUS
  Filled 2022-10-31: qty 100

## 2022-10-31 MED ORDER — LACTATED RINGERS IV SOLN: INTRAVENOUS | Status: DC

## 2022-10-31 MED ORDER — ROCURONIUM BROMIDE 100 MG/10ML IV SOLN
INTRAVENOUS | Status: DC | PRN
  Administered 2022-10-31: 30 mg via INTRAVENOUS
  Administered 2022-10-31: 20 mg via INTRAVENOUS

## 2022-10-31 MED ORDER — PROPOFOL 10 MG/ML IV BOLUS
INTRAVENOUS | Status: AC
  Filled 2022-10-31: qty 20

## 2022-10-31 MED ORDER — FENTANYL CITRATE (PF) 250 MCG/5ML IJ SOLN
INTRAMUSCULAR | Status: DC | PRN
  Administered 2022-10-31: 150 ug via INTRAVENOUS
  Administered 2022-10-31: 50 ug via INTRAVENOUS

## 2022-10-31 MED ORDER — OXYCODONE HCL 5 MG PO TABS: 5.0000 mg | ORAL_TABLET | ORAL | Status: DC | PRN

## 2022-10-31 MED ORDER — OXYCODONE HCL 5 MG/5ML PO SOLN: 5.0000 mg | Freq: Once | ORAL | Status: DC | PRN

## 2022-10-31 MED ORDER — ORAL CARE MOUTH RINSE: 15.0000 mL | Freq: Once | OROMUCOSAL | Status: AC

## 2022-10-31 MED ORDER — ONDANSETRON HCL 4 MG/2ML IJ SOLN: 4.0000 mg | Freq: Four times a day (QID) | INTRAMUSCULAR | Status: DC | PRN

## 2022-10-31 MED ORDER — DOCUSATE SODIUM 100 MG PO CAPS
100.0000 mg | ORAL_CAPSULE | Freq: Two times a day (BID) | ORAL | Status: DC
  Administered 2022-10-31: 100 mg via ORAL
  Filled 2022-10-31: qty 1

## 2022-10-31 MED ORDER — PROPOFOL 10 MG/ML IV BOLUS
INTRAVENOUS | Status: DC | PRN
  Administered 2022-10-31: 150 mg via INTRAVENOUS

## 2022-10-31 MED ORDER — FENTANYL CITRATE (PF) 250 MCG/5ML IJ SOLN
INTRAMUSCULAR | Status: AC
  Filled 2022-10-31: qty 5

## 2022-10-31 MED ORDER — FENTANYL CITRATE PF 50 MCG/ML IJ SOSY
25.0000 ug | PREFILLED_SYRINGE | INTRAMUSCULAR | Status: DC | PRN
  Administered 2022-10-31: 50 ug via INTRAVENOUS

## 2022-10-31 MED ORDER — ONDANSETRON 4 MG PO TBDP: 4.0000 mg | ORAL_TABLET | Freq: Four times a day (QID) | ORAL | Status: DC | PRN

## 2022-10-31 MED ORDER — ACETAMINOPHEN 500 MG PO TABS: 1000.0000 mg | ORAL_TABLET | Freq: Four times a day (QID) | ORAL | Status: DC | PRN

## 2022-10-31 MED ORDER — EPHEDRINE 5 MG/ML INJ
INTRAVENOUS | Status: AC
  Filled 2022-10-31: qty 10

## 2022-10-31 MED ORDER — CHLORHEXIDINE GLUCONATE 0.12 % MT SOLN
15.0000 mL | Freq: Once | OROMUCOSAL | Status: AC
  Administered 2022-10-31: 15 mL via OROMUCOSAL

## 2022-10-31 MED ORDER — SUCCINYLCHOLINE CHLORIDE 200 MG/10ML IV SOSY
PREFILLED_SYRINGE | INTRAVENOUS | Status: DC | PRN
  Administered 2022-10-31: 80 mg via INTRAVENOUS

## 2022-10-31 MED ORDER — STERILE WATER FOR IRRIGATION IR SOLN
Status: DC | PRN
  Administered 2022-10-31: 1000 mL

## 2022-10-31 MED ORDER — DIPHENHYDRAMINE HCL 50 MG/ML IJ SOLN: 25.0000 mg | Freq: Four times a day (QID) | INTRAMUSCULAR | Status: DC | PRN

## 2022-10-31 MED ORDER — MIDAZOLAM HCL 2 MG/2ML IJ SOLN
INTRAMUSCULAR | Status: AC
  Filled 2022-10-31: qty 2

## 2022-10-31 MED ORDER — OXYCODONE HCL 5 MG PO TABS
5.0000 mg | ORAL_TABLET | ORAL | Status: DC | PRN
  Administered 2022-10-31 (×2): 10 mg via ORAL
  Filled 2022-10-31 (×2): qty 2

## 2022-10-31 MED ORDER — DIPHENHYDRAMINE HCL 25 MG PO CAPS: 25.0000 mg | ORAL_CAPSULE | Freq: Four times a day (QID) | ORAL | Status: DC | PRN

## 2022-10-31 MED ORDER — OXYCODONE HCL 5 MG PO TABS: 5.0000 mg | ORAL_TABLET | ORAL | 0 refills | Status: DC | PRN

## 2022-10-31 MED ORDER — ACETAMINOPHEN 650 MG RE SUPP: 650.0000 mg | Freq: Four times a day (QID) | RECTAL | Status: DC | PRN

## 2022-10-31 MED ORDER — LIDOCAINE HCL (CARDIAC) PF 100 MG/5ML IV SOSY
PREFILLED_SYRINGE | INTRAVENOUS | Status: DC | PRN
  Administered 2022-10-31: 60 mg via INTRAVENOUS

## 2022-10-31 MED ORDER — SIMETHICONE 80 MG PO CHEW: 40.0000 mg | CHEWABLE_TABLET | Freq: Four times a day (QID) | ORAL | Status: DC | PRN

## 2022-10-31 MED ORDER — BUPIVACAINE HCL (PF) 0.25 % IJ SOLN
INTRAMUSCULAR | Status: AC
  Filled 2022-10-31: qty 30

## 2022-10-31 MED ORDER — TRAMADOL HCL 50 MG PO TABS: 50.0000 mg | ORAL_TABLET | Freq: Four times a day (QID) | ORAL | Status: DC | PRN

## 2022-10-31 MED ORDER — OXYCODONE HCL 5 MG PO TABS: 5.0000 mg | ORAL_TABLET | Freq: Once | ORAL | Status: DC | PRN

## 2022-10-31 MED ORDER — 0.9 % SODIUM CHLORIDE (POUR BTL) OPTIME
TOPICAL | Status: DC | PRN
  Administered 2022-10-31: 1000 mL

## 2022-10-31 MED ORDER — GLYCOPYRROLATE 0.2 MG/ML IJ SOLN
INTRAMUSCULAR | Status: DC | PRN
  Administered 2022-10-31: .2 mg via INTRAVENOUS

## 2022-10-31 MED ORDER — FENTANYL CITRATE PF 50 MCG/ML IJ SOSY
PREFILLED_SYRINGE | INTRAMUSCULAR | Status: AC
  Administered 2022-10-31: 50 ug via INTRAVENOUS
  Filled 2022-10-31: qty 2

## 2022-10-31 MED ORDER — ACETAMINOPHEN 325 MG PO TABS: 650.0000 mg | ORAL_TABLET | Freq: Four times a day (QID) | ORAL | Status: DC | PRN

## 2022-10-31 MED ORDER — HYDROMORPHONE HCL 1 MG/ML IJ SOLN: 0.5000 mg | INTRAMUSCULAR | Status: DC | PRN

## 2022-10-31 MED ORDER — DEXAMETHASONE SODIUM PHOSPHATE 4 MG/ML IJ SOLN
INTRAMUSCULAR | Status: DC | PRN
  Administered 2022-10-31: 5 mg via INTRAVENOUS

## 2022-10-31 MED ORDER — SUGAMMADEX SODIUM 200 MG/2ML IV SOLN
INTRAVENOUS | Status: DC | PRN
  Administered 2022-10-31: 200 mg via INTRAVENOUS

## 2022-10-31 MED ORDER — LACTATED RINGERS IV SOLN: INTRAVENOUS | Status: DC | PRN

## 2022-10-31 MED ORDER — GLYCOPYRROLATE 0.2 MG/ML IJ SOLN
INTRAMUSCULAR | Status: AC
  Filled 2022-10-31: qty 1

## 2022-10-31 MED ORDER — METFORMIN HCL 500 MG PO TABS: 500.0000 mg | ORAL_TABLET | Freq: Two times a day (BID) | ORAL | Status: DC

## 2022-10-31 MED ORDER — PNEUMOCOCCAL 20-VAL CONJ VACC 0.5 ML IM SUSY: 0.5000 mL | PREFILLED_SYRINGE | INTRAMUSCULAR | Status: DC | PRN

## 2022-10-31 MED ORDER — HYDROMORPHONE HCL 1 MG/ML IJ SOLN
1.0000 mg | INTRAMUSCULAR | Status: DC | PRN
  Administered 2022-10-31: 1 mg via INTRAVENOUS
  Filled 2022-10-31: qty 1

## 2022-10-31 MED ORDER — AMISULPRIDE (ANTIEMETIC) 5 MG/2ML IV SOLN: 10.0000 mg | Freq: Once | INTRAVENOUS | Status: DC | PRN

## 2022-10-31 MED ORDER — ENOXAPARIN SODIUM 30 MG/0.3ML IJ SOSY
30.0000 mg | PREFILLED_SYRINGE | Freq: Two times a day (BID) | INTRAMUSCULAR | Status: DC
  Administered 2022-10-31: 30 mg via SUBCUTANEOUS
  Filled 2022-10-31: qty 0.3

## 2022-10-31 MED ORDER — FENTANYL CITRATE (PF) 100 MCG/2ML IJ SOLN
INTRAMUSCULAR | Status: AC
  Filled 2022-10-31: qty 2

## 2022-10-31 MED ORDER — BUPIVACAINE HCL (PF) 0.25 % IJ SOLN
INTRAMUSCULAR | Status: DC | PRN
  Administered 2022-10-31: 20 mL

## 2022-10-31 MED ORDER — SODIUM CHLORIDE 0.9 % IV SOLN
2.0000 g | Freq: Once | INTRAVENOUS | Status: AC
  Administered 2022-10-31: 2 g via INTRAVENOUS
  Filled 2022-10-31: qty 20

## 2022-10-31 MED ORDER — PROMETHAZINE HCL 25 MG/ML IJ SOLN: 6.2500 mg | INTRAMUSCULAR | Status: DC | PRN

## 2022-10-31 SURGICAL SUPPLY — 33 items
ADH SKN CLS APL DERMABOND .7 (GAUZE/BANDAGES/DRESSINGS) ×1
APL PRP STRL LF DISP 70% ISPRP (MISCELLANEOUS) ×1
BAG COUNTER SPONGE SURGICOUNT (BAG) IMPLANT
BAG SPNG CNTER NS LX DISP (BAG)
CHLORAPREP W/TINT 26 (MISCELLANEOUS) ×1 IMPLANT
COVER SURGICAL LIGHT HANDLE (MISCELLANEOUS) ×1 IMPLANT
CUTTER FLEX LINEAR 45M (STAPLE) IMPLANT
DERMABOND ADVANCED .7 DNX12 (GAUZE/BANDAGES/DRESSINGS) ×1 IMPLANT
ELECT PENCIL ROCKER SW 15FT (MISCELLANEOUS) IMPLANT
ELECT REM PT RETURN 15FT ADLT (MISCELLANEOUS) ×1 IMPLANT
GLOVE SURG ORTHO 8.0 STRL STRW (GLOVE) ×2 IMPLANT
GOWN STRL REUS W/ TWL XL LVL3 (GOWN DISPOSABLE) ×2 IMPLANT
GOWN STRL REUS W/TWL XL LVL3 (GOWN DISPOSABLE) ×2
IRRIG SUCT STRYKERFLOW 2 WTIP (MISCELLANEOUS) ×1
IRRIGATION SUCT STRKRFLW 2 WTP (MISCELLANEOUS) ×1 IMPLANT
KIT BASIN OR (CUSTOM PROCEDURE TRAY) ×1 IMPLANT
KIT TURNOVER KIT A (KITS) IMPLANT
RELOAD 45 VASCULAR/THIN (ENDOMECHANICALS) IMPLANT
RELOAD STAPLE 45 2.5 WHT GRN (ENDOMECHANICALS) IMPLANT
RELOAD STAPLE 45 3.5 BLU ETS (ENDOMECHANICALS) IMPLANT
RELOAD STAPLE TA45 3.5 REG BLU (ENDOMECHANICALS) ×1 IMPLANT
SET TUBE SMOKE EVAC HIGH FLOW (TUBING) ×1 IMPLANT
SHEARS HARMONIC ACE PLUS 36CM (ENDOMECHANICALS) ×1 IMPLANT
SPIKE FLUID TRANSFER (MISCELLANEOUS) ×1 IMPLANT
SUT MNCRL AB 4-0 PS2 18 (SUTURE) ×1 IMPLANT
SYS BAG RETRIEVAL 10MM (BASKET) ×1
SYSTEM BAG RETRIEVAL 10MM (BASKET) ×1 IMPLANT
TOWEL OR 17X26 10 PK STRL BLUE (TOWEL DISPOSABLE) ×1 IMPLANT
TOWEL OR NON WOVEN STRL DISP B (DISPOSABLE) ×1 IMPLANT
TRAY LAPAROSCOPIC (CUSTOM PROCEDURE TRAY) ×1 IMPLANT
TROCAR 11X100 Z THREAD (TROCAR) ×1 IMPLANT
TROCAR BALLN 12MMX100 BLUNT (TROCAR) ×1 IMPLANT
TROCAR Z-THREAD OPTICAL 5X100M (TROCAR) ×1 IMPLANT

## 2022-10-31 NOTE — Discharge Instructions (Addendum)
You had IV contrast for your CT scan.  DO NOT take your Metformin until 11/02/22  [facility]  Please arrive at least 30 min before your appointment to complete your check in paperwork.  If you are unable to arrive 30 min prior to your appointment time we may have to cancel or reschedule you. LAPAROSCOPIC SURGERY: POST OP INSTRUCTIONS Always review your discharge instruction sheet given to you by the facility where your surgery was performed. IF YOU HAVE DISABILITY OR FAMILY LEAVE FORMS, YOU MUST BRING THEM TO THE OFFICE FOR PROCESSING.   DO NOT GIVE THEM TO YOUR DOCTOR.  PAIN CONTROL  First take acetaminophen (Tylenol) AND/or ibuprofen (Advil) to control your pain after surgery.  Follow directions on package.  Taking acetaminophen (Tylenol) and/or ibuprofen (Advil) regularly after surgery will help to control your pain and lower the amount of prescription pain medication you may need.  You should not take more than 4,000 mg (4 grams) of acetaminophen (Tylenol) in 24 hours.  You should not take ibuprofen (Advil), aleve, motrin, naprosyn or other NSAIDS if you have a history of stomach ulcers or chronic kidney disease.  A prescription for pain medication may be given to you upon discharge.  Take your pain medication as prescribed, if you still have uncontrolled pain after taking acetaminophen (Tylenol) or ibuprofen (Advil). Use ice packs to help control pain. If you need a refill on your pain medication, please contact your pharmacy.  They will contact our office to request authorization. Prescriptions will not be filled after 5pm or on week-ends.  HOME MEDICATIONS Take your usually prescribed medications unless otherwise directed.  DIET You should follow a light diet the first few days after arrival home.  Be sure to include lots of fluids daily. Avoid fatty, fried foods.   CONSTIPATION It is common to experience some constipation after surgery and if you are taking pain  medication.  Increasing fluid intake and taking a stool softener (such as Colace) will usually help or prevent this problem from occurring.  A mild laxative (Milk of Magnesia or Miralax) should be taken according to package instructions if there are no bowel movements after 48 hours.  WOUND/INCISION CARE Most patients will experience some swelling and bruising in the area of the incisions.  Ice packs will help.  Swelling and bruising can take several days to resolve.  Unless discharge instructions indicate otherwise, follow guidelines below  STERI-STRIPS - you may remove your outer bandages 48 hours after surgery, and you may shower at that time.  You have steri-strips (small skin tapes) in place directly over the incision.  These strips should be left on the skin for 7-10 days.   DERMABOND/SKIN GLUE - you may shower in 24 hours.  The glue will flake off over the next 2-3 weeks. Any sutures or staples will be removed at the office during your follow-up visit.  ACTIVITIES You may resume regular (light) daily activities beginning the next day--such as daily self-care, walking, climbing stairs--gradually increasing activities as tolerated.  You may have sexual intercourse when it is comfortable.  Refrain from any heavy lifting or straining until approved by your doctor. You may drive when you are no longer taking prescription pain medication, you can comfortably wear a seatbelt, and you can safely maneuver your car and apply brakes.  FOLLOW-UP You should see your doctor in the office for a follow-up appointment approximately 2-3 weeks after your surgery.  You should have been given your post-op/follow-up  appointment when your surgery was scheduled.  If you did not receive a post-op/follow-up appointment, make sure that you call for this appointment within a day or two after you arrive home to insure a convenient appointment time.   WHEN TO CALL YOUR DOCTOR: Fever over 101.0 Inability to  urinate Continued bleeding from incision. Increased pain, redness, or drainage from the incision. Increasing abdominal pain  The clinic staff is available to answer your questions during regular business hours.  Please don't hesitate to call and ask to speak to one of the nurses for clinical concerns.  If you have a medical emergency, go to the nearest emergency room or call 911.  A surgeon from [facility] is always on call at the hospital. [address], [place], [place]  [address] ? [address] [address], [place], [place]   [address] [phone] ? [phone] ? FAX [phone]     Managing Your Pain After Surgery Without Opioids    Thank you for participating in our program to help patients manage their pain after surgery without opioids. This is part of our effort to provide you with the best care possible, without exposing you or your family to the risk that opioids pose.  What pain can I expect after surgery? You can expect to have some pain after surgery. This is normal. The pain is typically worse the day after surgery, and quickly begins to get better. Many studies have found that many patients are able to manage their pain after surgery with Over-the-Counter (OTC) medications such as Tylenol and Motrin. If you have a condition that does not allow you to take Tylenol or Motrin, notify your surgical team.  How will I manage my pain? The best strategy for controlling your pain after surgery is around the clock pain control with Tylenol (acetaminophen) and Motrin (ibuprofen or Advil). Alternating these medications with each other allows you to maximize your pain control. In addition to Tylenol and Motrin, you can use heating pads or ice packs on your incisions to help reduce your pain.  How will I alternate your regular strength over-the-counter pain medication? You will take a dose of pain medication every three hours. Start by taking 650 mg of Tylenol (2  pills of 325 mg) 3 hours later take 600 mg of Motrin (3 pills of 200 mg) 3 hours after taking the Motrin take 650 mg of Tylenol 3 hours after that take 600 mg of Motrin.   - 1 -  See example - if your first dose of Tylenol is at 12:00 PM   12:00 PM Tylenol 650 mg (2 pills of 325 mg)  3:00 PM Motrin 600 mg (3 pills of 200 mg)  6:00 PM Tylenol 650 mg (2 pills of 325 mg)  9:00 PM Motrin 600 mg (3 pills of 200 mg)  Continue alternating every 3 hours   We recommend that you follow this schedule around-the-clock for at least 3 days after surgery, or until you feel that it is no longer needed. Use the table on the last page of this handout to keep track of the medications you are taking. Important: Do not take more than 3000mg  of Tylenol or 3200mg  of Motrin in a 24-hour period. Do not take ibuprofen/Motrin if you have a history of bleeding stomach ulcers, severe kidney disease, &/or actively taking a blood thinner  What if I still have pain? If you have pain that is not controlled with the over-the-counter pain medications (Tylenol  and Motrin or Advil) you might have what we call "breakthrough" pain. You will receive a prescription for a small amount of an opioid pain medication such as Oxycodone, Tramadol, or Tylenol with Codeine. Use these opioid pills in the first 24 hours after surgery if you have breakthrough pain. Do not take more than 1 pill every 4-6 hours.  If you still have uncontrolled pain after using all opioid pills, don't hesitate to call our staff using the number provided. We will help make sure you are managing your pain in the best way possible, and if necessary, we can provide a prescription for additional pain medication.   Day 1    Time  Name of Medication Number of pills taken  Amount of Acetaminophen  Pain Level   Comments  AM PM       AM PM       AM PM       AM PM       AM PM       AM PM       AM PM       AM PM       Total Daily amount of  Acetaminophen Do not take more than  3,000 mg per day      Day 2    Time  Name of Medication Number of pills taken  Amount of Acetaminophen  Pain Level   Comments  AM PM       AM PM       AM PM       AM PM       AM PM       AM PM       AM PM       AM PM       Total Daily amount of Acetaminophen Do not take more than  3,000 mg per day      Day 3    Time  Name of Medication Number of pills taken  Amount of Acetaminophen  Pain Level   Comments  AM PM       AM PM       AM PM       AM PM         AM PM       AM PM       AM PM       AM PM       Total Daily amount of Acetaminophen Do not take more than  3,000 mg per day      Day 4    Time  Name of Medication Number of pills taken  Amount of Acetaminophen  Pain Level   Comments  AM PM       AM PM       AM PM       AM PM       AM PM       AM PM       AM PM       AM PM       Total Daily amount of Acetaminophen Do not take more than  3,000 mg per day      Day 5    Time  Name of Medication Number of pills taken  Amount of Acetaminophen  Pain Level   Comments  AM PM       AM PM       AM PM       AM  PM       AM PM       AM PM       AM PM       AM PM       Total Daily amount of Acetaminophen Do not take more than  3,000 mg per day      Day 6    Time  Name of Medication Number of pills taken  Amount of Acetaminophen  Pain Level  Comments  AM PM       AM PM       AM PM       AM PM       AM PM       AM PM       AM PM       AM PM       Total Daily amount of Acetaminophen Do not take more than  3,000 mg per day      Day 7    Time  Name of Medication Number of pills taken  Amount of Acetaminophen  Pain Level   Comments  AM PM       AM PM       AM PM       AM PM       AM PM       AM PM       AM PM       AM PM       Total Daily amount of Acetaminophen Do not take more than  3,000 mg per day        For additional information about how and where to safely dispose  of unused opioid medications - [email or web address]  Disclaimer: This document contains information and/or instructional materials adapted from [facility] for the typical patient with your condition. It does not replace medical advice from your health care provider because your experience may differ from that of the typical patient. Talk to your health care provider if you have any questions about this document, your condition or your treatment plan. Adapted from [facility]

## 2022-10-31 NOTE — Anesthesia Preprocedure Evaluation (Addendum)
Anesthesia Evaluation  Patient identified by MRN, date of birth, ID band Patient awake    Reviewed: Allergy & Precautions, NPO status , Patient's Chart, lab work & pertinent test results  Airway Mallampati: II  TM Distance: >3 FB Neck ROM: Full    Dental no notable dental hx.    Pulmonary neg pulmonary ROS   Pulmonary exam normal        Cardiovascular hypertension, Pt. on medications Normal cardiovascular exam     Neuro/Psych  PSYCHIATRIC DISORDERS Anxiety Depression    negative neurological ROS     GI/Hepatic negative GI ROS, Neg liver ROS,,,  Endo/Other  diabetes, Oral Hypoglycemic Agents  Patient on GLP-1 Agonist!  Renal/GU negative Renal ROS     Musculoskeletal  (+) Arthritis ,    Abdominal  (+) + obese  Peds  Hematology negative hematology ROS (+)   Anesthesia Other Findings acute appendicitis  Reproductive/Obstetrics                             Anesthesia Physical Anesthesia Plan  ASA: 3  Anesthesia Plan: General   Post-op Pain Management:    Induction: Intravenous  PONV Risk Score and Plan: 2 and Ondansetron, Dexamethasone, Midazolam and Treatment may vary due to age or medical condition  Airway Management Planned: Oral ETT  Additional Equipment:   Intra-op Plan:   Post-operative Plan: Extubation in OR  Informed Consent: I have reviewed the patients History and Physical, chart, labs and discussed the procedure including the risks, benefits and alternatives for the proposed anesthesia with the patient or authorized representative who has indicated his/her understanding and acceptance.     Dental advisory given  Plan Discussed with: CRNA  Anesthesia Plan Comments:         Anesthesia Quick Evaluation

## 2022-10-31 NOTE — H&P (Signed)
CC: abd pain  HPI: Isaac Hall is an 52 y.o. male who is here for RLQ pain that started last night.  Presented to urgent care today and subsequently sent to ED.  Pain started periumbilical then migrated to RLQ.  Associated with nausea.  Past Medical History:  Diagnosis Date   Acid reflux    Anxiety    Arthritis    Back pain    Diabetes mellitus without complication (HCC)    Hx of adenomatous polyp of colon 06/2012   Hypertension    Lower extremity pain 08/2018    Past Surgical History:  Procedure Laterality Date   ABDOMINAL EXPOSURE N/A 01/05/2019   Procedure: ABDOMINAL EXPOSURE;  Surgeon: Chuck Hint, MD;  Location: [facility] OR;  Service: Vascular;  Laterality: N/A;   ANTERIOR LUMBAR FUSION N/A 01/05/2019   Procedure: Lumbar five Sacral one Anterior lumbar interbody fusion;  Surgeon: Barnett Abu, MD;  Location: [facility] OR;  Service: Neurosurgery;  Laterality: N/A;   COLONOSCOPY W/ POLYPECTOMY  06/2012   VASECTOMY      Family History  Problem Relation Age of Onset   Cancer Mother    Diabetes Mother    Diabetes Father    Diabetes Sister    Diabetes Sister    Diabetes Sister    Diabetes Sister    Hypertension Sister    Colon cancer Neg Hx    Rectal cancer Neg Hx    Stomach cancer Neg Hx     Social:  reports that he has never smoked. He has never used smokeless tobacco. He reports that he does not drink alcohol and does not use drugs.  Allergies: No Known Allergies  Medications: I have reviewed the patient's current medications.  Results for orders placed or performed during the hospital encounter of 10/30/22 (from the past 48 hour(s))  Urinalysis, Routine w reflex microscopic -Urine, Clean Catch     Status: Abnormal   Collection Time: 10/30/22  8:27 PM  Result Value Ref Range   Color, Urine YELLOW YELLOW   APPearance CLEAR CLEAR   Specific Gravity, Urine 1.020 1.005 - 1.030   pH 7.0 5.0 - 8.0   Glucose, UA NEGATIVE NEGATIVE mg/dL   Hgb urine  dipstick SMALL (A) NEGATIVE   Bilirubin Urine NEGATIVE NEGATIVE   Ketones, ur NEGATIVE NEGATIVE mg/dL   Protein, ur NEGATIVE NEGATIVE mg/dL   Nitrite NEGATIVE NEGATIVE   Leukocytes,Ua NEGATIVE NEGATIVE   RBC / HPF 6-10 0 - 5 RBC/hpf   WBC, UA 0-5 0 - 5 WBC/hpf   Bacteria, UA NONE SEEN NONE SEEN   Squamous Epithelial / HPF 0-5 0 - 5 /HPF   Mucus PRESENT     Comment: Performed at [facility], [address] [place], [place] [address]  Comprehensive metabolic panel     Status: Abnormal   Collection Time: 10/30/22  8:40 PM  Result Value Ref Range   Sodium 135 135 - 145 mmol/L   Potassium 3.7 3.5 - 5.1 mmol/L   Chloride 103 98 - 111 mmol/L   CO2 22 22 - 32 mmol/L   Glucose, Bld 100 (H) 70 - 99 mg/dL    Comment: Glucose reference range applies only to samples taken after fasting for at least 8 hours.   BUN 14 6 - 20 mg/dL   Creatinine, Ser 2.95 0.61 - 1.24 mg/dL   Calcium 9.3 8.9 - 28.4 mg/dL   Total Protein 7.6 6.5 - 8.1 g/dL   Albumin 4.6 3.5 -  5.0 g/dL   AST 27 15 - 41 U/L   ALT 32 0 - 44 U/L   Alkaline Phosphatase 72 38 - 126 U/L   Total Bilirubin 0.9 0.3 - 1.2 mg/dL   GFR, Estimated >16 >10 mL/min    Comment: (NOTE) Calculated using the CKD-EPI Creatinine Equation (2021)    Anion gap 10 5 - 15    Comment: Performed at [facility], [address] [place], [place] [address]  Lipase, blood     Status: None   Collection Time: 10/30/22  8:40 PM  Result Value Ref Range   Lipase 29 11 - 51 U/L    Comment: Performed at [facility], [address], [place], [place] [address]  CBC with Diff     Status: None   Collection Time: 10/30/22  8:40 PM  Result Value Ref Range   WBC 8.6 4.0 - 10.5 K/uL   RBC 5.05 4.22 - 5.81 MIL/uL   Hemoglobin 15.3 13.0 - 17.0 g/dL   HCT 19.1 47.8 - 29.5 %   MCV 89.5 80.0 - 100.0 fL   MCH 30.3 26.0 - 34.0 pg   MCHC 33.8 30.0 - 36.0 g/dL   RDW 62.1 30.8 - 65.7 %   Platelets 195 150 - 400  K/uL   nRBC 0.0 0.0 - 0.2 %   Neutrophils Relative % 66 %   Neutro Abs 5.7 1.7 - 7.7 K/uL   Lymphocytes Relative 25 %   Lymphs Abs 2.2 0.7 - 4.0 K/uL   Monocytes Relative 6 %   Monocytes Absolute 0.6 0.1 - 1.0 K/uL   Eosinophils Relative 2 %   Eosinophils Absolute 0.2 0.0 - 0.5 K/uL   Basophils Relative 1 %   Basophils Absolute 0.0 0.0 - 0.1 K/uL   Immature Granulocytes 0 %   Abs Immature Granulocytes 0.02 0.00 - 0.07 K/uL    Comment: Performed at [facility], [address] [place], [place] [address]    CT ABDOMEN PELVIS W CONTRAST  Result Date: 10/31/2022 CLINICAL DATA:  Abdominal pain, acute, nonlocalized EXAM: CT ABDOMEN AND PELVIS WITH CONTRAST TECHNIQUE: Multidetector CT imaging of the abdomen and pelvis was performed using the standard protocol following bolus administration of intravenous contrast. RADIATION DOSE REDUCTION: This exam was performed according to the departmental dose-optimization program which includes automated exposure control, adjustment of the mA and/or kV according to patient size and/or use of iterative reconstruction technique. CONTRAST:  OMNIPAQUE IOHEXOL 300 MG/ML  SOLN COMPARISON:  CT lumbar spine 04/21/2019, CT abdomen pelvis 02/02/98 report without imaging FINDINGS: Lower chest: No acute abnormality. Hepatobiliary: The hepatic parenchyma is diffusely hypodense compared to the splenic parenchyma consistent with fatty infiltration. No focal liver abnormality. No gallstones, gallbladder wall thickening, or pericholecystic fluid. No biliary dilatation. Pancreas: No focal lesion. Normal pancreatic contour. No surrounding inflammatory changes. No main pancreatic ductal dilatation. Spleen: Normal in size without focal abnormality. Adrenals/Urinary Tract: No adrenal nodule bilaterally. Bilateral kidneys enhance symmetrically. No hydronephrosis. No hydroureter. The urinary bladder is unremarkable. On delayed imaging, there is no urothelial wall  thickening and there are no filling defects in the opacified portions of the bilateral collecting systems or ureters. Stomach/Bowel: Stomach is within normal limits. No evidence of bowel wall thickening or dilatation. The appendix is enlarged in caliber measuring up to 11 mm with associated appendiceal wall hyperemia. Associated mild periappendiceal fat stranding. No appendiceal wall discontinuity. No appendicolith. Vascular/Lymphatic: No abdominal aorta or iliac aneurysm. Mild atherosclerotic  plaque of the aorta and its branches. No abdominal, pelvic, or inguinal lymphadenopathy. Reproductive: The prostate is enlarged measuring up to 5.2 cm. Other: no intraperitoneal free fluid. No intraperitoneal free gas. No organized fluid collection. Musculoskeletal: No abdominal wall hernia or abnormality. No suspicious lytic or blastic osseous lesions. No acute displaced fracture. L5-S1 anterior and interbody surgical hardware. IMPRESSION: 1. Non-perforated acute appendicitis. 2. Prostatomegaly. 3.  Aortic Atherosclerosis (ICD10-I70.0). Electronically Signed   By: Tish Frederickson M.D.   On: 10/31/2022 00:07    ROS - all of the below systems have been reviewed with the patient and positives are indicated with bold text General: chills, fever or night sweats Eyes: blurry vision or double vision ENT: epistaxis or sore throat Hematologic/Lymphatic: bleeding problems, blood clots or swollen lymph nodes Endocrine: temperature intolerance or unexpected weight changes Breast: new or changing breast lumps or nipple discharge Resp: cough, shortness of breath, or wheezing CV: chest pain or dyspnea on exertion GI: as per HPI GU: dysuria, trouble voiding, or hematuria Neuro: TIA or stroke symptoms    PE Blood pressure 125/83, pulse 83, temperature 98.2 F (36.8 C), temperature source Oral, resp. rate 18, height 5\' 4"  (1.626 m), weight 83 kg, SpO2 98%. Constitutional: NAD; conversant; no deformities Eyes: Moist  conjunctiva; no lid lag; anicteric; PERRL Neck: Trachea midline; no thyromegaly Lungs: Normal respiratory effort CV: RRR GI: Abd TTP RLQ MSK: Normal range of motion of extremities; no clubbing/cyanosis Psychiatric: Appropriate affect; alert and oriented x3  Results for orders placed or performed during the hospital encounter of 10/30/22 (from the past 48 hour(s))  Urinalysis, Routine w reflex microscopic -Urine, Clean Catch     Status: Abnormal   Collection Time: 10/30/22  8:27 PM  Result Value Ref Range   Color, Urine YELLOW YELLOW   APPearance CLEAR CLEAR   Specific Gravity, Urine 1.020 1.005 - 1.030   pH 7.0 5.0 - 8.0   Glucose, UA NEGATIVE NEGATIVE mg/dL   Hgb urine dipstick SMALL (A) NEGATIVE   Bilirubin Urine NEGATIVE NEGATIVE   Ketones, ur NEGATIVE NEGATIVE mg/dL   Protein, ur NEGATIVE NEGATIVE mg/dL   Nitrite NEGATIVE NEGATIVE   Leukocytes,Ua NEGATIVE NEGATIVE   RBC / HPF 6-10 0 - 5 RBC/hpf   WBC, UA 0-5 0 - 5 WBC/hpf   Bacteria, UA NONE SEEN NONE SEEN   Squamous Epithelial / HPF 0-5 0 - 5 /HPF   Mucus PRESENT     Comment: Performed at [facility], [address], [place], [place] [address]  Comprehensive metabolic panel     Status: Abnormal   Collection Time: 10/30/22  8:40 PM  Result Value Ref Range   Sodium 135 135 - 145 mmol/L   Potassium 3.7 3.5 - 5.1 mmol/L   Chloride 103 98 - 111 mmol/L   CO2 22 22 - 32 mmol/L   Glucose, Bld 100 (H) 70 - 99 mg/dL    Comment: Glucose reference range applies only to samples taken after fasting for at least 8 hours.   BUN 14 6 - 20 mg/dL   Creatinine, Ser 9.14 0.61 - 1.24 mg/dL   Calcium 9.3 8.9 - 78.2 mg/dL   Total Protein 7.6 6.5 - 8.1 g/dL   Albumin 4.6 3.5 - 5.0 g/dL   AST 27 15 - 41 U/L   ALT 32 0 - 44 U/L   Alkaline Phosphatase 72 38 - 126 U/L   Total Bilirubin 0.9 0.3 - 1.2 mg/dL   GFR, Estimated >95 >62 mL/min  Comment: (NOTE) Calculated using the CKD-EPI Creatinine Equation (2021)    Anion  gap 10 5 - 15    Comment: Performed at [facility], [address] [place], [place] [address]  Lipase, blood     Status: None   Collection Time: 10/30/22  8:40 PM  Result Value Ref Range   Lipase 29 11 - 51 U/L    Comment: Performed at [facility], [address], [place], [place] [address]  CBC with Diff     Status: None   Collection Time: 10/30/22  8:40 PM  Result Value Ref Range   WBC 8.6 4.0 - 10.5 K/uL   RBC 5.05 4.22 - 5.81 MIL/uL   Hemoglobin 15.3 13.0 - 17.0 g/dL   HCT 86.5 78.4 - 69.6 %   MCV 89.5 80.0 - 100.0 fL   MCH 30.3 26.0 - 34.0 pg   MCHC 33.8 30.0 - 36.0 g/dL   RDW 29.5 28.4 - 13.2 %   Platelets 195 150 - 400 K/uL   nRBC 0.0 0.0 - 0.2 %   Neutrophils Relative % 66 %   Neutro Abs 5.7 1.7 - 7.7 K/uL   Lymphocytes Relative 25 %   Lymphs Abs 2.2 0.7 - 4.0 K/uL   Monocytes Relative 6 %   Monocytes Absolute 0.6 0.1 - 1.0 K/uL   Eosinophils Relative 2 %   Eosinophils Absolute 0.2 0.0 - 0.5 K/uL   Basophils Relative 1 %   Basophils Absolute 0.0 0.0 - 0.1 K/uL   Immature Granulocytes 0 %   Abs Immature Granulocytes 0.02 0.00 - 0.07 K/uL    Comment: Performed at [facility], [address], [place], [place] [address]    CT ABDOMEN PELVIS W CONTRAST  Result Date: 10/31/2022 CLINICAL DATA:  Abdominal pain, acute, nonlocalized EXAM: CT ABDOMEN AND PELVIS WITH CONTRAST TECHNIQUE: Multidetector CT imaging of the abdomen and pelvis was performed using the standard protocol following bolus administration of intravenous contrast. RADIATION DOSE REDUCTION: This exam was performed according to the departmental dose-optimization program which includes automated exposure control, adjustment of the mA and/or kV according to patient size and/or use of iterative reconstruction technique. CONTRAST:  OMNIPAQUE IOHEXOL 300 MG/ML  SOLN COMPARISON:  CT lumbar spine 04/21/2019, CT abdomen pelvis 02/02/98 report without imaging  FINDINGS: Lower chest: No acute abnormality. Hepatobiliary: The hepatic parenchyma is diffusely hypodense compared to the splenic parenchyma consistent with fatty infiltration. No focal liver abnormality. No gallstones, gallbladder wall thickening, or pericholecystic fluid. No biliary dilatation. Pancreas: No focal lesion. Normal pancreatic contour. No surrounding inflammatory changes. No main pancreatic ductal dilatation. Spleen: Normal in size without focal abnormality. Adrenals/Urinary Tract: No adrenal nodule bilaterally. Bilateral kidneys enhance symmetrically. No hydronephrosis. No hydroureter. The urinary bladder is unremarkable. On delayed imaging, there is no urothelial wall thickening and there are no filling defects in the opacified portions of the bilateral collecting systems or ureters. Stomach/Bowel: Stomach is within normal limits. No evidence of bowel wall thickening or dilatation. The appendix is enlarged in caliber measuring up to 11 mm with associated appendiceal wall hyperemia. Associated mild periappendiceal fat stranding. No appendiceal wall discontinuity. No appendicolith. Vascular/Lymphatic: No abdominal aorta or iliac aneurysm. Mild atherosclerotic plaque of the aorta and its branches. No abdominal, pelvic, or inguinal lymphadenopathy. Reproductive: The prostate is enlarged measuring up to 5.2 cm. Other: no intraperitoneal free fluid. No intraperitoneal free gas. No organized fluid collection. Musculoskeletal: No abdominal wall hernia or abnormality. No suspicious lytic  or blastic osseous lesions. No acute displaced fracture. L5-S1 anterior and interbody surgical hardware. IMPRESSION: 1. Non-perforated acute appendicitis. 2. Prostatomegaly. 3.  Aortic Atherosclerosis (ICD10-I70.0). Electronically Signed   By: Tish Frederickson M.D.   On: 10/31/2022 00:07     A/P: Isaac Hall is an 52 y.o. male with acute appendicitis.  Plan for OR later today.  IV abx given.  NPO    Vanita Panda, MD  Colorectal and General Surgery [facility]  .  moderate decision making.

## 2022-10-31 NOTE — ED Notes (Signed)
ED TO INPATIENT HANDOFF REPORT  Name/Age/Gender Isaac Hall 52 y.o. male  Code Status    Code Status Orders  (From admission, onward)           Start     Ordered   10/31/22 0026  Full code  Continuous       Question:  By:  Answer:  Other   10/31/22 0030           Code Status History     Date Active Date Inactive Code Status Order ID Comments User Context   01/05/2019 1249 01/06/2019 1534 Full Code [id number]  Barnett Abu, MD Inpatient       Home/SNF/Other Home  Chief Complaint Acute appendicitis [K35.80]  Level of Care/Admitting Diagnosis ED Disposition     ED Disposition  Admit   Condition  --   Comment  Hospital Area: [facility] [[id number]]  Level of Care: Med-Surg [16]  May place patient in observation at [facility] or [facility] if equivalent level of care is available:: No  Covid Evaluation: Asymptomatic - no recent exposure (last 10 days) testing not required  Diagnosis: Acute appendicitis [474259]  Admitting Physician: CCS, MD [[id number]  Attending Physician: CCS, MD [[id number]]          Medical History Past Medical History:  Diagnosis Date   Acid reflux    Anxiety    Arthritis    Back pain    Diabetes mellitus without complication (HCC)    Hx of adenomatous polyp of colon 06/2012   Hypertension    Lower extremity pain 08/2018    Allergies No Known Allergies  IV Location/Drains/Wounds Patient Lines/Drains/Airways Status     Active Line/Drains/Airways     Name Placement date Placement time Site Days   Peripheral IV 10/30/22 20 G Anterior;Distal;Right;Upper Arm 10/30/22  2338  Arm  1   Incision (Closed) 01/05/19 Abdomen Other (Comment) 01/05/19  0931  -- 1395            Labs/Imaging Results for orders placed or performed during the hospital encounter of 10/30/22 (from the past 48 hour(s))  Urinalysis, Routine w reflex microscopic -Urine, Clean Catch     Status: Abnormal   Collection Time: 10/30/22   8:27 PM  Result Value Ref Range   Color, Urine YELLOW YELLOW   APPearance CLEAR CLEAR   Specific Gravity, Urine 1.020 1.005 - 1.030   pH 7.0 5.0 - 8.0   Glucose, UA NEGATIVE NEGATIVE mg/dL   Hgb urine dipstick SMALL (A) NEGATIVE   Bilirubin Urine NEGATIVE NEGATIVE   Ketones, ur NEGATIVE NEGATIVE mg/dL   Protein, ur NEGATIVE NEGATIVE mg/dL   Nitrite NEGATIVE NEGATIVE   Leukocytes,Ua NEGATIVE NEGATIVE   RBC / HPF 6-10 0 - 5 RBC/hpf   WBC, UA 0-5 0 - 5 WBC/hpf   Bacteria, UA NONE SEEN NONE SEEN   Squamous Epithelial / HPF 0-5 0 - 5 /HPF   Mucus PRESENT     Comment: Performed at [facility], [address], [place], [place] [address]  Comprehensive metabolic panel     Status: Abnormal   Collection Time: 10/30/22  8:40 PM  Result Value Ref Range   Sodium 135 135 - 145 mmol/L   Potassium 3.7 3.5 - 5.1 mmol/L   Chloride 103 98 - 111 mmol/L   CO2 22 22 - 32 mmol/L   Glucose, Bld 100 (H) 70 - 99 mg/dL    Comment: Glucose reference range applies only to samples taken  after fasting for at least 8 hours.   BUN 14 6 - 20 mg/dL   Creatinine, Ser 1.61 0.61 - 1.24 mg/dL   Calcium 9.3 8.9 - 09.6 mg/dL   Total Protein 7.6 6.5 - 8.1 g/dL   Albumin 4.6 3.5 - 5.0 g/dL   AST 27 15 - 41 U/L   ALT 32 0 - 44 U/L   Alkaline Phosphatase 72 38 - 126 U/L   Total Bilirubin 0.9 0.3 - 1.2 mg/dL   GFR, Estimated >04 >54 mL/min    Comment: (NOTE) Calculated using the CKD-EPI Creatinine Equation (2021)    Anion gap 10 5 - 15    Comment: Performed at [facility], [address], [place], [place] [address]  Lipase, blood     Status: None   Collection Time: 10/30/22  8:40 PM  Result Value Ref Range   Lipase 29 11 - 51 U/L    Comment: Performed at [facility], [address], [place], [place] [address]  CBC with Diff     Status: None   Collection Time: 10/30/22  8:40 PM  Result Value Ref Range   WBC 8.6 4.0 - 10.5 K/uL   RBC 5.05 4.22 - 5.81  MIL/uL   Hemoglobin 15.3 13.0 - 17.0 g/dL   HCT 29.5 62.1 - 30.8 %   MCV 89.5 80.0 - 100.0 fL   MCH 30.3 26.0 - 34.0 pg   MCHC 33.8 30.0 - 36.0 g/dL   RDW 65.7 84.6 - 96.2 %   Platelets 195 150 - 400 K/uL   nRBC 0.0 0.0 - 0.2 %   Neutrophils Relative % 66 %   Neutro Abs 5.7 1.7 - 7.7 K/uL   Lymphocytes Relative 25 %   Lymphs Abs 2.2 0.7 - 4.0 K/uL   Monocytes Relative 6 %   Monocytes Absolute 0.6 0.1 - 1.0 K/uL   Eosinophils Relative 2 %   Eosinophils Absolute 0.2 0.0 - 0.5 K/uL   Basophils Relative 1 %   Basophils Absolute 0.0 0.0 - 0.1 K/uL   Immature Granulocytes 0 %   Abs Immature Granulocytes 0.02 0.00 - 0.07 K/uL    Comment: Performed at [facility], [address], [place], [place] [address]   CT ABDOMEN PELVIS W CONTRAST  Result Date: 10/31/2022 CLINICAL DATA:  Abdominal pain, acute, nonlocalized EXAM: CT ABDOMEN AND PELVIS WITH CONTRAST TECHNIQUE: Multidetector CT imaging of the abdomen and pelvis was performed using the standard protocol following bolus administration of intravenous contrast. RADIATION DOSE REDUCTION: This exam was performed according to the departmental dose-optimization program which includes automated exposure control, adjustment of the mA and/or kV according to patient size and/or use of iterative reconstruction technique. CONTRAST:  OMNIPAQUE IOHEXOL 300 MG/ML  SOLN COMPARISON:  CT lumbar spine 04/21/2019, CT abdomen pelvis 02/02/98 report without imaging FINDINGS: Lower chest: No acute abnormality. Hepatobiliary: The hepatic parenchyma is diffusely hypodense compared to the splenic parenchyma consistent with fatty infiltration. No focal liver abnormality. No gallstones, gallbladder wall thickening, or pericholecystic fluid. No biliary dilatation. Pancreas: No focal lesion. Normal pancreatic contour. No surrounding inflammatory changes. No main pancreatic ductal dilatation. Spleen: Normal in size without focal abnormality.  Adrenals/Urinary Tract: No adrenal nodule bilaterally. Bilateral kidneys enhance symmetrically. No hydronephrosis. No hydroureter. The urinary bladder is unremarkable. On delayed imaging, there is no urothelial wall thickening and there are no filling defects in the opacified portions of the bilateral collecting systems or ureters. Stomach/Bowel: Stomach is within  normal limits. No evidence of bowel wall thickening or dilatation. The appendix is enlarged in caliber measuring up to 11 mm with associated appendiceal wall hyperemia. Associated mild periappendiceal fat stranding. No appendiceal wall discontinuity. No appendicolith. Vascular/Lymphatic: No abdominal aorta or iliac aneurysm. Mild atherosclerotic plaque of the aorta and its branches. No abdominal, pelvic, or inguinal lymphadenopathy. Reproductive: The prostate is enlarged measuring up to 5.2 cm. Other: no intraperitoneal free fluid. No intraperitoneal free gas. No organized fluid collection. Musculoskeletal: No abdominal wall hernia or abnormality. No suspicious lytic or blastic osseous lesions. No acute displaced fracture. L5-S1 anterior and interbody surgical hardware. IMPRESSION: 1. Non-perforated acute appendicitis. 2. Prostatomegaly. 3.  Aortic Atherosclerosis (ICD10-I70.0). Electronically Signed   By: Tish Frederickson M.D.   On: 10/31/2022 00:07    Pending Labs Unresulted Labs (From admission, onward)     Start     Ordered   10/31/22 0026  HIV Antibody (routine testing w rflx)  (HIV Antibody (Routine testing w reflex) panel)  Once,   R        10/31/22 0030            Vitals/Pain Today's Vitals   10/30/22 2016 10/30/22 2020 10/30/22 2355  BP:  128/88 125/83  Pulse:  82 83  Resp:  17 18  Temp:  98.4 F (36.9 C) 98.2 F (36.8 C)  TempSrc:  Oral Oral  SpO2:  100% 98%  Weight: 83 kg    Height: 5\' 4"  (1.626 m)    PainSc: 9       Isolation Precautions No active isolations  Medications Medications  cefTRIAXone (ROCEPHIN) 2 g  in sodium chloride 0.9 % 100 mL IVPB (2 g Intravenous New Bag/Given 10/31/22 0019)    And  metroNIDAZOLE (FLAGYL) IVPB 500 mg (500 mg Intravenous New Bag/Given 10/31/22 0019)  enoxaparin (LOVENOX) injection 30 mg (has no administration in time range)  dextrose 5 % and 0.45 % NaCl with KCl 20 mEq/L infusion (has no administration in time range)  acetaminophen (TYLENOL) tablet 1,000 mg (has no administration in time range)  oxyCODONE (Oxy IR/ROXICODONE) immediate release tablet 5-10 mg (has no administration in time range)  HYDROmorphone (DILAUDID) injection 0.5 mg (has no administration in time range)  diphenhydrAMINE (BENADRYL) capsule 25 mg (has no administration in time range)    Or  diphenhydrAMINE (BENADRYL) injection 25 mg (has no administration in time range)  docusate sodium (COLACE) capsule 100 mg (has no administration in time range)  ondansetron (ZOFRAN-ODT) disintegrating tablet 4 mg (has no administration in time range)    Or  ondansetron (ZOFRAN) injection 4 mg (has no administration in time range)  simethicone (MYLICON) chewable tablet 40 mg (has no administration in time range)  morphine (PF) 4 MG/ML injection 4 mg (4 mg Intravenous Given 10/30/22 2338)  ondansetron (ZOFRAN) injection 4 mg (4 mg Intravenous Given 10/30/22 2338)  iohexol (OMNIPAQUE) 300 MG/ML solution 100 mL (100 mLs Intravenous Contrast Given 10/30/22 2343)    Mobility walks with person assist

## 2022-10-31 NOTE — H&P (View-Only) (Signed)
Assessment & Plan: Acute appendicitis  NPO, IVF  IV abx  Plan OR this AM - lap appendectomy  Discussed with patient and wife at bedside.  Plan lap appendectomy, possible open.  Possible discharge home later today if patient does well.        Darnell Level, MD [facility] practice Office: [phone]        Chief Complaint: Abdominal pain  Subjective: Patient up in room, doing pre op skin care.  Wife at bedside  Objective: Vital signs in last 24 hours: Temp:  [98 F (36.7 C)-98.4 F (36.9 C)] 98 F (36.7 C) (08/15 0534) Pulse Rate:  [72-83] 72 (08/15 0534) Resp:  [16-18] 16 (08/15 0534) BP: (109-128)/(74-88) 109/74 (08/15 0534) SpO2:  [98 %-100 %] 100 % (08/15 0534) Weight:  [83 kg] 83 kg (08/14 2016) Last BM Date : 10/29/22  Intake/Output from previous day: 08/14 0701 - 08/15 0700 In: 429.6 [P.O.:60; I.V.:164.6; IV Piggyback:205] Out: -  Intake/Output this shift: No intake/output data recorded.  Physical Exam: HEENT - sclerae clear, mucous membranes moist Abd - non-distended  Lab Results:  Recent Labs    10/30/22 2040  WBC 8.6  HGB 15.3  HCT 45.2  PLT 195   BMET Recent Labs    10/30/22 2040  NA 135  K 3.7  CL 103  CO2 22  GLUCOSE 100*  BUN 14  CREATININE 0.64  CALCIUM 9.3   PT/INR No results for input(s): "LABPROT", "INR" in the last 72 hours. Comprehensive Metabolic Panel:    Component Value Date/Time   NA 135 10/30/2022 2040   NA 136 03/19/2022 0831   NA 139 10/18/2019 1220   NA 140 07/12/2019 1141   K 3.7 10/30/2022 2040   K 4.5 03/19/2022 0831   CL 103 10/30/2022 2040   CL 101 03/19/2022 0831   CO2 22 10/30/2022 2040   CO2 26 03/19/2022 0831   BUN 14 10/30/2022 2040   BUN 16 03/19/2022 0831   BUN 10 10/18/2019 1220   BUN 12 07/12/2019 1141   CREATININE 0.64 10/30/2022 2040   CREATININE 0.75 03/19/2022 0831   CREATININE 0.73 10/23/2015 0830   CREATININE 0.79 06/10/2015 0931   GLUCOSE 100 (H)  10/30/2022 2040   GLUCOSE 134 (H) 03/19/2022 0831   CALCIUM 9.3 10/30/2022 2040   CALCIUM 9.8 03/19/2022 0831   AST 27 10/30/2022 2040   AST 25 03/19/2022 0831   ALT 32 10/30/2022 2040   ALT 31 03/19/2022 0831   ALKPHOS 72 10/30/2022 2040   ALKPHOS 76 03/19/2022 0831   BILITOT 0.9 10/30/2022 2040   BILITOT 0.5 03/19/2022 0831   BILITOT 0.3 10/18/2019 1220   BILITOT 0.4 07/12/2019 1141   PROT 7.6 10/30/2022 2040   PROT 7.5 03/19/2022 0831   PROT 6.8 10/18/2019 1220   PROT 6.9 07/12/2019 1141   ALBUMIN 4.6 10/30/2022 2040   ALBUMIN 4.7 03/19/2022 0831   ALBUMIN 4.5 10/18/2019 1220   ALBUMIN 4.7 07/12/2019 1141    Studies/Results: CT ABDOMEN PELVIS W CONTRAST  Result Date: 10/31/2022 CLINICAL DATA:  Abdominal pain, acute, nonlocalized EXAM: CT ABDOMEN AND PELVIS WITH CONTRAST TECHNIQUE: Multidetector CT imaging of the abdomen and pelvis was performed using the standard protocol following bolus administration of intravenous contrast. RADIATION DOSE REDUCTION: This exam was performed according to the departmental dose-optimization program which includes automated exposure control, adjustment of the mA and/or kV according to patient size and/or use of iterative reconstruction technique. CONTRAST:  OMNIPAQUE  IOHEXOL 300 MG/ML  SOLN COMPARISON:  CT lumbar spine 04/21/2019, CT abdomen pelvis 02/02/98 report without imaging FINDINGS: Lower chest: No acute abnormality. Hepatobiliary: The hepatic parenchyma is diffusely hypodense compared to the splenic parenchyma consistent with fatty infiltration. No focal liver abnormality. No gallstones, gallbladder wall thickening, or pericholecystic fluid. No biliary dilatation. Pancreas: No focal lesion. Normal pancreatic contour. No surrounding inflammatory changes. No main pancreatic ductal dilatation. Spleen: Normal in size without focal abnormality. Adrenals/Urinary Tract: No adrenal nodule bilaterally. Bilateral kidneys enhance symmetrically. No  hydronephrosis. No hydroureter. The urinary bladder is unremarkable. On delayed imaging, there is no urothelial wall thickening and there are no filling defects in the opacified portions of the bilateral collecting systems or ureters. Stomach/Bowel: Stomach is within normal limits. No evidence of bowel wall thickening or dilatation. The appendix is enlarged in caliber measuring up to 11 mm with associated appendiceal wall hyperemia. Associated mild periappendiceal fat stranding. No appendiceal wall discontinuity. No appendicolith. Vascular/Lymphatic: No abdominal aorta or iliac aneurysm. Mild atherosclerotic plaque of the aorta and its branches. No abdominal, pelvic, or inguinal lymphadenopathy. Reproductive: The prostate is enlarged measuring up to 5.2 cm. Other: no intraperitoneal free fluid. No intraperitoneal free gas. No organized fluid collection. Musculoskeletal: No abdominal wall hernia or abnormality. No suspicious lytic or blastic osseous lesions. No acute displaced fracture. L5-S1 anterior and interbody surgical hardware. IMPRESSION: 1. Non-perforated acute appendicitis. 2. Prostatomegaly. 3.  Aortic Atherosclerosis (ICD10-I70.0). Electronically Signed   By: Tish Frederickson M.D.   On: 10/31/2022 00:07      Darnell Level 10/31/2022  Patient ID: Isaac Hall, male   DOB: [birth year], 52 y.o.   MRN: [id number]

## 2022-10-31 NOTE — Anesthesia Postprocedure Evaluation (Signed)
Anesthesia Post Note  Patient: Isaac Hall  Procedure(s) Performed: APPENDECTOMY LAPAROSCOPIC     Patient location during evaluation: PACU Anesthesia Type: General Level of consciousness: awake Pain management: pain level controlled Vital Signs Assessment: post-procedure vital signs reviewed and stable Respiratory status: spontaneous breathing, nonlabored ventilation and respiratory function stable Cardiovascular status: blood pressure returned to baseline and stable Postop Assessment: no apparent nausea or vomiting Anesthetic complications: no   No notable events documented.  Last Vitals:  Vitals:   10/31/22 1245 10/31/22 1314  BP: 105/68 108/76  Pulse: 91 90  Resp: 18 16  Temp:  36.6 C  SpO2: 98% 98%    Last Pain:  Vitals:   10/31/22 1314  TempSrc: Oral  PainSc: 7                   P 

## 2022-10-31 NOTE — Transfer of Care (Signed)
Immediate Anesthesia Transfer of Care Note  Patient: Isaac Hall  Procedure(s) Performed: APPENDECTOMY LAPAROSCOPIC  Patient Location: PACU  Anesthesia Type:General  Level of Consciousness: awake, alert , and oriented  Airway & Oxygen Therapy: Patient Spontanous Breathing and Patient connected to face mask oxygen  Post-op Assessment: Report given to RN and Post -op Vital signs reviewed and stable  Post vital signs: Reviewed and stable  Last Vitals:  Vitals Value Taken Time  BP 128/87 10/31/22 1123  Temp    Pulse 93 10/31/22 1126  Resp 13 10/31/22 1126  SpO2 93 % 10/31/22 1126  Vitals shown include unfiled device data.  Last Pain:  Vitals:   10/31/22 0847  TempSrc:   PainSc: 0-No pain      Patients Stated Pain Goal: 4 (10/31/22 0155)  Complications: No notable events documented.

## 2022-10-31 NOTE — Discharge Summary (Signed)
    Patient ID: Isaac Hall [id number] October 23, 1970 52 y.o.  Admit date: 10/30/2022 Discharge date: 10/31/2022  Admitting Diagnosis: Acute appendicitis  Discharge Diagnosis Patient Active Problem List   Diagnosis Date Noted   Acute appendicitis 10/31/2022   Viral illness 03/19/2022   Depression with anxiety 07/12/2019   Herniated nucleus pulposus, L5-S1, left 01/05/2019   Type 2 diabetes mellitus with hyperglycemia, without long-term current use of insulin (HCC) 07/04/2014    Consultants none  Reason for Admission: Isaac Hall is an 52 y.o. male who is here for RLQ pain that started last night.  Presented to urgent care today and subsequently sent to ED.  Pain started periumbilical then migrated to RLQ.  Associated with nausea.   Procedures Lap appy, Dr. Gerrit Friends 10/31/22  Hospital Course:  The patient was admitted and underwent a laparoscopic appendectomy.  The patient tolerated the procedure well.  On POD 0, the patient was tolerating a regular diet, voiding well, mobilizing, and pain was controlled with oral pain medications.  The patient was stable for DC home at this time with appropriate follow up made.   Allergies as of 10/31/2022   No Known Allergies      Medication List     TAKE these medications    acetaminophen 500 MG tablet Commonly known as: TYLENOL Take 2 tablets (1,000 mg total) by mouth every 6 (six) hours as needed.   atorvastatin 40 MG tablet Commonly known as: LIPITOR Take 1 tablet (40 mg total) by mouth daily.   citalopram 40 MG tablet Commonly known as: CELEXA TAKE 1 TABLET BY MOUTH EVERY DAY   ibuprofen 200 MG tablet Commonly known as: ADVIL Take 3 tablets (600 mg total) by mouth every 8 (eight) hours as needed for moderate pain. What changed:  how much to take when to take this   lisinopril 5 MG tablet Commonly known as: ZESTRIL TAKE 1 TABLET BY MOUTH EVERY DAY   metFORMIN 500 MG tablet Commonly known as:  GLUCOPHAGE Take 1 tablet (500 mg total) by mouth 2 (two) times daily with a meal.   oxyCODONE 5 MG immediate release tablet Commonly known as: Oxy IR/ROXICODONE Take 1 tablet (5 mg total) by mouth every 4 (four) hours as needed for moderate pain.   Trulicity 0.75 MG/0.5ML Sopn Generic drug: Dulaglutide Inject 0.75 mg into the skin once a week.          Follow-up Information     Maczis, Hedda Slade, [place]. Go on 11/21/2022.   Specialty: General Surgery Why: 3:15 PM. Arrive 30 minutes prior to your appointment time, Please bring your insurance card and photo ID Contact information: [address] [place] [address] [facility] [place] [address] [phone]                 Signed: Barnetta Chapel, [facility] 10/31/2022, 3:31 PM Please see Amion for pager number during day hours 7:00am-4:30pm, 7-11:30am on Weekends

## 2022-10-31 NOTE — Progress Notes (Signed)
Assessment & Plan: Acute appendicitis  NPO, IVF  IV abx  Plan OR this AM - lap appendectomy  Discussed with patient and wife at bedside.  Plan lap appendectomy, possible open.  Possible discharge home later today if patient does well.        Darnell Level, MD [facility] practice Office: [phone]        Chief Complaint: Abdominal pain  Subjective: Patient up in room, doing pre op skin care.  Wife at bedside  Objective: Vital signs in last 24 hours: Temp:  [98 F (36.7 C)-98.4 F (36.9 C)] 98 F (36.7 C) (08/15 0534) Pulse Rate:  [72-83] 72 (08/15 0534) Resp:  [16-18] 16 (08/15 0534) BP: (109-128)/(74-88) 109/74 (08/15 0534) SpO2:  [98 %-100 %] 100 % (08/15 0534) Weight:  [83 kg] 83 kg (08/14 2016) Last BM Date : 10/29/22  Intake/Output from previous day: 08/14 0701 - 08/15 0700 In: 429.6 [P.O.:60; I.V.:164.6; IV Piggyback:205] Out: -  Intake/Output this shift: No intake/output data recorded.  Physical Exam: HEENT - sclerae clear, mucous membranes moist Abd - non-distended  Lab Results:  Recent Labs    10/30/22 2040  WBC 8.6  HGB 15.3  HCT 45.2  PLT 195   BMET Recent Labs    10/30/22 2040  NA 135  K 3.7  CL 103  CO2 22  GLUCOSE 100*  BUN 14  CREATININE 0.64  CALCIUM 9.3   PT/INR No results for input(s): "LABPROT", "INR" in the last 72 hours. Comprehensive Metabolic Panel:    Component Value Date/Time   NA 135 10/30/2022 2040   NA 136 03/19/2022 0831   NA 139 10/18/2019 1220   NA 140 07/12/2019 1141   K 3.7 10/30/2022 2040   K 4.5 03/19/2022 0831   CL 103 10/30/2022 2040   CL 101 03/19/2022 0831   CO2 22 10/30/2022 2040   CO2 26 03/19/2022 0831   BUN 14 10/30/2022 2040   BUN 16 03/19/2022 0831   BUN 10 10/18/2019 1220   BUN 12 07/12/2019 1141   CREATININE 0.64 10/30/2022 2040   CREATININE 0.75 03/19/2022 0831   CREATININE 0.73 10/23/2015 0830   CREATININE 0.79 06/10/2015 0931   GLUCOSE 100 (H)  10/30/2022 2040   GLUCOSE 134 (H) 03/19/2022 0831   CALCIUM 9.3 10/30/2022 2040   CALCIUM 9.8 03/19/2022 0831   AST 27 10/30/2022 2040   AST 25 03/19/2022 0831   ALT 32 10/30/2022 2040   ALT 31 03/19/2022 0831   ALKPHOS 72 10/30/2022 2040   ALKPHOS 76 03/19/2022 0831   BILITOT 0.9 10/30/2022 2040   BILITOT 0.5 03/19/2022 0831   BILITOT 0.3 10/18/2019 1220   BILITOT 0.4 07/12/2019 1141   PROT 7.6 10/30/2022 2040   PROT 7.5 03/19/2022 0831   PROT 6.8 10/18/2019 1220   PROT 6.9 07/12/2019 1141   ALBUMIN 4.6 10/30/2022 2040   ALBUMIN 4.7 03/19/2022 0831   ALBUMIN 4.5 10/18/2019 1220   ALBUMIN 4.7 07/12/2019 1141    Studies/Results: CT ABDOMEN PELVIS W CONTRAST  Result Date: 10/31/2022 CLINICAL DATA:  Abdominal pain, acute, nonlocalized EXAM: CT ABDOMEN AND PELVIS WITH CONTRAST TECHNIQUE: Multidetector CT imaging of the abdomen and pelvis was performed using the standard protocol following bolus administration of intravenous contrast. RADIATION DOSE REDUCTION: This exam was performed according to the departmental dose-optimization program which includes automated exposure control, adjustment of the mA and/or kV according to patient size and/or use of iterative reconstruction technique. CONTRAST:  OMNIPAQUE  IOHEXOL 300 MG/ML  SOLN COMPARISON:  CT lumbar spine 04/21/2019, CT abdomen pelvis 02/02/98 report without imaging FINDINGS: Lower chest: No acute abnormality. Hepatobiliary: The hepatic parenchyma is diffusely hypodense compared to the splenic parenchyma consistent with fatty infiltration. No focal liver abnormality. No gallstones, gallbladder wall thickening, or pericholecystic fluid. No biliary dilatation. Pancreas: No focal lesion. Normal pancreatic contour. No surrounding inflammatory changes. No main pancreatic ductal dilatation. Spleen: Normal in size without focal abnormality. Adrenals/Urinary Tract: No adrenal nodule bilaterally. Bilateral kidneys enhance symmetrically. No  hydronephrosis. No hydroureter. The urinary bladder is unremarkable. On delayed imaging, there is no urothelial wall thickening and there are no filling defects in the opacified portions of the bilateral collecting systems or ureters. Stomach/Bowel: Stomach is within normal limits. No evidence of bowel wall thickening or dilatation. The appendix is enlarged in caliber measuring up to 11 mm with associated appendiceal wall hyperemia. Associated mild periappendiceal fat stranding. No appendiceal wall discontinuity. No appendicolith. Vascular/Lymphatic: No abdominal aorta or iliac aneurysm. Mild atherosclerotic plaque of the aorta and its branches. No abdominal, pelvic, or inguinal lymphadenopathy. Reproductive: The prostate is enlarged measuring up to 5.2 cm. Other: no intraperitoneal free fluid. No intraperitoneal free gas. No organized fluid collection. Musculoskeletal: No abdominal wall hernia or abnormality. No suspicious lytic or blastic osseous lesions. No acute displaced fracture. L5-S1 anterior and interbody surgical hardware. IMPRESSION: 1. Non-perforated acute appendicitis. 2. Prostatomegaly. 3.  Aortic Atherosclerosis (ICD10-I70.0). Electronically Signed   By: Tish Frederickson M.D.   On: 10/31/2022 00:07      Darnell Level 10/31/2022  Patient ID: Isaac Hall, male   DOB: [birth year], 52 y.o.   MRN: [id number]

## 2022-10-31 NOTE — Anesthesia Procedure Notes (Signed)
Procedure Name: Intubation Date/Time: 10/31/2022 10:41 AM  Performed by: Deri Fuelling, CRNAPre-anesthesia Checklist: Patient identified, Emergency Drugs available, Suction available and Patient being monitored Patient Re-evaluated:Patient Re-evaluated prior to induction Oxygen Delivery Method: Circle system utilized Preoxygenation: Pre-oxygenation with 100% oxygen Induction Type: IV induction Ventilation: Mask ventilation without difficulty Laryngoscope Size: McGraph and 4 Grade View: Grade II Tube type: Oral Tube size: 7.5 mm Number of attempts: 1 Airway Equipment and Method: Stylet and Oral airway Placement Confirmation: ETT inserted through vocal cords under direct vision, positive ETCO2 and breath sounds checked- equal and bilateral Secured at: 21 cm Tube secured with: Tape Dental Injury: Teeth and Oropharynx as per pre-operative assessment

## 2022-10-31 NOTE — Interval H&P Note (Signed)
History and Physical Interval Note:  10/31/2022 10:03 AM  Isaac Hall  has presented today for surgery, with the diagnosis of acute appendicitis.  The various methods of treatment have been discussed with the patient and family. After consideration of risks, benefits and other options for treatment, the patient has consented to    Procedure(s): APPENDECTOMY LAPAROSCOPIC (N/A) as a surgical intervention.    The patient's history has been reviewed, patient examined, no change in status, stable for surgery.  I have reviewed the patient's chart and labs.  Questions were answered to the patient's satisfaction.    Darnell Level, MD [facility] practice Office: [phone]   Darnell Level

## 2022-10-31 NOTE — Op Note (Signed)
OPERATIVE REPORT - LAPAROSCOPIC APPENDECTOMY  Preop diagnosis:  Acute appendicitis  Postop diagnosis:  same  Procedure:  Laparoscopic appendectomy  Surgeon:  Darnell Level, MD  Anesthesia:  general endotracheal  Estimated blood loss:  minimal  Preparation:  Chlora-prep  Complications:  none  Indications:  Isaac Hall is an 52 y.o. male who is here for RLQ pain that started last night.  Presented to urgent care today and subsequently sent to ED.  Pain started periumbilical then migrated to RLQ.  Associated with nausea. CT scan shows acute appendicitis without complications.  Patient now comes to surgery for appendectomy.  Procedure:  Patient was brought to the operating room and placed in a supine position on the operating room table. Following administration of general anesthesia, a time out was held and the patient's name and procedure was confirmed. Patient was then prepped and draped in the usual strict aseptic fashion.  After ascertaining that an adequate level of anesthesia had been achieved, a peri-umbilical incision was made with a #15 blade. Dissection was carried down to the fascia. Fascia was incised in the midline and the peritoneal cavity was entered cautiously. A #0-vicryl pursestring suture was placed in the fascia. An Hassan cannula was introduced under direct vision and secured with the pursestring suture. The abdomen was insufflated with carbon dioxide. The laparoscope was introduced and the abdomen was explored. Operative ports were placed in the right upper quadrant and left lower quadrant.  The appendix was acutely inflamed, turgid, but without evidence of perforation or abscess.  The mesoappendix was carefully divided under direct vision with the harmonic scalpel.  A window was made at the base of the appendix.  An Endo GIA stapler was used to transect the base of the appendix at its junction with the cecal wall.  There was good hemostasis and good approximation  along the staple line.  The remainder of the mesoappendix was then divided with the harmonic scalpel and the appendix was completely freed.  The appendix was placed into an endo-catch bag and withdrawn through the umbilical port. The #0-vicryl pursestring suture was tied securely.  Right lower quadrant was irrigated with warm saline which was evacuated. Good hemostasis was noted. Ports were removed under direct vision. Good hemostasis was noted at the port sites. Pneumoperitoneum was released.  Skin incisions were anesthetized with local anesthetic. Wounds were closed with interrupted 4-0 Monocryl subcuticular sutures. Wounds were washed and dried and Dermabond was applied. The patient was awakened from anesthesia and brought to the recovery room. The patient tolerated the procedure well.  Darnell Level, MD [facility] Office: [phone]

## 2022-10-31 NOTE — Progress Notes (Signed)
   10/31/22 1333  TOC Brief Assessment  Insurance and Status Reviewed  Patient has primary care physician Yes  Home environment has been reviewed home with spouse  Prior level of function: independent  Prior/Current Home Services No current home services  Social Determinants of Health Reivew SDOH reviewed no interventions necessary  Readmission risk has been reviewed Yes  Transition of care needs no transition of care needs at this time

## 2022-11-01 ENCOUNTER — Encounter (HOSPITAL_COMMUNITY): Payer: Self-pay | Admitting: Surgery

## 2022-11-01 LAB — SURGICAL PATHOLOGY

## 2022-11-06 ENCOUNTER — Encounter: Payer: Self-pay | Admitting: Internal Medicine

## 2022-11-14 ENCOUNTER — Other Ambulatory Visit (HOSPITAL_COMMUNITY): Payer: Self-pay

## 2022-11-14 ENCOUNTER — Telehealth: Payer: Self-pay

## 2022-11-14 NOTE — Telephone Encounter (Signed)
Pharmacy Patient Advocate Encounter   Received notification from CoverMyMeds that prior authorization for Trulicity 0.75MG /0.5ML  is required/requested.   Insurance verification completed.   The patient is insured through [employer] .   Per test claim: APPROVED from 10/15/22 to 11/14/23   [employer] Renewal Program

## 2023-02-06 ENCOUNTER — Other Ambulatory Visit: Payer: Self-pay | Admitting: Emergency Medicine

## 2023-02-06 DIAGNOSIS — E1165 Type 2 diabetes mellitus with hyperglycemia: Secondary | ICD-10-CM

## 2023-02-07 ENCOUNTER — Other Ambulatory Visit: Payer: Self-pay | Admitting: Radiology

## 2023-02-07 DIAGNOSIS — E1165 Type 2 diabetes mellitus with hyperglycemia: Secondary | ICD-10-CM

## 2023-02-07 MED ORDER — TRULICITY 0.75 MG/0.5ML ~~LOC~~ SOAJ
0.7500 mg | SUBCUTANEOUS | 3 refills | Status: DC
Start: 2023-02-07 — End: 2023-11-10

## 2023-05-06 ENCOUNTER — Other Ambulatory Visit: Payer: Self-pay | Admitting: Emergency Medicine

## 2023-05-06 DIAGNOSIS — E1165 Type 2 diabetes mellitus with hyperglycemia: Secondary | ICD-10-CM

## 2023-07-17 ENCOUNTER — Ambulatory Visit (INDEPENDENT_AMBULATORY_CARE_PROVIDER_SITE_OTHER): Admitting: Emergency Medicine

## 2023-07-17 ENCOUNTER — Encounter: Payer: Self-pay | Admitting: Emergency Medicine

## 2023-07-17 VITALS — BP 124/88 | HR 79 | Temp 98.3°F | Ht 64.0 in | Wt 184.0 lb

## 2023-07-17 DIAGNOSIS — Z7985 Long-term (current) use of injectable non-insulin antidiabetic drugs: Secondary | ICD-10-CM

## 2023-07-17 DIAGNOSIS — E1165 Type 2 diabetes mellitus with hyperglycemia: Secondary | ICD-10-CM | POA: Diagnosis not present

## 2023-07-17 DIAGNOSIS — Z7984 Long term (current) use of oral hypoglycemic drugs: Secondary | ICD-10-CM | POA: Diagnosis not present

## 2023-07-17 DIAGNOSIS — N50811 Right testicular pain: Secondary | ICD-10-CM | POA: Diagnosis not present

## 2023-07-17 LAB — LIPID PANEL
Cholesterol: 86 mg/dL (ref 0–200)
HDL: 31.1 mg/dL — ABNORMAL LOW (ref >39.00)
LDL Cholesterol: 34 mg/dL (ref 0–99)
NonHDL: 55.35
Total CHOL/HDL Ratio: 3
Triglycerides: 106 mg/dL (ref 0.0–149.0)
VLDL: 21.2 mg/dL (ref 0.0–40.0)

## 2023-07-17 LAB — COMPREHENSIVE METABOLIC PANEL WITH GFR
ALT: 23 U/L (ref 0–53)
AST: 21 U/L (ref 0–37)
Albumin: 4.5 g/dL (ref 3.5–5.2)
Alkaline Phosphatase: 72 U/L (ref 39–117)
BUN: 14 mg/dL (ref 6–23)
CO2: 26 meq/L (ref 19–32)
Calcium: 9.3 mg/dL (ref 8.4–10.5)
Chloride: 104 meq/L (ref 96–112)
Creatinine, Ser: 0.7 mg/dL (ref 0.40–1.50)
GFR: 105.54 mL/min (ref >60.00)
Glucose, Bld: 112 mg/dL — ABNORMAL HIGH (ref 70–99)
Potassium: 4.3 meq/L (ref 3.5–5.1)
Sodium: 136 meq/L (ref 135–145)
Total Bilirubin: 0.5 mg/dL (ref 0.2–1.2)
Total Protein: 7.3 g/dL (ref 6.0–8.3)

## 2023-07-17 LAB — URINALYSIS
Bilirubin Urine: NEGATIVE
Ketones, ur: NEGATIVE
Leukocytes,Ua: NEGATIVE
Nitrite: NEGATIVE
Specific Gravity, Urine: 1.01 (ref 1.000–1.030)
Total Protein, Urine: NEGATIVE
Urine Glucose: NEGATIVE
Urobilinogen, UA: 0.2 (ref 0.0–1.0)
pH: 6 (ref 5.0–8.0)

## 2023-07-17 LAB — MICROALBUMIN / CREATININE URINE RATIO
Creatinine,U: 78.5 mg/dL
Microalb Creat Ratio: UNDETERMINED mg/g (ref 0.0–30.0)
Microalb, Ur: <0.7 mg/dL

## 2023-07-17 LAB — CBC WITH DIFFERENTIAL/PLATELET
Basophils Absolute: 0 10*3/uL (ref 0.0–0.1)
Basophils Relative: 0.3 % (ref 0.0–3.0)
Eosinophils Absolute: 0.2 10*3/uL (ref 0.0–0.7)
Eosinophils Relative: 3 % (ref 0.0–5.0)
HCT: 44.9 % (ref 39.0–52.0)
Hemoglobin: 15.3 g/dL (ref 13.0–17.0)
Lymphocytes Relative: 28.3 % (ref 12.0–46.0)
Lymphs Abs: 1.7 10*3/uL (ref 0.7–4.0)
MCHC: 34.1 g/dL (ref 30.0–36.0)
MCV: 90.1 fl (ref 78.0–100.0)
Monocytes Absolute: 0.4 10*3/uL (ref 0.1–1.0)
Monocytes Relative: 6.7 % (ref 3.0–12.0)
Neutro Abs: 3.7 10*3/uL (ref 1.4–7.7)
Neutrophils Relative %: 61.7 % (ref 43.0–77.0)
Platelets: 196 10*3/uL (ref 150.0–400.0)
RBC: 4.98 Mil/uL (ref 4.22–5.81)
RDW: 12.9 % (ref 11.5–15.5)
WBC: 5.9 10*3/uL (ref 4.0–10.5)

## 2023-07-17 NOTE — Progress Notes (Deleted)
 Isaac Hall 53 y.o.   Chief Complaint  Patient presents with   Testicle Pain    Patient states he's been having pain since Monday. He states he felt it when he was bending over and gradually got worse through the day. He mainly feels it when he urinates. Today he is having body aches and still having pain in the right testicle     HISTORY OF PRESENT ILLNESS: This is a 53 y.o. male complaining of right testicular/groin pain that started on Monday.  Has been taking ibuprofen  with relief. Sometimes it hurts to urinate.  Denies gross hematuria.  Denies injury. Married.  Sexual intercourse last 2 months ago with wife. Diabetic doing well.  Not taking Jardiance or Farxiga.  On metformin  and Trulicity  Also takes lisinopril  and atorvastatin  No other complaints or medical concerns today.  Testicle Pain The patient's primary symptoms include testicular pain. Pertinent negatives include no abdominal pain, chest pain, chills, coughing, diarrhea, dysuria, fever, headaches, nausea, rash, shortness of breath, sore throat or vomiting.     Prior to Admission medications   Medication Sig Start Date End Date Taking? Authorizing Provider  acetaminophen  (TYLENOL ) 500 MG tablet Take 2 tablets (1,000 mg total) by mouth every 6 (six) hours as needed. 10/31/22  Yes Marlin Simmonds, PA-C  atorvastatin  (LIPITOR) 40 MG tablet TAKE 1 TABLET BY MOUTH EVERY DAY 05/06/23  Yes Sergio Zawislak Jose, MD  citalopram  (CELEXA ) 40 MG tablet TAKE 1 TABLET BY MOUTH EVERY DAY 09/15/22  Yes Lewanda Perea, Isidro Margo, MD  Dulaglutide  (TRULICITY ) 0.75 MG/0.5ML SOAJ Inject 0.75 mg into the skin once a week. 02/07/23  Yes Ethelmae Ringel, Isidro Margo, MD  ibuprofen  (ADVIL ) 200 MG tablet Take 3 tablets (600 mg total) by mouth every 8 (eight) hours as needed for moderate pain. 10/31/22  Yes Marlin Simmonds, PA-C  lisinopril  (ZESTRIL ) 5 MG tablet TAKE 1 TABLET BY MOUTH EVERY DAY 09/24/22  Yes Lowell Makara, Isidro Margo, MD  metFORMIN  (GLUCOPHAGE )  500 MG tablet TAKE 1 TABLET BY MOUTH 2 TIMES DAILY WITH A MEAL. 05/06/23  Yes Elvira Hammersmith, MD    No Known Allergies  Patient Active Problem List   Diagnosis Date Noted   Testicular pain, right 07/17/2023   Depression with anxiety 07/12/2019   Herniated nucleus pulposus, L5-S1, left 01/05/2019   Type 2 diabetes mellitus with hyperglycemia, without long-term current use of insulin  (HCC) 07/04/2014    Past Medical History:  Diagnosis Date   Acid reflux    Anxiety    Arthritis    Back pain    Diabetes mellitus without complication (HCC)    Hx of adenomatous polyp of colon 06/2012   Hypertension    Lower extremity pain 08/2018    Past Surgical History:  Procedure Laterality Date   ABDOMINAL EXPOSURE N/A 01/05/2019   Procedure: ABDOMINAL EXPOSURE;  Surgeon: Dannis Dy, MD;  Location: [facility] OR;  Service: Vascular;  Laterality: N/A;   ANTERIOR LUMBAR FUSION N/A 01/05/2019   Procedure: Lumbar five Sacral one Anterior lumbar interbody fusion;  Surgeon: Elna Haggis, MD;  Location: [facility] OR;  Service: Neurosurgery;  Laterality: N/A;   COLONOSCOPY W/ POLYPECTOMY  06/2012   LAPAROSCOPIC APPENDECTOMY N/A 10/31/2022   Procedure: APPENDECTOMY LAPAROSCOPIC;  Surgeon: Oralee Billow, MD;  Location: [facility] ORS;  Service: General;  Laterality: N/A;   VASECTOMY      Social History   Socioeconomic History   Marital status: Married    Spouse name: Mary   Number of children: 2   Years  of education: 12th grade   Highest education level: Not on file  Occupational History   Occupation: MACHINE OPERATOR    Employer: [employer]  Tobacco Use   Smoking status: Never   Smokeless tobacco: Never  Vaping Use   Vaping status: Never Used  Substance and Sexual Activity   Alcohol use: No   Drug use: No   Sexual activity: Not on file  Other Topics Concern   Not on file  Social History Narrative   Lives with his wife and their 2 children.   Originally from [place].  Came to the [place]   in [place].   Social Drivers of Corporate investment banker Strain: Not on file  Food Insecurity: No Food Insecurity (10/31/2022)   Hunger Vital Sign    Worried About Running Out of Food in the Last Year: Never true    Ran Out of Food in the Last Year: Never true  Transportation Needs: No Transportation Needs (10/31/2022)   PRAPARE - Administrator, Civil Service (Medical): No    Lack of Transportation (Non-Medical): No  Physical Activity: Not on file  Stress: Not on file  Social Connections: Unknown (07/31/2021)   [facility]   Social Network    Social Network: Not on file  Intimate Partner Violence: Not At Risk (10/31/2022)   Humiliation, Afraid, Rape, and Kick questionnaire    Fear of Current or Ex-Partner: No    Emotionally Abused: No    Physically Abused: No    Sexually Abused: No    Family History  Problem Relation Age of Onset   Cancer Mother    Diabetes Mother    Diabetes Father    Diabetes Sister    Diabetes Sister    Diabetes Sister    Diabetes Sister    Hypertension Sister    Colon cancer Neg Hx    Rectal cancer Neg Hx    Stomach cancer Neg Hx      Review of Systems  Constitutional: Negative.  Negative for chills and fever.  HENT: Negative.  Negative for congestion and sore throat.   Respiratory: Negative.  Negative for cough and shortness of breath.   Cardiovascular: Negative.  Negative for chest pain and palpitations.  Gastrointestinal:  Negative for abdominal pain, diarrhea, nausea and vomiting.  Genitourinary:  Positive for testicular pain. Negative for dysuria and hematuria.  Skin: Negative.  Negative for rash.  Neurological: Negative.  Negative for dizziness and headaches.  All other systems reviewed and are negative.   Today's Vitals   07/17/23 0825  BP: 124/88  Pulse: 79  Temp: 98.3 F (36.8 C)  TempSrc: Oral  SpO2: 96%  Weight: 184 lb (83.5 kg)  Height: 5\' 4"  (1.626 m)   Body mass index is 31.58  kg/m.   Physical Exam Vitals reviewed.  Constitutional:      Appearance: Normal appearance.  HENT:     Head: Normocephalic.     Mouth/Throat:     Mouth: Mucous membranes are moist.     Pharynx: Oropharynx is clear.  Eyes:     Extraocular Movements: Extraocular movements intact.  Cardiovascular:     Rate and Rhythm: Normal rate and regular rhythm.     Pulses: Normal pulses.     Heart sounds: Normal heart sounds.  Pulmonary:     Effort: Pulmonary effort is normal.     Breath sounds: Normal breath sounds.  Abdominal:     Palpations: Abdomen is soft.  Tenderness: There is no abdominal tenderness.     Hernia: There is no hernia in the right inguinal area.  Genitourinary:    Penis: Uncircumcised.      Testes:        Right: Tenderness present. Mass not present.        Left: Mass, tenderness or swelling not present.     Epididymis:     Right: Tenderness present.  Lymphadenopathy:     Lower Body: No right inguinal adenopathy.  Skin:    General: Skin is warm and dry.  Neurological:     Mental Status: He is alert and oriented to person, place, and time.  Psychiatric:        Mood and Affect: Mood normal.        Behavior: Behavior normal.     ASSESSMENT & PLAN: A total of 44 minutes was spent with the patient and counseling/coordination of care regarding preparing for this visit, review of most recent office visit notes, review of multiple chronic medical conditions and their management, differential diagnosis of testicular pain and need for workup, symptom management, review of all medications, review of most recent bloodwork results, review of health maintenance items, education on nutrition, prognosis, documentation, and need for follow up.   Problem List Items Addressed This Visit       Endocrine   Type 2 diabetes mellitus with hyperglycemia, without long-term current use of insulin  (HCC)   Well-controlled diabetes with hemoglobin A1c of 5.7 Cardiovascular risks  associated with diabetes discussed Diet and nutrition discussed. Continue metformin  500 mg twice a day and weekly Trulicity  0.75 mg Continue atorvastatin  40 mg daily and lisinopril  5 mg daily. Follow-up in 6 months.      Relevant Orders   Microalbumin / creatinine urine ratio   CBC with Differential/Platelet   Comprehensive metabolic panel with GFR   Lipid panel   Urinalysis   Urine Culture     Other   Testicular pain, right - Primary   Tender right testicle on examination Differential diagnosis discussed with patient Clinically stable.  No red flag signs or symptoms. No findings of testicular torsion Slowly improving over the last 48 hours Ibuprofen  helping Recommend testicular ultrasound today or ASAP Recommend blood work and urine testing today       Relevant Orders   US  SCROTUM W/DOPPLER   Patient Instructions  Hinchazn del escroto: qu debe saber Swelling in the Scrotum: What to Know  El escroto es el saco que contiene los [place], los vasos sanguneos y las estructuras que expulsan el esperma. En algunos casos, el escroto puede hincharse o agrandarse. Esto puede ocurrir en uno o ambos lados del escroto. Muchas cosas pueden causar hinchazn del escroto. Esto incluye lo siguiente: Un hidrocele. Esto es lquido alrededor del testculo. Una hernia. Esta es una zona dbil en los msculos de la zona de la ingle. Una vena alrededor del testculo que se ha agrandado demasiado. Una lesin o infeccin. Ciertos tratamientos, como una ciruga o un procedimiento en los genitales. Ciertas afecciones, como la insuficiencia cardaca. Torsin testicular. Se trata de una torsin del cordn que transporta el esperma al testculo. Cncer de testculo. Puede sentir dolor en el escroto, as como hinchazn. Siga estas instrucciones en su casa: Control del dolor, la rigidez y la hinchazn Use hielo o una bolsa de hielo como se lo hayan indicado. Ponga una toalla entre la piel y el  hielo. Place a towel between [your skin / your cast] and the bag. Aplique  el hielo durante 20 minutos, 2 a 3 veces por da. Retire el hielo si la piel se pone de color rojo brillante. Esto es [employer]. Si no puede sentir dolor, calor o fro, tiene un mayor riesgo de que se dae la zona. Si la piel se le pone de color rojo, quite el hielo de inmediato para evitar daos en la piel. El [place] de dao es mayor si no puede sentir dolor, Airline pilot o fro. Para obtener apoyo y comodidad: Coloque una toalla enrollada debajo de los testculos. Use ropa interior con bolsa de soporte. Use un protector deportivo. Use un suspensorio. Actividad Haga reposo segn las indicaciones que le hayan dado. Recustese siempre que pueda. Pregunte si puede levantar objetos. Evite el sexo hasta que el mdico lo autorice. Instrucciones generales Use los medicamentos nicamente segn las indicaciones. Hgase un autoexamen mensual del escroto y el pene. [place] si hay cambios. Concurra a todas las visitas de seguimiento. El mdico controlar cmo va sanando. Comunquese con un mdico si: Siente pesado el escroto o siente lquido en el interior. Siente dolor o ardor cuando orina. Tiene sangre en la orina o el semen. Siente un bulto alrededor del testculo. Ve que un testculo es mucho ms grande que el [place]. Siente un dolor repentino o un dolor sordo en la ingle o el escroto que no desaparece. Solicite ayuda de inmediato si: El dolor se hace muy intenso. Tiene fiebre o escalofros. Sigue vomitando. Uno o ambos lados del escroto se encuentran muy enrojecidos e hinchados. Observa un enrojecimiento que se extiende [place] desde el escroto, hacia el vientre, o hacia abajo desde el escroto, [employer]. Esta informacin no tiene Theme park manager el consejo del mdico. Asegrese de hacerle al mdico cualquier pregunta que tenga. Document Revised: 11/29/2022 Document Reviewed: 11/29/2022 Elsevier Patient  Education  2024 Elsevier Inc.     Maryagnes Small, MD [facility]

## 2023-07-17 NOTE — Patient Instructions (Signed)
 Hinchazn del escroto: qu debe saber Swelling in the Scrotum: What to Know  El escroto es el saco que contiene los [place], los vasos sanguneos y las estructuras que expulsan el esperma. En algunos casos, el escroto puede hincharse o agrandarse. Esto puede ocurrir en uno o ambos lados del escroto. Muchas cosas pueden causar hinchazn del escroto. Esto incluye lo siguiente: Un hidrocele. Esto es lquido alrededor del testculo. Una hernia. Esta es una zona dbil en los msculos de la zona de la ingle. Una vena alrededor del testculo que se ha agrandado demasiado. Una lesin o infeccin. Ciertos tratamientos, como una ciruga o un procedimiento en los genitales. Ciertas afecciones, como la insuficiencia cardaca. Torsin testicular. Se trata de una torsin del cordn que transporta el esperma al testculo. Cncer de testculo. Puede sentir dolor en el escroto, as como hinchazn. Siga estas instrucciones en su casa: Control del dolor, la rigidez y la hinchazn Use hielo o una bolsa de hielo como se lo hayan indicado. Ponga una toalla entre la piel y el hielo. Place a towel between [your skin / your cast] and the bag. Aplique el hielo durante 20 minutos, 2 a 3 veces por da. Retire el hielo si la piel se pone de color rojo brillante. Esto es [employer]. Si no puede sentir dolor, calor o fro, tiene un mayor riesgo de que se dae la zona. Si la piel se le pone de color rojo, quite el hielo de inmediato para evitar daos en la piel. El [place] de dao es mayor si no puede sentir dolor, Airline pilot o fro. Para obtener apoyo y comodidad: Coloque una toalla enrollada debajo de los testculos. Use ropa interior con bolsa de soporte. Use un protector deportivo. Use un suspensorio. Actividad Haga reposo segn las indicaciones que le hayan dado. Recustese siempre que pueda. Pregunte si puede levantar objetos. Evite el sexo hasta que el mdico lo autorice. Instrucciones generales Use los  medicamentos nicamente segn las indicaciones. Hgase un autoexamen mensual del escroto y el pene. [place] si hay cambios. Concurra a todas las visitas de seguimiento. El mdico controlar cmo va sanando. Comunquese con un mdico si: Siente pesado el escroto o siente lquido en el interior. Siente dolor o ardor cuando orina. Tiene sangre en la orina o el semen. Siente un bulto alrededor del testculo. Ve que un testculo es mucho ms grande que el [place]. Siente un dolor repentino o un dolor sordo en la ingle o el escroto que no desaparece. Solicite ayuda de inmediato si: El dolor se hace muy intenso. Tiene fiebre o escalofros. Sigue vomitando. Uno o ambos lados del escroto se encuentran muy enrojecidos e hinchados. Observa un enrojecimiento que se extiende [place] desde el escroto, hacia el vientre, o hacia abajo desde el escroto, [employer]. Esta informacin no tiene Theme park manager el consejo del mdico. Asegrese de hacerle al mdico cualquier pregunta que tenga. Document Revised: 11/29/2022 Document Reviewed: 11/29/2022 Elsevier Patient Education  2024 [employer].

## 2023-07-17 NOTE — Assessment & Plan Note (Signed)
 Well-controlled diabetes with hemoglobin A1c of 5.7 Cardiovascular risks associated with diabetes discussed Diet and nutrition discussed. Continue metformin  500 mg twice a day and weekly Trulicity  0.75 mg Continue atorvastatin  40 mg daily and lisinopril  5 mg daily. Follow-up in 6 months.

## 2023-07-17 NOTE — Progress Notes (Signed)
 Isaac Hall 53 y.o.   Chief Complaint  Patient presents with   Testicle Pain    Patient states he's been having pain since Monday. He states he felt it when he was bending over and gradually got worse through the day. He mainly feels it when he urinates. Today he is having body aches and still having pain in the right testicle     HISTORY OF PRESENT ILLNESS: This is a 53 y.o. male complaining of right testicular/groin pain that started on Monday.  Has been taking ibuprofen  with relief. Sometimes it hurts to urinate.  Denies gross hematuria.  Denies injury. Married.  Sexual intercourse last 2 months ago with wife. Diabetic doing well.  Not taking Jardiance or Farxiga.  On metformin  and Trulicity  Also takes lisinopril  and atorvastatin  No other complaints or medical concerns today.  Testicle Pain The patient's primary symptoms include testicular pain. Pertinent negatives include no abdominal pain, chest pain, chills, coughing, diarrhea, dysuria, fever, headaches, nausea, rash, shortness of breath, sore throat or vomiting.     Prior to Admission medications   Medication Sig Start Date End Date Taking? Authorizing Provider  acetaminophen  (TYLENOL ) 500 MG tablet Take 2 tablets (1,000 mg total) by mouth every 6 (six) hours as needed. 10/31/22  Yes Marlin Simmonds, PA-C  atorvastatin  (LIPITOR) 40 MG tablet TAKE 1 TABLET BY MOUTH EVERY DAY 05/06/23  Yes Sabriya Yono, Isidro Margo, MD  citalopram  (CELEXA ) 40 MG tablet TAKE 1 TABLET BY MOUTH EVERY DAY 09/15/22  Yes Shaday Rayborn, Isidro Margo, MD  Dulaglutide  (TRULICITY ) 0.75 MG/0.5ML SOAJ Inject 0.75 mg into the skin once a week. 02/07/23  Yes Warden Buffa, Isidro Margo, MD  ibuprofen  (ADVIL ) 200 MG tablet Take 3 tablets (600 mg total) by mouth every 8 (eight) hours as needed for moderate pain. 10/31/22  Yes Marlin Simmonds, PA-C  lisinopril  (ZESTRIL ) 5 MG tablet TAKE 1 TABLET BY MOUTH EVERY DAY 09/24/22  Yes Chue Berkovich, Isidro Margo, MD  metFORMIN  (GLUCOPHAGE )  500 MG tablet TAKE 1 TABLET BY MOUTH 2 TIMES DAILY WITH A MEAL. 05/06/23  Yes Elvira Hammersmith, MD    No Known Allergies  Patient Active Problem List   Diagnosis Date Noted   Depression with anxiety 07/12/2019   Herniated nucleus pulposus, L5-S1, left 01/05/2019   Type 2 diabetes mellitus with hyperglycemia, without long-term current use of insulin  (HCC) 07/04/2014    Past Medical History:  Diagnosis Date   Acid reflux    Anxiety    Arthritis    Back pain    Diabetes mellitus without complication (HCC)    Hx of adenomatous polyp of colon 06/2012   Hypertension    Lower extremity pain 08/2018    Past Surgical History:  Procedure Laterality Date   ABDOMINAL EXPOSURE N/A 01/05/2019   Procedure: ABDOMINAL EXPOSURE;  Surgeon: Dannis Dy, MD;  Location: [facility] OR;  Service: Vascular;  Laterality: N/A;   ANTERIOR LUMBAR FUSION N/A 01/05/2019   Procedure: Lumbar five Sacral one Anterior lumbar interbody fusion;  Surgeon: Elna Haggis, MD;  Location: [facility] OR;  Service: Neurosurgery;  Laterality: N/A;   COLONOSCOPY W/ POLYPECTOMY  06/2012   LAPAROSCOPIC APPENDECTOMY N/A 10/31/2022   Procedure: APPENDECTOMY LAPAROSCOPIC;  Surgeon: Oralee Billow, MD;  Location: [facility] ORS;  Service: General;  Laterality: N/A;   VASECTOMY      Social History   Socioeconomic History   Marital status: Married    Spouse name: Mary   Number of children: 2   Years of education: 12th grade  Highest education level: Not on file  Occupational History   Occupation: MACHINE OPERATOR    Employer: [employer]  Tobacco Use   Smoking status: Never   Smokeless tobacco: Never  Vaping Use   Vaping status: Never Used  Substance and Sexual Activity   Alcohol use: No   Drug use: No   Sexual activity: Not on file  Other Topics Concern   Not on file  Social History Narrative   Lives with his wife and their 2 children.   Originally from [place].  Came to the [place]  in [place].   Social Drivers of Manufacturing engineer Strain: Not on file  Food Insecurity: No Food Insecurity (10/31/2022)   Hunger Vital Sign    Worried About Running Out of Food in the Last Year: Never true    Ran Out of Food in the Last Year: Never true  Transportation Needs: No Transportation Needs (10/31/2022)   PRAPARE - Administrator, Civil Service (Medical): No    Lack of Transportation (Non-Medical): No  Physical Activity: Not on file  Stress: Not on file  Social Connections: Unknown (07/31/2021)   Received from [facility]   Social Network    Social Network: Not on file  Intimate Partner Violence: Not At Risk (10/31/2022)   Humiliation, Afraid, Rape, and Kick questionnaire    Fear of Current or Ex-Partner: No    Emotionally Abused: No    Physically Abused: No    Sexually Abused: No    Family History  Problem Relation Age of Onset   Cancer Mother    Diabetes Mother    Diabetes Father    Diabetes Sister    Diabetes Sister    Diabetes Sister    Diabetes Sister    Hypertension Sister    Colon cancer Neg Hx    Rectal cancer Neg Hx    Stomach cancer Neg Hx      Review of Systems  Constitutional: Negative.  Negative for chills and fever.  HENT: Negative.  Negative for congestion and sore throat.   Respiratory: Negative.  Negative for cough and shortness of breath.   Cardiovascular: Negative.  Negative for chest pain and palpitations.  Gastrointestinal:  Negative for abdominal pain, diarrhea, nausea and vomiting.  Genitourinary:  Positive for testicular pain. Negative for dysuria and hematuria.  Skin: Negative.  Negative for rash.  Neurological: Negative.  Negative for dizziness and headaches.  All other systems reviewed and are negative.  Today's Vitals   07/17/23 0825  BP: 124/88  Pulse: 79  Temp: 98.3 F (36.8 C)  TempSrc: Oral  SpO2: 96%  Weight: 184 lb (83.5 kg)  Height: 5\' 4"  (1.626 m)   Body mass index is 31.58 kg/m.    Physical Exam Vitals  reviewed.  Constitutional:      Appearance: Normal appearance.  HENT:     Head: Normocephalic.     Mouth/Throat:     Mouth: Mucous membranes are moist.     Pharynx: Oropharynx is clear.  Eyes:     Extraocular Movements: Extraocular movements intact.  Cardiovascular:     Rate and Rhythm: Normal rate and regular rhythm.     Pulses: Normal pulses.     Heart sounds: Normal heart sounds.  Pulmonary:     Effort: Pulmonary effort is normal.     Breath sounds: Normal breath sounds.  Abdominal:     Palpations: Abdomen is soft.     Tenderness: There is  no abdominal tenderness.     Hernia: There is no hernia in the right inguinal area.  Genitourinary:    Penis: Uncircumcised.      Testes:        Right: Tenderness present. Mass not present.        Left: Mass, tenderness or swelling not present.     Epididymis:     Right: Tenderness present.  Lymphadenopathy:     Lower Body: No right inguinal adenopathy.  Skin:    General: Skin is warm and dry.  Neurological:     Mental Status: He is alert and oriented to person, place, and time.  Psychiatric:        Mood and Affect: Mood normal.        Behavior: Behavior normal.     ASSESSMENT & PLAN: A total of 44 minutes was spent with the patient and counseling/coordination of care regarding preparing for this visit, review of most recent office visit notes, review of multiple chronic medical conditions and their management, differential diagnosis of testicular pain and need for workup, symptom management, review of all medications, review of most recent bloodwork results, review of health maintenance items, education on nutrition, prognosis, documentation, and need for follow up.   Problem List Items Addressed This Visit       Endocrine   Type 2 diabetes mellitus with hyperglycemia, without long-term current use of insulin  (HCC)   Well-controlled diabetes with hemoglobin A1c of 5.7 Cardiovascular risks associated with diabetes  discussed Diet and nutrition discussed. Continue metformin  500 mg twice a day and weekly Trulicity  0.75 mg Continue atorvastatin  40 mg daily and lisinopril  5 mg daily. Follow-up in 6 months.      Relevant Orders   Microalbumin / creatinine urine ratio   CBC with Differential/Platelet   Comprehensive metabolic panel with GFR   Lipid panel   Urinalysis   Urine Culture     Other   Testicular pain, right - Primary   Tender right testicle on examination Differential diagnosis discussed with patient Clinically stable.  No red flag signs or symptoms. No findings of testicular torsion Slowly improving over the last 48 hours Ibuprofen  helping Recommend testicular ultrasound today or ASAP Recommend blood work and urine testing today       Relevant Orders   US  SCROTUM W/DOPPLER   Patient Instructions  Hinchazn del escroto: qu debe saber Swelling in the Scrotum: What to Know  El escroto es el saco que contiene los [place], los vasos sanguneos y las estructuras que expulsan el esperma. En algunos casos, el escroto puede hincharse o agrandarse. Esto puede ocurrir en uno o ambos lados del escroto. Muchas cosas pueden causar hinchazn del escroto. Esto incluye lo siguiente: Un hidrocele. Esto es lquido alrededor del testculo. Una hernia. Esta es una zona dbil en los msculos de la zona de la ingle. Una vena alrededor del testculo que se ha agrandado demasiado. Una lesin o infeccin. Ciertos tratamientos, como una ciruga o un procedimiento en los genitales. Ciertas afecciones, como la insuficiencia cardaca. Torsin testicular. Se trata de una torsin del cordn que transporta el esperma al testculo. Cncer de testculo. Puede sentir dolor en el escroto, as como hinchazn. Siga estas instrucciones en su casa: Control del dolor, la rigidez y la hinchazn Use hielo o una bolsa de hielo como se lo hayan indicado. Ponga una toalla entre la piel y el hielo. Place a towel  between [your skin / your cast] and the bag. Aplique el hielo durante  20 minutos, 2 a 3 veces por da. Retire el hielo si la piel se pone de color rojo brillante. Esto es [employer]. Si no puede sentir dolor, calor o fro, tiene un mayor riesgo de que se dae la zona. Si la piel se le pone de color rojo, quite el hielo de inmediato para evitar daos en la piel. El [place] de dao es mayor si no puede sentir dolor, Airline pilot o fro. Para obtener apoyo y comodidad: Coloque una toalla enrollada debajo de los testculos. Use ropa interior con bolsa de soporte. Use un protector deportivo. Use un suspensorio. Actividad [place]aga reposo segn las indicaciones que le hayan dado. Recustese siempre que pueda. Pregunte si puede levantar objetos. Evite el sexo hasta que el mdico lo autorice. Instrucciones generales Use los medicamentos nicamente segn las indicaciones. Hgase un autoexamen mensual del escroto y el pene. [place] si hay cambios. Concurra a todas las visitas de seguimiento. El mdico controlar cmo va sanando. Comunquese con un mdico si: Siente pesado el escroto o siente lquido en el interior. Siente dolor o ardor cuando orina. Tiene sangre en la orina o el semen. Siente un bulto alrededor del testculo. Ve que un testculo es mucho ms grande que el [place]. Siente un dolor repentino o un dolor sordo en la ingle o el escroto que no desaparece. Solicite ayuda de inmediato si: El dolor se hace muy intenso. Tiene fiebre o escalofros. Sigue vomitando. Uno o ambos lados del escroto se encuentran muy enrojecidos e hinchados. Observa un enrojecimiento que se extiende [place] desde el escroto, hacia el vientre, o hacia abajo desde el escroto, [employer]. Esta informacin no tiene Theme park manager el consejo del mdico. Asegrese de hacerle al mdico cualquier pregunta que tenga. Document Revised: 11/29/2022 Document Reviewed: 11/29/2022 Elsevier Patient Education  2024 Elsevier  Inc.      Maryagnes Small, MD [facility]

## 2023-07-17 NOTE — Assessment & Plan Note (Addendum)
 Tender right testicle on examination Differential diagnosis discussed with patient Clinically stable.  No red flag signs or symptoms. No findings of testicular torsion Slowly improving over the last 48 hours Ibuprofen  helping Recommend testicular ultrasound today or ASAP Recommend blood work and urine testing today

## 2023-07-18 ENCOUNTER — Encounter: Payer: Self-pay | Admitting: Emergency Medicine

## 2023-07-18 ENCOUNTER — Ambulatory Visit
Admission: RE | Admit: 2023-07-18 | Discharge: 2023-07-18 | Disposition: A | Discharge status: Home / Self Care | Source: Ambulatory Visit | Attending: Emergency Medicine | Admitting: Emergency Medicine

## 2023-07-18 DIAGNOSIS — N50811 Right testicular pain: Secondary | ICD-10-CM | POA: Diagnosis not present

## 2023-07-18 LAB — URINE CULTURE: Result:: NO GROWTH

## 2023-07-18 NOTE — Telephone Encounter (Signed)
 Recommend improvements in nutrition and more physical exercise.  Decrease amount of daily carbohydrate intake and daily calories and increase amount of plant-based protein in your diet.

## 2023-07-23 ENCOUNTER — Other Ambulatory Visit: Payer: Self-pay | Admitting: Emergency Medicine

## 2023-07-23 DIAGNOSIS — E1165 Type 2 diabetes mellitus with hyperglycemia: Secondary | ICD-10-CM

## 2023-07-25 MED ORDER — LISINOPRIL 5 MG PO TABS: 5.0000 mg | ORAL_TABLET | Freq: Every day | ORAL | 0 refills | Status: DC

## 2023-09-24 ENCOUNTER — Encounter: Admitting: Emergency Medicine

## 2023-10-08 ENCOUNTER — Other Ambulatory Visit (HOSPITAL_COMMUNITY): Payer: Self-pay

## 2023-10-09 ENCOUNTER — Other Ambulatory Visit: Payer: Self-pay

## 2023-10-11 ENCOUNTER — Other Ambulatory Visit (HOSPITAL_COMMUNITY): Payer: Self-pay

## 2023-10-11 MED ORDER — ATORVASTATIN CALCIUM 40 MG PO TABS
40.0000 mg | ORAL_TABLET | Freq: Every day | ORAL | 3 refills | Status: DC
  Filled 2023-10-11 – 2023-10-16 (×2): qty 90, 90d supply, fill #0
  Filled 2024-01-31: qty 90, 90d supply, fill #1

## 2023-10-11 MED ORDER — METFORMIN HCL 500 MG PO TABS
500.0000 mg | ORAL_TABLET | Freq: Two times a day (BID) | ORAL | 3 refills | Status: AC
  Filled 2023-10-11 – 2023-10-16 (×2): qty 180, 90d supply, fill #0

## 2023-10-13 ENCOUNTER — Other Ambulatory Visit: Payer: Self-pay

## 2023-10-16 ENCOUNTER — Other Ambulatory Visit: Payer: Self-pay

## 2023-10-16 ENCOUNTER — Other Ambulatory Visit (HOSPITAL_COMMUNITY): Payer: Self-pay

## 2023-10-22 ENCOUNTER — Other Ambulatory Visit: Payer: Self-pay | Admitting: Emergency Medicine

## 2023-10-22 DIAGNOSIS — E1165 Type 2 diabetes mellitus with hyperglycemia: Secondary | ICD-10-CM

## 2023-11-05 ENCOUNTER — Other Ambulatory Visit (HOSPITAL_COMMUNITY): Payer: Self-pay

## 2023-11-07 ENCOUNTER — Other Ambulatory Visit (HOSPITAL_COMMUNITY): Payer: Self-pay

## 2023-11-10 ENCOUNTER — Other Ambulatory Visit (HOSPITAL_COMMUNITY): Payer: Self-pay

## 2023-11-10 ENCOUNTER — Other Ambulatory Visit: Payer: Self-pay | Admitting: Emergency Medicine

## 2023-11-10 DIAGNOSIS — E1165 Type 2 diabetes mellitus with hyperglycemia: Secondary | ICD-10-CM

## 2023-11-10 MED ORDER — TRULICITY 0.75 MG/0.5ML ~~LOC~~ SOAJ
0.7500 mg | SUBCUTANEOUS | 3 refills | Status: AC
  Filled 2023-11-10: qty 6, 84d supply, fill #0
  Filled 2024-01-31: qty 6, 84d supply, fill #1

## 2023-11-13 ENCOUNTER — Other Ambulatory Visit (HOSPITAL_COMMUNITY): Payer: Self-pay

## 2023-12-14 ENCOUNTER — Encounter: Payer: Self-pay | Admitting: Emergency Medicine

## 2023-12-19 ENCOUNTER — Other Ambulatory Visit: Payer: Self-pay | Admitting: Emergency Medicine

## 2023-12-19 DIAGNOSIS — F418 Other specified anxiety disorders: Secondary | ICD-10-CM

## 2023-12-19 DIAGNOSIS — E1165 Type 2 diabetes mellitus with hyperglycemia: Secondary | ICD-10-CM

## 2023-12-19 MED ORDER — LISINOPRIL 5 MG PO TABS
5.0000 mg | ORAL_TABLET | Freq: Every day | ORAL | 3 refills | Status: AC
  Filled 2024-01-13 – 2024-01-19 (×3): qty 90, 90d supply, fill #0
  Filled 2024-01-31 – 2024-03-06 (×3): qty 90, 90d supply, fill #1

## 2023-12-19 MED ORDER — CITALOPRAM HYDROBROMIDE 40 MG PO TABS
40.0000 mg | ORAL_TABLET | Freq: Every day | ORAL | 3 refills | Status: AC
Start: 2023-12-19 — End: ?

## 2023-12-19 NOTE — Telephone Encounter (Signed)
 Refills sent to pharmacy of record today.

## 2023-12-30 ENCOUNTER — Ambulatory Visit: Admitting: Emergency Medicine

## 2023-12-30 ENCOUNTER — Ambulatory Visit: Payer: Self-pay | Admitting: Emergency Medicine

## 2023-12-30 ENCOUNTER — Other Ambulatory Visit (HOSPITAL_COMMUNITY): Payer: Self-pay

## 2023-12-30 ENCOUNTER — Ambulatory Visit (INDEPENDENT_AMBULATORY_CARE_PROVIDER_SITE_OTHER)

## 2023-12-30 VITALS — BP 122/84 | HR 81 | Temp 98.1°F | Ht 64.0 in | Wt 181.0 lb

## 2023-12-30 DIAGNOSIS — E1165 Type 2 diabetes mellitus with hyperglycemia: Secondary | ICD-10-CM | POA: Diagnosis not present

## 2023-12-30 DIAGNOSIS — M25511 Pain in right shoulder: Secondary | ICD-10-CM | POA: Diagnosis not present

## 2023-12-30 DIAGNOSIS — M25512 Pain in left shoulder: Secondary | ICD-10-CM

## 2023-12-30 DIAGNOSIS — Z23 Encounter for immunization: Secondary | ICD-10-CM | POA: Diagnosis not present

## 2023-12-30 LAB — POCT GLYCOSYLATED HEMOGLOBIN (HGB A1C): Hemoglobin A1C: 5.9 % — AB (ref 4.0–5.6)

## 2023-12-30 MED ORDER — MELOXICAM 15 MG PO TABS
15.0000 mg | ORAL_TABLET | Freq: Every day | ORAL | 1 refills | Status: AC
  Filled 2023-12-30: qty 10, 10d supply, fill #0

## 2023-12-30 NOTE — Assessment & Plan Note (Signed)
 Well-controlled diabetes with hemoglobin A1c of 5.9 Cardiovascular risks associated with diabetes discussed Diet and nutrition discussed. Continue metformin  500 mg twice a day and weekly Trulicity  0.75 mg Continue atorvastatin  40 mg daily and lisinopril  5 mg daily. Follow-up in 6 months.

## 2023-12-30 NOTE — Patient Instructions (Signed)
 Shoulder Pain  Many things can cause shoulder pain, including:  An injury.  Moving the shoulder in the same way again and again (overuse).  Joint pain (arthritis).  Pain can come from:  Swelling and irritation (inflammation) of any part of the shoulder.  An injury to:  The shoulder joint.  Tissues that connect muscle to bone (tendons).  Tissues that connect bones to each other (ligaments).  Bones.  Follow these instructions at home:  Watch for changes in your symptoms. Let your doctor know about them. Follow these instructions to help with your pain.  If you have a sling that can be taken off:  Wear the sling as told by your doctor. Take it off only as told by your doctor.  Check the skin around the sling every day. Tell your doctor if you see problems.  Loosen the sling if your fingers:  Tingle.  Become numb.  Become cold.  Keep the sling clean.  If the sling is not waterproof:  Do not let it get wet.  Take the sling off when you shower or bathe.  Managing pain, stiffness, and swelling    If told, put ice on the painful area.  Put ice in a plastic bag.  Place a towel between your skin and the bag.  Leave the ice on for 20 minutes, 2-3 times a day. Stop putting ice on if it does not help with the pain.  If your skin turns bright red, take off the ice right away to prevent skin damage. The risk of damage is higher if you cannot feel pain, heat, or cold.  Squeeze a soft ball or a foam pad as much as possible. This prevents swelling in the shoulder. It also helps to strengthen the arm.  General instructions  Take over-the-counter and prescription medicines only as told by your doctor.  Keep all follow-up visits. This will help you avoid any type of permanent shoulder problems.  Contact a doctor if:  Your pain gets worse.  Medicine does not help your pain.  You have new pain in your arm, hand, or fingers.  You loosen your sling and your arm, hand, or fingers:  Tingle.  Are numb.  Are swollen.  Get help right away  if:  Your arm, hand, or fingers turn white or blue.  This information is not intended to replace advice given to you by your health care provider. Make sure you discuss any questions you have with your health care provider.  Document Revised: 10/05/2021 Document Reviewed: 10/05/2021  Elsevier Patient Education  2024 [employer].

## 2023-12-30 NOTE — Assessment & Plan Note (Signed)
 Turning into a chronic problem and affecting quality of life Affecting ability to perform work duties Recommend x-ray today. Needs orthopedic evaluation.  Referred to sports medicine clinic. Pain management discussed May take Tylenol .  Recommend daily meloxicam  15 mg

## 2023-12-30 NOTE — Progress Notes (Signed)
 Isaac Hall 53 y.o.   Chief Complaint  Patient presents with   Arm Pain    Patient states he just woke up to the pain in both shoulders more in left in the last couple of months, says with in the last week it has gotten worse hurts consistently . He has been taken ibuprofen  and states helps a little as well as a lidocaine  patch. Patient also mentions that he has been having a hard time sleeping for a while now.     HISTORY OF PRESENT ILLNESS: This is a 53 y.o. male complaining of bilateral shoulder pain that started about 2 months ago but worse the last couple weeks Physical job.  No other associated symptoms History of diabetes No other complaints or medical concerns today  Arm Pain  Pertinent negatives include no chest pain.     Prior to Admission medications   Medication Sig Start Date End Date Taking? Authorizing Provider  atorvastatin  (LIPITOR) 40 MG tablet Take 1 tablet (40 mg total) by mouth daily. 05/10/23  Yes Bayley Hurn, Emil Schanz, MD  citalopram  (CELEXA ) 40 MG tablet Take 1 tablet (40 mg total) by mouth daily. 12/19/23  Yes Tayshon Winker, Emil Schanz, MD  Dulaglutide  (TRULICITY ) 0.75 MG/0.5ML SOAJ Inject 0.75 mg into the skin once a week. 11/10/23  Yes Darchelle Nunes, Emil Schanz, MD  lisinopril  (ZESTRIL ) 5 MG tablet Take 1 tablet (5 mg total) by mouth daily. 12/19/23  Yes Dymond Gutt, Emil Schanz, MD  metFORMIN  (GLUCOPHAGE ) 500 MG tablet TAKE 1 TABLET BY MOUTH 2 TIMES DAILY WITH A MEAL. 05/06/23  Yes Kolston Lacount, Emil Schanz, MD  acetaminophen  (TYLENOL ) 500 MG tablet Take 2 tablets (1,000 mg total) by mouth every 6 (six) hours as needed. 10/31/22   Tammy Sor, PA-C  atorvastatin  (LIPITOR) 40 MG tablet TAKE 1 TABLET BY MOUTH EVERY DAY Patient not taking: Reported on 12/30/2023 05/06/23   Kathlean Cinco [place], MD  ibuprofen  (ADVIL ) 200 MG tablet Take 3 tablets (600 mg total) by mouth every 8 (eight) hours as needed for moderate pain. 10/31/22   Tammy Sor, PA-C  metFORMIN   (GLUCOPHAGE ) 500 MG tablet Take 1 tablet (500 mg total) by mouth 2 (two) times daily. Take with a meal. Patient not taking: Reported on 12/30/2023 05/10/23   Purcell Emil Schanz, MD    No Known Allergies  Patient Active Problem List   Diagnosis Date Noted   Testicular pain, right 07/17/2023   Depression with anxiety 07/12/2019   Herniated nucleus pulposus, L5-S1, left 01/05/2019   Type 2 diabetes mellitus with hyperglycemia, without long-term current use of insulin  (HCC) 07/04/2014    Past Medical History:  Diagnosis Date   Acid reflux    Anxiety    Arthritis    Back pain    Diabetes mellitus without complication (HCC)    Hx of adenomatous polyp of colon 06/2012   Hypertension    Lower extremity pain 08/2018    Past Surgical History:  Procedure Laterality Date   ABDOMINAL EXPOSURE N/A 01/05/2019   Procedure: ABDOMINAL EXPOSURE;  Surgeon: Eliza Lonni RAMAN, MD;  Location: [facility];  Service: Vascular;  Laterality: N/A;   ANTERIOR LUMBAR FUSION N/A 01/05/2019   Procedure: Lumbar five Sacral one Anterior lumbar interbody fusion;  Surgeon: Colon Shove, MD;  Location: [facility] OR;  Service: Neurosurgery;  Laterality: N/A;   COLONOSCOPY W/ POLYPECTOMY  06/2012   LAPAROSCOPIC APPENDECTOMY N/A 10/31/2022   Procedure: APPENDECTOMY LAPAROSCOPIC;  Surgeon: Eletha Boas, MD;  Location: [facility] ORS;  Service: General;  Laterality: N/A;  VASECTOMY      Social History   Socioeconomic History   Marital status: Married    Spouse name: Mary   Number of children: 2   Years of education: 12th grade   Highest education level: 9th grade  Occupational History   Occupation: MACHINE OPERATOR    Employer: [employer]  Tobacco Use   Smoking status: Never   Smokeless tobacco: Never  Vaping Use   Vaping status: Never Used  Substance and Sexual Activity   Alcohol use: No   Drug use: No   Sexual activity: Not on file  Other Topics Concern   Not on file  Social History Narrative   Lives  with his wife and their 2 children.   Originally from [place].  Came to [place]  in [place].   Social Drivers of Health   Financial Resource Strain: Medium Risk (12/30/2023)   Overall Financial Resource Strain (CARDIA)    Difficulty of Paying Living Expenses: Somewhat hard  Food Insecurity: Food Insecurity Present (12/30/2023)   Hunger Vital Sign    Worried About Running Out of Food in the Last Year: Sometimes true    Ran Out of Food in the Last Year: Never true  Transportation Needs: No Transportation Needs (12/30/2023)   PRAPARE - Administrator, Civil Service (Medical): No    Lack of Transportation (Non-Medical): No  Physical Activity: Insufficiently Active (12/30/2023)   Exercise Vital Sign    Days of Exercise per Week: 4 days    Minutes of Exercise per Session: 30 min  Stress: Stress Concern Present (12/30/2023)   [employer] of Occupational Health - Occupational Stress Questionnaire    Feeling of Stress: Rather much  Social Connections: Socially Isolated (12/30/2023)   Social Connection and Isolation Panel    Frequency of Communication with Friends and Family: Once a week    Frequency of Social Gatherings with Friends and Family: Once a week    Attends Religious Services: Never    Database administrator or Organizations: No    Attends Engineer, structural: Not on file    Marital Status: Married  Catering manager Violence: Not At Risk (10/31/2022)   Humiliation, Afraid, Rape, and Kick questionnaire    Fear of Current or Ex-Partner: No    Emotionally Abused: No    Physically Abused: No    Sexually Abused: No    Family History  Problem Relation Age of Onset   Cancer Mother    Diabetes Mother    Diabetes Father    Diabetes Sister    Diabetes Sister    Diabetes Sister    Diabetes Sister    Hypertension Sister    Colon cancer Neg Hx    Rectal cancer Neg Hx    Stomach cancer Neg Hx      Review of Systems  Constitutional: Negative.  Negative  for chills and fever.  HENT: Negative.  Negative for congestion and sore throat.   Respiratory: Negative.  Negative for cough and shortness of breath.   Cardiovascular: Negative.  Negative for chest pain and palpitations.  Gastrointestinal:  Negative for abdominal pain, diarrhea, nausea and vomiting.  Genitourinary: Negative.  Negative for dysuria and hematuria.  Musculoskeletal:  Positive for joint pain.  Skin: Negative.  Negative for rash.  Neurological: Negative.  Negative for dizziness and headaches.  All other systems reviewed and are negative.   Vitals:   12/30/23 0854  BP: 122/84  Pulse: 81  Temp: 98.1 F (36.7  C)  SpO2: 97%    Physical Exam Vitals reviewed.  Constitutional:      Appearance: Normal appearance.  HENT:     Head: Normocephalic.  Eyes:     Extraocular Movements: Extraocular movements intact.     Pupils: Pupils are equal, round, and reactive to light.  Cardiovascular:     Rate and Rhythm: Normal rate and regular rhythm.     Pulses: Normal pulses.     Heart sounds: Normal heart sounds.  Pulmonary:     Effort: Pulmonary effort is normal.     Breath sounds: Normal breath sounds.  Musculoskeletal:     Cervical back: No tenderness.     Comments: Left shoulder: Painful range of motion.  Some lateral tenderness Right shoulder: Painful range of motion less than left  Lymphadenopathy:     Cervical: No cervical adenopathy.  Skin:    General: Skin is warm and dry.     Capillary Refill: Capillary refill takes less than 2 seconds.  Neurological:     General: No focal deficit present.     Mental Status: He is alert and oriented to person, place, and time.  Psychiatric:        Mood and Affect: Mood normal.        Behavior: Behavior normal.    Results for orders placed or performed in visit on 12/30/23 (from the past 24 hours)  POCT HgB A1C     Status: Abnormal   Collection Time: 12/30/23  9:11 AM  Result Value Ref Range   Hemoglobin A1C 5.9 (A) 4.0 - 5.6  %   HbA1c POC (<> result, manual entry)     HbA1c, POC (prediabetic range)     HbA1c, POC (controlled diabetic range)        ASSESSMENT & PLAN: A total of 42 minutes was spent with the patient and counseling/coordination of care regarding preparing for this visit, review of most recent office visit notes, review of multiple chronic medical conditions and their management, cardiovascular risks associated with diabetes, differential diagnosis of shoulder pain and need for orthopedic evaluation, review of x-ray images done today, review of all medications, review of most recent bloodwork results including interpretation of today's hemoglobin A1c, review of health maintenance items, education on nutrition, prognosis, documentation, and need for follow up.   Problem List Items Addressed This Visit       Endocrine   Type 2 diabetes mellitus with hyperglycemia, without long-term current use of insulin  (HCC)   Well-controlled diabetes with hemoglobin A1c of 5.9 Cardiovascular risks associated with diabetes discussed Diet and nutrition discussed. Continue metformin  500 mg twice a day and weekly Trulicity  0.75 mg Continue atorvastatin  40 mg daily and lisinopril  5 mg daily. Follow-up in 6 months.      Relevant Orders   POCT HgB A1C (Completed)     Other   Acute pain of both shoulders - Primary   Turning into a chronic problem and affecting quality of life Affecting ability to perform work duties Recommend x-ray today. Needs orthopedic evaluation.  Referred to sports medicine clinic. Pain management discussed May take Tylenol .  Recommend daily meloxicam  15 mg      Relevant Medications   meloxicam  (MOBIC ) 15 MG tablet   Other Relevant Orders   Ambulatory referral to Sports Medicine   DG Shoulder Left   Other Visit Diagnoses       Need for vaccination       Relevant Medications   meloxicam  (MOBIC ) 15 MG tablet  Other Relevant Orders   Flu vaccine trivalent PF, 6mos and  older(Flulaval,Afluria,Fluarix,Fluzone)   Ambulatory referral to Sports Medicine      Patient Instructions  Shoulder Pain Many things can cause shoulder pain, including: An injury. Moving the shoulder in the same way again and again (overuse). Joint pain (arthritis). Pain can come from: Swelling and irritation (inflammation) of any part of the shoulder. An injury to: The shoulder joint. Tissues that connect muscle to bone (tendons). Tissues that connect bones to each other (ligaments). Bones. Follow these instructions at home: Watch for changes in your symptoms. Let your doctor know about them. Follow these instructions to help with your pain. If you have a sling that can be taken off: Wear the sling as told by your doctor. Take it off only as told by your doctor. Check the skin around the sling every day. Tell your doctor if you see problems. Loosen the sling if your fingers: Tingle. Become numb. Become cold. Keep the sling clean. If the sling is not waterproof: Do not let it get wet. Take the sling off when you shower or bathe. Managing pain, stiffness, and swelling  If told, put ice on the painful area. Put ice in a plastic bag. Place a towel between your skin and the bag. Leave the ice on for 20 minutes, 2-3 times a day. Stop putting ice on if it does not help with the pain. If your skin turns bright red, take off the ice right away to prevent skin damage. The risk of damage is higher if you cannot feel pain, heat, or cold. Squeeze a soft ball or a foam pad as much as possible. This prevents swelling in the shoulder. It also helps to strengthen the arm. General instructions Take over-the-counter and prescription medicines only as told by your doctor. Keep all follow-up visits. This will help you avoid any type of permanent shoulder problems. Contact a doctor if: Your pain gets worse. Medicine does not help your pain. You have new pain in your arm, hand, or  fingers. You loosen your sling and your arm, hand, or fingers: Tingle. Are numb. Are swollen. Get help right away if: Your arm, hand, or fingers turn white or blue. This information is not intended to replace advice given to you by your health care provider. Make sure you discuss any questions you have with your health care provider. Document Revised: 10/05/2021 Document Reviewed: 10/05/2021 Elsevier Patient Education  2024 Elsevier Inc.      Emil Schaumann, MD [facility]

## 2024-01-05 ENCOUNTER — Ambulatory Visit: Admitting: Family Medicine

## 2024-01-12 ENCOUNTER — Other Ambulatory Visit (HOSPITAL_COMMUNITY): Payer: Self-pay

## 2024-01-13 ENCOUNTER — Other Ambulatory Visit (HOSPITAL_COMMUNITY): Payer: Self-pay

## 2024-01-19 ENCOUNTER — Other Ambulatory Visit (HOSPITAL_COMMUNITY): Payer: Self-pay

## 2024-01-19 ENCOUNTER — Other Ambulatory Visit: Payer: Self-pay

## 2024-01-24 ENCOUNTER — Other Ambulatory Visit (HOSPITAL_COMMUNITY): Payer: Self-pay

## 2024-02-01 ENCOUNTER — Other Ambulatory Visit (HOSPITAL_COMMUNITY): Payer: Self-pay

## 2024-02-02 ENCOUNTER — Other Ambulatory Visit: Payer: Self-pay

## 2024-02-03 ENCOUNTER — Other Ambulatory Visit (HOSPITAL_COMMUNITY): Payer: Self-pay

## 2024-02-05 ENCOUNTER — Ambulatory Visit

## 2024-02-05 ENCOUNTER — Other Ambulatory Visit (HOSPITAL_COMMUNITY): Payer: Self-pay

## 2024-02-05 ENCOUNTER — Ambulatory Visit: Admitting: Family Medicine

## 2024-02-05 ENCOUNTER — Encounter: Payer: Self-pay | Admitting: Family Medicine

## 2024-02-05 VITALS — BP 130/84 | HR 87 | Ht 64.0 in | Wt 177.0 lb

## 2024-02-05 DIAGNOSIS — M47816 Spondylosis without myelopathy or radiculopathy, lumbar region: Secondary | ICD-10-CM | POA: Diagnosis not present

## 2024-02-05 DIAGNOSIS — M545 Low back pain, unspecified: Secondary | ICD-10-CM

## 2024-02-05 MED ORDER — CYCLOBENZAPRINE HCL 10 MG PO TABS: ORAL_TABLET | ORAL | 0 refills | Status: AC

## 2024-02-05 MED ORDER — TRAMADOL HCL 50 MG PO TABS: 50.0000 mg | ORAL_TABLET | Freq: Three times a day (TID) | ORAL | 0 refills | Status: AC | PRN

## 2024-02-05 NOTE — Patient Instructions (Addendum)
 Thank you for coming in today.   Please get an Xray today before you leave   I've referred you to Physical Therapy.  Let us  know if you don't hear from them in one week.   Let me know if you get worse.   Come back or go to the emergency room if you notice new weakness new numbness problems walking or bowel or bladder problems.

## 2024-02-05 NOTE — Progress Notes (Signed)
   I, Leotis Batter, CMA acting as a scribe for Artist Lloyd, MD.  Isaac Hall is a 53 y.o. male who presents to [facility] at 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 Pt locates pain to right side lower back. Radiating pain into gluteal region and leg, extending beyond the knee. Short-term relief with IBU and muscle relaxer.   Radiating pain: R LE LE numbness/tingling: denies LE weakness: denies Aggravates: sitting, laying Treatments tried: IBU, muscle relaxer  Dx testing: 04/21/19 L-spine CT myelogram  01/05/19 L-spine XR  Pertinent review of systems: No fevers or chills  Relevant historical information: History of lumbar surgery at [facility].   Exam:  BP 130/84   Pulse 87   Ht 5' 4 (1.626 m)   Wt 177 lb (80.3 kg)   SpO2 100%   BMI 30.38 kg/m  General: Well Developed, well nourished, and in no acute distress.   MSK: L-spine: Normal-appearing Nontender palpation spinal midline. Mildly tender palpation right lumbar paraspinal musculature. Decreased lumbar motion. Lower extremity strength is intact.    Lab and Radiology Results  X-ray images lumbar spine obtained today personally and independently interpreted. Lumbar fusion L5-S1.  No acute fractures. Await formal radiology review   Assessment and Plan: 53 y.o. male with right low back pain.  This is acute exacerbation of chronic pain.  Pain due to muscle spasm and dysfunction.  Will obtain x-ray and refer to physical therapy.  Prescribed cyclobenzaprine  and limited tramadol .  Discussed Tylenol  and ibuprofen .  Avoid high dose oral NSAIDs given blood pressure risk.   PDMP reviewed during this encounter. Orders Placed This Encounter  Procedures   DG Lumbar Spine 2-3 Views    Standing Status:   Future    Number of Occurrences:   1    Expiration Date:   02/04/2025    Reason for Exam (SYMPTOM  OR DIAGNOSIS REQUIRED):   Eval  low back pain    Preferred imaging location?:   [facility]   Ambulatory referral to Physical Therapy    Referral Priority:   Routine    Referral Type:   Physical Medicine    Referral Reason:   Specialty Services Required    Requested Specialty:   Physical Therapy    Number of Visits Requested:   1   Meds ordered this encounter  Medications   traMADol  (ULTRAM ) 50 MG tablet    Sig: Take 1 tablet (50 mg total) by mouth every 8 (eight) hours as needed for severe pain (pain score 7-10).    Dispense:  15 tablet    Refill:  0   cyclobenzaprine  (FLEXERIL ) 10 MG tablet    Sig: One half tab PO qHS, then increase gradually to one tab TID.    Dispense:  30 tablet    Refill:  0     Discussed warning signs or symptoms. Please see discharge instructions. Patient expresses understanding.   The above documentation has been reviewed and is accurate and complete Artist Lloyd, M.D.

## 2024-02-09 ENCOUNTER — Other Ambulatory Visit (HOSPITAL_COMMUNITY): Payer: Self-pay

## 2024-02-11 ENCOUNTER — Ambulatory Visit: Payer: Self-pay | Admitting: Family Medicine

## 2024-02-11 NOTE — Progress Notes (Signed)
 Low back x-ray shows some arthritis and prior surgery.

## 2024-02-17 ENCOUNTER — Other Ambulatory Visit (HOSPITAL_COMMUNITY): Payer: Self-pay

## 2024-02-18 ENCOUNTER — Other Ambulatory Visit (HOSPITAL_COMMUNITY): Payer: Self-pay

## 2024-02-18 MED ORDER — CITALOPRAM HYDROBROMIDE 40 MG PO TABS
40.0000 mg | ORAL_TABLET | Freq: Every day | ORAL | 2 refills | Status: AC
  Filled 2024-02-18: qty 90, 90d supply, fill #0

## 2024-02-24 ENCOUNTER — Other Ambulatory Visit (HOSPITAL_COMMUNITY): Payer: Self-pay

## 2024-02-24 NOTE — Therapy (Incomplete)
 OUTPATIENT PHYSICAL THERAPY THORACOLUMBAR EVALUATION   Patient Name: Isaac Hall MRN: [id number] DOB:Sep 13, 1970, 53 y.o., male Today's Date: 02/24/2024  END OF SESSION:   Past Medical History:  Diagnosis Date   Acid reflux    Anxiety    Arthritis    Back pain    Diabetes mellitus without complication (HCC)    Hx of adenomatous polyp of colon 06/2012   Hypertension    Lower extremity pain 08/2018   Past Surgical History:  Procedure Laterality Date   ABDOMINAL EXPOSURE N/A 01/05/2019   Procedure: ABDOMINAL EXPOSURE;  Surgeon: Eliza Lonni RAMAN, MD;  Location: [facility] OR;  Service: Vascular;  Laterality: N/A;   ANTERIOR LUMBAR FUSION N/A 01/05/2019   Procedure: Lumbar five Sacral one Anterior lumbar interbody fusion;  Surgeon: Colon Shove, MD;  Location: [facility] OR;  Service: Neurosurgery;  Laterality: N/A;   COLONOSCOPY W/ POLYPECTOMY  06/2012   LAPAROSCOPIC APPENDECTOMY N/A 10/31/2022   Procedure: APPENDECTOMY LAPAROSCOPIC;  Surgeon: Eletha Boas, MD;  Location: [facility] ORS;  Service: General;  Laterality: N/A;   VASECTOMY     Patient Active Problem List   Diagnosis Date Noted   Acute pain of both shoulders 12/30/2023   Depression with anxiety 07/12/2019   Herniated nucleus pulposus, L5-S1, left 01/05/2019   Type 2 diabetes mellitus with hyperglycemia, without long-term current use of insulin  (HCC) 07/04/2014    PCP: Emil Schaumann, MD   REFERRING PROVIDER: Artist Lloyd, MD   REFERRING DIAG: M54.50 (ICD-10-CM) - Acute right-sided low back pain without sciatica  Rationale for Evaluation and Treatment: Rehabilitation  THERAPY DIAG:  No diagnosis found.  ONSET DATE: ***  SUBJECTIVE:                                                                                                                                                                                           SUBJECTIVE STATEMENT: ***  PERTINENT HISTORY:  ***  PAIN:  Are you having pain? Yes: NPRS  scale: *** Pain location: *** Pain description: *** Aggravating factors: *** Relieving factors: ***  PRECAUTIONS: {Therapy precautions:24002}  RED FLAGS: {PT Red Flags:29287}   WEIGHT BEARING RESTRICTIONS: No  FALLS:  Has patient fallen in last 6 months? {fallsyesno:27318}  LIVING ENVIRONMENT: Lives with: {OPRC lives with:25569::lives with their family} Lives in: {Lives in:25570} Stairs: {opstairs:27293} Has following equipment at home: {Assistive devices:23999}  OCCUPATION: ***  PLOF: {PLOF:24004}  PATIENT GOALS: ***  NEXT MD VISIT: ***  OBJECTIVE:  Note: Objective measures were completed at Evaluation unless otherwise noted.  DIAGNOSTIC FINDINGS:  IMPRESSION: 1. No acute abnormality of the lumbar spine. 2. Minimal scoliosis with anterior fusion hardware at L5-S1.  3. Mild disc space narrowing at L2-L3 with multilevel mild degenerative osteophytes.  PATIENT SURVEYS:  PSFS: THE PATIENT SPECIFIC FUNCTIONAL SCALE  Place score of 0-10 (0 = unable to perform activity and 10 = able to perform activity at the same level as before injury or problem)  Activity Date: 02/25/2024         2.     3.     4.      Total Score ***      Total Score = Sum of activity scores/number of activities  Minimally Detectable Change: 3 points (for single activity); 2 points (for average score)  [facility] (nd). The Patient Specific Functional Scale . Retrieved from [email or web address]   COGNITION: Overall cognitive status: {cognition:24006}     SENSATION: {sensation:27233}  MUSCLE LENGTH: Hamstrings: Right *** deg; Left *** deg Debby test: Right *** deg; Left *** deg  POSTURE: {posture:25561}  PALPATION: ***  LUMBAR ROM:   AROM Eval 02/25/2024  Flexion   Extension   Right lateral flexion   Left lateral flexion   Right rotation   Left rotation    (Blank rows = not tested)  LOWER EXTREMITY ROM:      {AROM/PROM:27142}  Right Eval 02/25/2024 Left Eval 02/25/2024  Hip flexion    Hip extension    Hip abduction    Hip adduction    Hip internal rotation    Hip external rotation    Knee flexion    Knee extension    Ankle dorsiflexion    Ankle plantarflexion    Ankle inversion    Ankle eversion     (Blank rows = not tested)  LOWER EXTREMITY MMT:    MMT Right Eval 02/25/2024 Left Eval 02/25/2024  Hip flexion    Hip extension    Hip abduction    Hip adduction    Hip internal rotation    Hip external rotation    Knee flexion    Knee extension    Ankle dorsiflexion    Ankle plantarflexion    Ankle inversion    Ankle eversion     (Blank rows = not tested)  LUMBAR SPECIAL TESTS:  {lumbar special test:25242}  FUNCTIONAL TESTS:  {Functional tests:24029}  GAIT: Distance walked: *** Assistive device utilized: {Assistive devices:23999} Level of assistance: {Levels of assistance:24026} Comments: ***  TREATMENT DATE:  02/25/2024 TherEx:  HEP handout provided with patient performing one set of each activity for appropriate form. Verbal and tactile cues provided.   Self-Care:  POC, ***                                                                                                                                 PATIENT EDUCATION:  Education details: *** Person educated: {Person educated:25204} Education method: {Education Method:25205} Education comprehension: {Education Comprehension:25206}  HOME EXERCISE PROGRAM: ***  ASSESSMENT:  CLINICAL IMPRESSION: Patient is a 53 y.o. M who was seen today for physical  therapy evaluation and treatment for Rt side low back pain presenting with ***. Patient will benefit from skilled PT to address above noted deficits.   OBJECTIVE IMPAIRMENTS: {opptimpairments:25111}.   ACTIVITY LIMITATIONS: {activitylimitations:27494}  PARTICIPATION LIMITATIONS: {participationrestrictions:25113}  PERSONAL FACTORS: {Personal  factors:25162} are also affecting patient's functional outcome.   REHAB POTENTIAL: {rehabpotential:25112}  CLINICAL DECISION MAKING: {clinical decision making:25114}  EVALUATION COMPLEXITY: {Evaluation complexity:25115}   GOALS: Goals reviewed with patient? Yes  SHORT TERM GOALS: Target date: ***  Patient will show compliance with initial HEP. Baseline: Goal status: INITIAL  2.  Patient will report pain levels no greater than ***/10 in order to show an improved overall quality of life. Baseline:  Goal status: INITIAL  3.  *** Baseline:  Goal status: INITIAL  4.  *** Baseline:  Goal status: INITIAL  5.  *** Baseline:  Goal status: INITIAL  6.  *** Baseline:  Goal status: INITIAL  LONG TERM GOALS: Target date: ***  Patient will be independent with final HEP in order to maintain and progress upon functional gains made within PT. Baseline:  Goal status: INITIAL  2.  Patient will report pain levels no greater than ***/10 in order to show an improved overall quality of life. Baseline:  Goal status: INITIAL  3.  Patient will increase PSFS to at least *** in order to show a significant improvement in subjective disability rating. Baseline:  Goal status: INITIAL  4.  *** Baseline:  Goal status: INITIAL  5.  *** Baseline:  Goal status: INITIAL  6.  *** Baseline:  Goal status: INITIAL  PLAN:  PT FREQUENCY: {rehab frequency:25116}  PT DURATION: {rehab duration:25117}  PLANNED INTERVENTIONS: 97164- PT Re-evaluation, 97750- Physical Performance Testing, 97110-Therapeutic exercises, 97530- Therapeutic activity, [id number]- Neuromuscular re-education, 97535- Self Care, 02859- Manual therapy, [id number]- Gait training, [id number]- Orthotic Initial, [id number]- Orthotic/Prosthetic subsequent, [id number]- Canalith repositioning, [id number]- Aquatic Therapy, [email or web address]- Electrical stimulation (unattended), [phone]- Electrical stimulation (manual), [id number]- Vasopneumatic device, [id number]- Ultrasound, [id number]-  Traction (mechanical), [id number]- Ionotophoresis 4mg /ml Dexamethasone , 79439 (1-2 muscles), 20561 (3+ muscles)- Dry Needling, Patient/Family education, Balance training, Stair training, Taping, Joint mobilization, Joint manipulation, Spinal manipulation, Spinal mobilization, Vestibular training, DME instructions, Cryotherapy, and Moist heat.  PLAN FOR NEXT SESSION: review HEP, ***   Susannah Daring, PT, DPT 02/24/24 9:44 AM

## 2024-02-25 ENCOUNTER — Ambulatory Visit

## 2024-03-06 ENCOUNTER — Other Ambulatory Visit: Payer: Self-pay | Admitting: Emergency Medicine

## 2024-03-06 MED ORDER — ATORVASTATIN CALCIUM 40 MG PO TABS
40.0000 mg | ORAL_TABLET | Freq: Every day | ORAL | 3 refills | Status: AC
  Filled 2024-03-06: qty 90, 90d supply, fill #0

## 2024-03-08 ENCOUNTER — Other Ambulatory Visit (HOSPITAL_COMMUNITY): Payer: Self-pay

## 2024-06-29 ENCOUNTER — Ambulatory Visit: Admitting: Emergency Medicine
